# Patient Record
Sex: Female | Born: 1975 | Hispanic: No | Marital: Married | Smoking: Never smoker
Health system: Southern US, Community
[De-identification: ages and names within clinical notes are randomized; demographics above are authoritative.]

## PROBLEM LIST (undated history)

## (undated) ENCOUNTER — Emergency Department (HOSPITAL_COMMUNITY): Admission: EM | Payer: Self-pay | Source: Home / Self Care

## (undated) DIAGNOSIS — E669 Obesity, unspecified: Secondary | ICD-10-CM

## (undated) DIAGNOSIS — I10 Essential (primary) hypertension: Secondary | ICD-10-CM

## (undated) HISTORY — DX: Obesity, unspecified: E66.9

## (undated) HISTORY — PX: TUBAL LIGATION: SHX77

---

## 2001-02-19 ENCOUNTER — Other Ambulatory Visit: Admission: RE | Admit: 2001-02-19 | Discharge: 2001-02-19 | Payer: Self-pay | Admitting: Obstetrics and Gynecology

## 2001-03-11 ENCOUNTER — Other Ambulatory Visit: Admission: RE | Admit: 2001-03-11 | Discharge: 2001-03-11 | Payer: Self-pay | Admitting: Obstetrics and Gynecology

## 2009-08-26 HISTORY — PX: ACHILLES TENDON REPAIR: SUR1153

## 2015-07-05 ENCOUNTER — Encounter: Payer: Self-pay | Admitting: Emergency Medicine

## 2015-07-05 ENCOUNTER — Ambulatory Visit: Admission: EM | Admit: 2015-07-05 | Discharge: 2015-07-05 | Discharge status: Absent without leave | Payer: Self-pay

## 2015-07-05 ENCOUNTER — Ambulatory Visit (INDEPENDENT_AMBULATORY_CARE_PROVIDER_SITE_OTHER): Payer: Federal, State, Local not specified - PPO

## 2015-07-05 ENCOUNTER — Ambulatory Visit
Admission: EM | Admit: 2015-07-05 | Discharge: 2015-07-05 | Disposition: A | Discharge status: Home / Self Care | Payer: Federal, State, Local not specified - PPO | Attending: Family Medicine | Admitting: Family Medicine

## 2015-07-05 DIAGNOSIS — J189 Pneumonia, unspecified organism: Secondary | ICD-10-CM

## 2015-07-05 HISTORY — DX: Essential (primary) hypertension: I10

## 2015-07-05 MED ORDER — AZITHROMYCIN 250 MG PO TABS: ORAL_TABLET | ORAL | Status: DC

## 2015-07-05 MED ORDER — PREDNISONE 20 MG PO TABS
ORAL_TABLET | ORAL | Status: DC
Start: 2015-07-05 — End: 2016-05-06

## 2015-07-05 MED ORDER — HYDROCOD POLST-CPM POLST ER 10-8 MG/5ML PO SUER: 5.0000 mL | Freq: Two times a day (BID) | ORAL | Status: DC

## 2015-07-05 NOTE — Discharge Instructions (Signed)

## 2015-07-05 NOTE — ED Notes (Signed)
Patient states she has had a cough and congestion for more than a month.

## 2015-07-05 NOTE — ED Provider Notes (Signed)
CSN: [id number]     Arrival date & time 07/05/15  [id number] History   First MD Initiated Contact with Patient 07/05/15 1103     Chief Complaint  Patient presents with  . Cough   (Consider location/radiation/quality/duration/timing/severity/associated sxs/prior Treatment) HPI   This a 39 year old female who presents with a 6 week history of cough and congestion. States that he got cough began in early October lasted all October until the present time. Her 7782-month-old daughter was ill with the same but hers has persisted. He has been productive of yellow sputum. She said initially she had fever and chills but now the fever has not been prominent. 3 bottles of Robitussin DM and Alka-Seltzer cold which has provided her any relief. Not having any sinus congestion. Her congestion is all in her chest. The shortness of breath or any wheezing. Is not a smoker.  Past Medical History  Diagnosis Date  . Hypertension    Past Surgical History  Procedure Laterality Date  . Cesarean section     History reviewed. No pertinent family history. Social History  Substance Use Topics  . Smoking status: Never Smoker   . Smokeless tobacco: None  . Alcohol Use: Yes   OB History    No data available     Review of Systems  Constitutional: Positive for fever, chills and activity change. Negative for fatigue.  HENT: Negative for ear pain.   Respiratory: Positive for cough. Negative for shortness of breath, wheezing and stridor.   All other systems reviewed and are negative.   Allergies  Review of patient's allergies indicates not on file.  Home Medications   Prior to Admission medications   Medication Sig Start Date End Date Taking? Authorizing Provider  labetalol (NORMODYNE) 100 MG tablet Take 100 mg by mouth 2 (two) times daily.   Yes Historical Provider, MD  azithromycin (ZITHROMAX Z-PAK) 250 MG tablet Use as per package instructions 07/05/15   Lutricia FeilWilliam P Chiann Goffredo, PA-C  chlorpheniramine-HYDROcodone  [facility](TUSSIONEX PENNKINETIC ER) 10-8 MG/5ML SUER Take 5 mLs by mouth 2 (two) times daily. 07/05/15   Lutricia FeilWilliam P Lacosta Hargan, PA-C  predniSONE (DELTASONE) 20 MG tablet Take 2 tablets daily 07/05/15   Lutricia FeilWilliam P Yaminah Clayborn, PA-C   Meds Ordered and Administered this Visit  Medications - No data to display  BP 134/84 mmHg  Pulse 93  Temp(Src) 97.2 F (36.2 C) (Tympanic)  Resp 18  Ht 5\' 5"  (1.651 m)  Wt 240 lb (108.863 kg)  BMI 39.94 kg/m2  SpO2 99%  LMP 06/27/2015 (Exact Date) No data found.   Physical Exam  Constitutional: She is oriented to person, place, and time. She appears well-developed and well-nourished. No distress.  HENT:  Head: Normocephalic and atraumatic.  Nose: Nose normal.  Mouth/Throat: Oropharynx is clear and moist. No oropharyngeal exudate.  Both TMs are dull  Eyes: Pupils are equal, round, and reactive to light. Right eye exhibits no discharge. Left eye exhibits no discharge.  Neck: Neck supple.  Pulmonary/Chest: No respiratory distress. She has no wheezes. She has rales.  Examination of lungs shows good air entry and movement.. Is little central area of crackles in the right middle lobe. This is non-tussive. There is no rhonchi or wheezing present.  Musculoskeletal: Normal range of motion. She exhibits no edema or tenderness.  Lymphadenopathy:    She has no cervical adenopathy.  Neurological: She is alert and oriented to person, place, and time.  Skin: Skin is warm and dry. She is not diaphoretic.  Psychiatric: She has  a normal mood and affect. Her behavior is normal. Judgment and thought content normal.  Vitals reviewed.   ED Course  Procedures (including critical care time)  Labs Review Labs Reviewed - No data to display  Imaging Review Dg Chest 2 View  07/05/2015  CLINICAL DATA:  Productive cough for 1 month with right middle lobe crackles on exam EXAM: CHEST  2 VIEW COMPARISON:  None. FINDINGS: Normal heart size. Normal mediastinal contour. No pneumothorax. No  pleural effusion. No pulmonary edema. There is focal patchy opacity overlying the posterior heart on the lateral view, favor a mild right lower lobe opacity. IMPRESSION: Mild patchy right lower lobe opacity, which could indicate a mild pneumonia. Recommend follow-up PA and lateral post treatment chest radiographs in 6-8 weeks . Electronically Signed   By: Delbert Phenix M.D.   On: 07/05/2015 12:15     Visual Acuity Review  Right Eye Distance:   Left Eye Distance:   Bilateral Distance:    Right Eye Near:   Left Eye Near:    Bilateral Near:         MDM   1. Community acquired pneumonia    Discharge Medication List as of 07/05/2015 12:31 PM    START taking these medications   Details  azithromycin (ZITHROMAX Z-PAK) 250 MG tablet Use as per package instructions, Print    chlorpheniramine-HYDROcodone (TUSSIONEX PENNKINETIC ER) 10-8 MG/5ML SUER Take 5 mLs by mouth 2 (two) times daily., Starting 07/05/2015, Until Discontinued, Print    predniSONE (DELTASONE) 20 MG tablet Take 2 tablets daily, Print      Plan: 1. Test/x-ray results and diagnosis reviewed with patient 2. rx as per orders; risks, benefits, potential side effects reviewed with patient 3. Recommend supportive treatment with rest fluids 4. F/u with her PCP 6-8 weeks for repeat chest x-ray.  Lutricia Feil, PA-C 07/05/15 1237

## 2015-12-07 DIAGNOSIS — O10912 Unspecified pre-existing hypertension complicating pregnancy, second trimester: Secondary | ICD-10-CM | POA: Diagnosis not present

## 2015-12-07 DIAGNOSIS — O3412 Maternal care for benign tumor of corpus uteri, second trimester: Secondary | ICD-10-CM | POA: Diagnosis not present

## 2015-12-07 DIAGNOSIS — D259 Leiomyoma of uterus, unspecified: Secondary | ICD-10-CM | POA: Diagnosis not present

## 2015-12-07 DIAGNOSIS — O09522 Supervision of elderly multigravida, second trimester: Secondary | ICD-10-CM | POA: Diagnosis not present

## 2016-01-29 DIAGNOSIS — K080 Exfoliation of teeth due to systemic causes: Secondary | ICD-10-CM | POA: Diagnosis not present

## 2016-02-01 DIAGNOSIS — Z23 Encounter for immunization: Secondary | ICD-10-CM | POA: Diagnosis not present

## 2016-02-01 DIAGNOSIS — Z36 Encounter for antenatal screening of mother: Secondary | ICD-10-CM | POA: Diagnosis not present

## 2016-03-04 DIAGNOSIS — O358XX1 Maternal care for other (suspected) fetal abnormality and damage, fetus 1: Secondary | ICD-10-CM | POA: Diagnosis not present

## 2016-03-04 DIAGNOSIS — O09523 Supervision of elderly multigravida, third trimester: Secondary | ICD-10-CM | POA: Diagnosis not present

## 2016-03-04 DIAGNOSIS — O99213 Obesity complicating pregnancy, third trimester: Secondary | ICD-10-CM | POA: Diagnosis not present

## 2016-03-14 DIAGNOSIS — O10913 Unspecified pre-existing hypertension complicating pregnancy, third trimester: Secondary | ICD-10-CM | POA: Diagnosis not present

## 2016-03-21 DIAGNOSIS — O10913 Unspecified pre-existing hypertension complicating pregnancy, third trimester: Secondary | ICD-10-CM | POA: Diagnosis not present

## 2016-03-28 DIAGNOSIS — O10913 Unspecified pre-existing hypertension complicating pregnancy, third trimester: Secondary | ICD-10-CM | POA: Diagnosis not present

## 2016-03-28 DIAGNOSIS — Z36 Encounter for antenatal screening of mother: Secondary | ICD-10-CM | POA: Diagnosis not present

## 2016-04-04 DIAGNOSIS — Z36 Encounter for antenatal screening of mother: Secondary | ICD-10-CM | POA: Diagnosis not present

## 2016-04-04 DIAGNOSIS — O09523 Supervision of elderly multigravida, third trimester: Secondary | ICD-10-CM | POA: Diagnosis not present

## 2016-04-08 DIAGNOSIS — O99213 Obesity complicating pregnancy, third trimester: Secondary | ICD-10-CM | POA: Diagnosis not present

## 2016-04-08 DIAGNOSIS — O26893 Other specified pregnancy related conditions, third trimester: Secondary | ICD-10-CM | POA: Diagnosis not present

## 2016-04-08 DIAGNOSIS — O34219 Maternal care for unspecified type scar from previous cesarean delivery: Secondary | ICD-10-CM | POA: Diagnosis not present

## 2016-04-08 DIAGNOSIS — Z3A36 36 weeks gestation of pregnancy: Secondary | ICD-10-CM | POA: Diagnosis not present

## 2016-04-08 DIAGNOSIS — O368931 Maternal care for other specified fetal problems, third trimester, fetus 1: Secondary | ICD-10-CM | POA: Diagnosis not present

## 2016-04-08 DIAGNOSIS — O10913 Unspecified pre-existing hypertension complicating pregnancy, third trimester: Secondary | ICD-10-CM | POA: Diagnosis not present

## 2016-04-08 DIAGNOSIS — R109 Unspecified abdominal pain: Secondary | ICD-10-CM | POA: Diagnosis not present

## 2016-04-08 DIAGNOSIS — E669 Obesity, unspecified: Secondary | ICD-10-CM | POA: Diagnosis not present

## 2016-04-08 DIAGNOSIS — O09523 Supervision of elderly multigravida, third trimester: Secondary | ICD-10-CM | POA: Diagnosis not present

## 2016-04-08 DIAGNOSIS — Z6841 Body Mass Index (BMI) 40.0 and over, adult: Secondary | ICD-10-CM | POA: Diagnosis not present

## 2016-04-11 DIAGNOSIS — O10913 Unspecified pre-existing hypertension complicating pregnancy, third trimester: Secondary | ICD-10-CM | POA: Diagnosis not present

## 2016-04-15 DIAGNOSIS — O10913 Unspecified pre-existing hypertension complicating pregnancy, third trimester: Secondary | ICD-10-CM | POA: Diagnosis not present

## 2016-04-18 DIAGNOSIS — O10913 Unspecified pre-existing hypertension complicating pregnancy, third trimester: Secondary | ICD-10-CM | POA: Diagnosis not present

## 2016-04-23 DIAGNOSIS — Z302 Encounter for sterilization: Secondary | ICD-10-CM | POA: Diagnosis not present

## 2016-04-23 DIAGNOSIS — O09523 Supervision of elderly multigravida, third trimester: Secondary | ICD-10-CM | POA: Diagnosis not present

## 2016-04-23 DIAGNOSIS — R109 Unspecified abdominal pain: Secondary | ICD-10-CM | POA: Diagnosis not present

## 2016-04-23 DIAGNOSIS — Z79899 Other long term (current) drug therapy: Secondary | ICD-10-CM | POA: Diagnosis not present

## 2016-04-23 DIAGNOSIS — Z809 Family history of malignant neoplasm, unspecified: Secondary | ICD-10-CM | POA: Diagnosis not present

## 2016-04-23 DIAGNOSIS — Z833 Family history of diabetes mellitus: Secondary | ICD-10-CM | POA: Diagnosis not present

## 2016-04-23 DIAGNOSIS — Z3A38 38 weeks gestation of pregnancy: Secondary | ICD-10-CM | POA: Diagnosis not present

## 2016-04-23 DIAGNOSIS — Z7982 Long term (current) use of aspirin: Secondary | ICD-10-CM | POA: Diagnosis not present

## 2016-04-23 DIAGNOSIS — G8918 Other acute postprocedural pain: Secondary | ICD-10-CM | POA: Diagnosis not present

## 2016-04-23 DIAGNOSIS — Z803 Family history of malignant neoplasm of breast: Secondary | ICD-10-CM | POA: Diagnosis not present

## 2016-04-23 DIAGNOSIS — Z800 Family history of malignant neoplasm of digestive organs: Secondary | ICD-10-CM | POA: Diagnosis not present

## 2016-04-23 DIAGNOSIS — Z79891 Long term (current) use of opiate analgesic: Secondary | ICD-10-CM | POA: Diagnosis not present

## 2016-04-23 DIAGNOSIS — Z3A39 39 weeks gestation of pregnancy: Secondary | ICD-10-CM | POA: Diagnosis not present

## 2016-04-23 DIAGNOSIS — O99214 Obesity complicating childbirth: Secondary | ICD-10-CM | POA: Diagnosis not present

## 2016-04-23 DIAGNOSIS — Z8619 Personal history of other infectious and parasitic diseases: Secondary | ICD-10-CM | POA: Diagnosis not present

## 2016-04-23 DIAGNOSIS — O34211 Maternal care for low transverse scar from previous cesarean delivery: Secondary | ICD-10-CM | POA: Diagnosis not present

## 2016-04-23 DIAGNOSIS — Z8742 Personal history of other diseases of the female genital tract: Secondary | ICD-10-CM | POA: Diagnosis not present

## 2016-04-23 DIAGNOSIS — Z23 Encounter for immunization: Secondary | ICD-10-CM | POA: Diagnosis not present

## 2016-04-23 DIAGNOSIS — L813 Cafe au lait spots: Secondary | ICD-10-CM | POA: Diagnosis not present

## 2016-04-23 DIAGNOSIS — Z8249 Family history of ischemic heart disease and other diseases of the circulatory system: Secondary | ICD-10-CM | POA: Diagnosis not present

## 2016-04-23 DIAGNOSIS — O10913 Unspecified pre-existing hypertension complicating pregnancy, third trimester: Secondary | ICD-10-CM | POA: Diagnosis not present

## 2016-04-23 DIAGNOSIS — O1092 Unspecified pre-existing hypertension complicating childbirth: Secondary | ICD-10-CM | POA: Diagnosis not present

## 2016-04-25 DIAGNOSIS — Z302 Encounter for sterilization: Secondary | ICD-10-CM | POA: Diagnosis not present

## 2016-04-25 DIAGNOSIS — O10913 Unspecified pre-existing hypertension complicating pregnancy, third trimester: Secondary | ICD-10-CM | POA: Diagnosis not present

## 2016-04-25 DIAGNOSIS — O09523 Supervision of elderly multigravida, third trimester: Secondary | ICD-10-CM | POA: Diagnosis not present

## 2016-04-25 DIAGNOSIS — O34211 Maternal care for low transverse scar from previous cesarean delivery: Secondary | ICD-10-CM | POA: Diagnosis not present

## 2016-04-25 DIAGNOSIS — Z3A39 39 weeks gestation of pregnancy: Secondary | ICD-10-CM | POA: Diagnosis not present

## 2016-04-25 DIAGNOSIS — O99214 Obesity complicating childbirth: Secondary | ICD-10-CM | POA: Diagnosis not present

## 2016-04-26 DIAGNOSIS — G8918 Other acute postprocedural pain: Secondary | ICD-10-CM | POA: Diagnosis not present

## 2016-04-26 DIAGNOSIS — O1092 Unspecified pre-existing hypertension complicating childbirth: Secondary | ICD-10-CM | POA: Diagnosis not present

## 2016-04-26 DIAGNOSIS — O34211 Maternal care for low transverse scar from previous cesarean delivery: Secondary | ICD-10-CM | POA: Diagnosis not present

## 2016-04-26 DIAGNOSIS — Z8619 Personal history of other infectious and parasitic diseases: Secondary | ICD-10-CM | POA: Diagnosis not present

## 2016-04-26 DIAGNOSIS — R109 Unspecified abdominal pain: Secondary | ICD-10-CM | POA: Diagnosis not present

## 2016-05-02 DIAGNOSIS — M7989 Other specified soft tissue disorders: Secondary | ICD-10-CM | POA: Diagnosis not present

## 2016-05-02 DIAGNOSIS — Z9889 Other specified postprocedural states: Secondary | ICD-10-CM | POA: Diagnosis not present

## 2016-05-02 DIAGNOSIS — O99215 Obesity complicating the puerperium: Secondary | ICD-10-CM | POA: Diagnosis not present

## 2016-05-02 DIAGNOSIS — R51 Headache: Secondary | ICD-10-CM | POA: Diagnosis not present

## 2016-05-02 DIAGNOSIS — Z79899 Other long term (current) drug therapy: Secondary | ICD-10-CM | POA: Diagnosis not present

## 2016-05-02 DIAGNOSIS — O34219 Maternal care for unspecified type scar from previous cesarean delivery: Secondary | ICD-10-CM | POA: Diagnosis not present

## 2016-05-02 DIAGNOSIS — O152 Eclampsia in the puerperium: Secondary | ICD-10-CM | POA: Diagnosis not present

## 2016-05-02 DIAGNOSIS — I10 Essential (primary) hypertension: Secondary | ICD-10-CM | POA: Diagnosis not present

## 2016-05-02 DIAGNOSIS — O1415 Severe pre-eclampsia, complicating the puerperium: Secondary | ICD-10-CM | POA: Diagnosis not present

## 2016-05-02 DIAGNOSIS — I159 Secondary hypertension, unspecified: Secondary | ICD-10-CM | POA: Diagnosis not present

## 2016-05-03 DIAGNOSIS — O34219 Maternal care for unspecified type scar from previous cesarean delivery: Secondary | ICD-10-CM | POA: Diagnosis not present

## 2016-05-03 DIAGNOSIS — O1415 Severe pre-eclampsia, complicating the puerperium: Secondary | ICD-10-CM | POA: Diagnosis not present

## 2016-05-03 DIAGNOSIS — O99215 Obesity complicating the puerperium: Secondary | ICD-10-CM | POA: Diagnosis not present

## 2016-05-03 DIAGNOSIS — I10 Essential (primary) hypertension: Secondary | ICD-10-CM | POA: Diagnosis not present

## 2016-05-05 DIAGNOSIS — R030 Elevated blood-pressure reading, without diagnosis of hypertension: Secondary | ICD-10-CM | POA: Diagnosis not present

## 2016-05-06 ENCOUNTER — Emergency Department (HOSPITAL_COMMUNITY)
Admission: EM | Admit: 2016-05-06 | Discharge: 2016-05-06 | Disposition: A | Discharge status: Home / Self Care | Payer: Federal, State, Local not specified - PPO | Attending: Emergency Medicine | Admitting: Emergency Medicine

## 2016-05-06 ENCOUNTER — Encounter (HOSPITAL_COMMUNITY): Payer: Self-pay | Admitting: *Deleted

## 2016-05-06 DIAGNOSIS — R791 Abnormal coagulation profile: Secondary | ICD-10-CM | POA: Diagnosis not present

## 2016-05-06 DIAGNOSIS — Z79899 Other long term (current) drug therapy: Secondary | ICD-10-CM | POA: Insufficient documentation

## 2016-05-06 DIAGNOSIS — O165 Unspecified maternal hypertension, complicating the puerperium: Secondary | ICD-10-CM | POA: Insufficient documentation

## 2016-05-06 DIAGNOSIS — I10 Essential (primary) hypertension: Secondary | ICD-10-CM | POA: Diagnosis not present

## 2016-05-06 LAB — URINALYSIS, ROUTINE W REFLEX MICROSCOPIC
Bilirubin Urine: NEGATIVE
GLUCOSE, UA: NEGATIVE mg/dL
Ketones, ur: NEGATIVE mg/dL
Nitrite: NEGATIVE
PROTEIN: NEGATIVE mg/dL
SPECIFIC GRAVITY, URINE: 1.012 (ref 1.005–1.030)
pH: 6 (ref 5.0–8.0)

## 2016-05-06 LAB — CBC WITH DIFFERENTIAL/PLATELET
BASOS PCT: 0 %
Basophils Absolute: 0 10*3/uL (ref 0.0–0.1)
EOS ABS: 0.2 10*3/uL (ref 0.0–0.7)
Eosinophils Relative: 2 %
HCT: 32 % — ABNORMAL LOW (ref 36.0–46.0)
HEMOGLOBIN: 10.1 g/dL — AB (ref 12.0–15.0)
Lymphocytes Relative: 31 %
Lymphs Abs: 2.6 10*3/uL (ref 0.7–4.0)
MCH: 28.1 pg (ref 26.0–34.0)
MCHC: 31.6 g/dL (ref 30.0–36.0)
MCV: 88.9 fL (ref 78.0–100.0)
Monocytes Absolute: 0.6 10*3/uL (ref 0.1–1.0)
Monocytes Relative: 7 %
NEUTROS PCT: 60 %
Neutro Abs: 5.1 10*3/uL (ref 1.7–7.7)
Platelets: 446 10*3/uL — ABNORMAL HIGH (ref 150–400)
RBC: 3.6 MIL/uL — AB (ref 3.87–5.11)
RDW: 14.8 % (ref 11.5–15.5)
WBC: 8.5 10*3/uL (ref 4.0–10.5)

## 2016-05-06 LAB — PROTEIN / CREATININE RATIO, URINE
CREATININE, URINE: 52.83 mg/dL
Protein Creatinine Ratio: 0.11 mg/mg{Cre} (ref 0.00–0.15)
Total Protein, Urine: 6 mg/dL

## 2016-05-06 LAB — URINE MICROSCOPIC-ADD ON

## 2016-05-06 LAB — PROTIME-INR
INR: 1.11
PROTHROMBIN TIME: 14.4 s (ref 11.4–15.2)

## 2016-05-06 LAB — COMPREHENSIVE METABOLIC PANEL
ALK PHOS: 75 U/L (ref 38–126)
ALT: 23 U/L (ref 14–54)
AST: 21 U/L (ref 15–41)
Albumin: 3.1 g/dL — ABNORMAL LOW (ref 3.5–5.0)
Anion gap: 7 (ref 5–15)
BUN: 15 mg/dL (ref 6–20)
CALCIUM: 8.7 mg/dL — AB (ref 8.9–10.3)
CO2: 22 mmol/L (ref 22–32)
CREATININE: 0.82 mg/dL (ref 0.44–1.00)
Chloride: 110 mmol/L (ref 101–111)
Glucose, Bld: 96 mg/dL (ref 65–99)
Potassium: 4.2 mmol/L (ref 3.5–5.1)
Sodium: 139 mmol/L (ref 135–145)
Total Bilirubin: 0.5 mg/dL (ref 0.3–1.2)
Total Protein: 6.2 g/dL — ABNORMAL LOW (ref 6.5–8.1)

## 2016-05-06 LAB — LACTATE DEHYDROGENASE: LDH: 203 U/L — AB (ref 98–192)

## 2016-05-06 LAB — APTT: APTT: 29 s (ref 24–36)

## 2016-05-06 MED ORDER — AMLODIPINE BESYLATE 10 MG PO TABS
10.0000 mg | ORAL_TABLET | Freq: Every day | ORAL | 0 refills | Status: DC
Start: 2016-05-06 — End: 2018-07-09

## 2016-05-06 MED ORDER — LABETALOL HCL 200 MG PO TABS
200.0000 mg | ORAL_TABLET | Freq: Once | ORAL | Status: AC
  Administered 2016-05-06: 200 mg via ORAL
  Filled 2016-05-06: qty 1

## 2016-05-06 MED ORDER — AMLODIPINE BESYLATE 5 MG PO TABS
10.0000 mg | ORAL_TABLET | Freq: Once | ORAL | Status: AC
  Administered 2016-05-06: 10 mg via ORAL
  Filled 2016-05-06: qty 2

## 2016-05-06 MED ORDER — HYDRALAZINE HCL 20 MG/ML IJ SOLN
5.0000 mg | Freq: Once | INTRAMUSCULAR | Status: AC
  Administered 2016-05-06: 5 mg via INTRAVENOUS
  Filled 2016-05-06: qty 1

## 2016-05-06 NOTE — Discharge Instructions (Signed)
The OB on call, Dr Emelda FearFerguson, wants to add Norvasc to your blood pressure regimen. Recheck if you get a headache, chest pain, get short of breath or get swelling in your legs.  Try to get a primary care doctor, until then [place]

## 2016-05-06 NOTE — ED Provider Notes (Signed)
[facility]-EMERGENCY DEPT Provider Note   CSN: [id number] Arrival date & time: 05/06/16  0100  By signing my name below, I, Nelwyn SalisburyJoshua Fowler, attest that this documentation has been prepared under the direction and in the presence of Devoria AlbeIva Anastacio Bua, MD . Electronically Signed: Nelwyn SalisburyJoshua Fowler, Scribe. 05/06/2016. 1:14 AM.  Time seen 01:12 AM  History   Chief Complaint Chief Complaint  Patient presents with  . Hypertension   The history is provided by the patient. No language interpreter was used.     HPI Comments:  Mack HookLaquetta D Jones Bigelow is a 40 y.o. female who presents to the Emergency Department complaining of chronic  hypertension that was well controlled during her first pregnancy and her second pregnancy..Pt reports that she gave birth two weeks ago (04/25/2016) by c-section and everything was okay. She was discharged home with her baby and Sept 7  began experiencing severe headaches  She checked her BP and it was high at 170/106 and she had protein in her urine and she was admitted for pre-eclampsia post delivery.  She was treated with IV magnesium from Friday 12 AM through Saturday morning AM (24 hrs). Her labetolol was increased from 100 mg BID to 200 mg BID and her BP was better in the hospital at 140-150 systolic over 75, which was higher than her baseline.  Pt was discharged yesterday, Sept 10  around 1pm. Pt reports that she came home, relaxed, and took her labetalol at about 9pm. She then took her blood pressure and found it to be 190/106. Pt called EMS to confirm the high BP before coming into the ED.  She denies any current headache, CP, SOB or leg swelling. Pt reports she has taken motrin with no relief.  PCP OOT OB Dr Margart SicklesNetasha McLawhorn [phone] [facility]  Past Medical History:  Diagnosis Date  . Hypertension     There are no active problems to display for this patient.   Past Surgical History:  Procedure Laterality Date  . CESAREAN SECTION      OB History    No  data available       Home Medications    Prior to Admission medications   Medication Sig Start Date End Date Taking? Authorizing Provider  Influenza vac split quadrivalent PF (FLUARIX) 0.5 ML injection Inject 0.5 mLs into the muscle once.   Yes Historical Provider, MD  labetalol (NORMODYNE) 100 MG tablet Take 200 mg by mouth 2 (two) times daily. 05/05/16  Yes Historical Provider, MD  naproxen (NAPROSYN) 500 MG tablet Take 500 mg by mouth 2 (two) times daily. 05/05/16  Yes Historical Provider, MD  Prenatal Multivit-Min-Fe-FA (PRENATAL VITAMINS) 0.8 MG tablet Take 1 tablet by mouth daily.   Yes Historical Provider, MD  amLODipine (NORVASC) 10 MG tablet Take 1 tablet (10 mg total) by mouth daily. 05/06/16   Devoria AlbeIva Yachet Mattson, MD    Family History No family history on file.  Social History Social History  Substance Use Topics  . Smoking status: Never Smoker  . Smokeless tobacco: Never Used  . Alcohol use Yes  lives with spouse   Allergies   Review of patient's allergies indicates no known allergies.   Review of Systems Review of Systems  Respiratory: Negative for shortness of breath.   Cardiovascular: Negative for chest pain and leg swelling.       Positive for Hypertension  Neurological: Negative for headaches (Resolved).  All other systems reviewed and are negative.    Physical Exam Updated Vital  Signs BP 168/95 (BP Location: Right Arm)   Pulse 68   Temp 98.2 F (36.8 C) (Oral)   Resp 16   SpO2 99%   Vital signs normal except for hypertension    Physical Exam  Constitutional: She is oriented to person, place, and time. She appears well-developed and well-nourished.  Non-toxic appearance. She does not appear ill. No distress.  HENT:  Head: Normocephalic and atraumatic.  Right Ear: External ear normal.  Left Ear: External ear normal.  Nose: Nose normal. No mucosal edema or rhinorrhea.  Mouth/Throat: Oropharynx is clear and moist and mucous membranes are normal. No  dental abscesses or uvula swelling.  Eyes: Conjunctivae and EOM are normal. Pupils are equal, round, and reactive to light.  Neck: Normal range of motion and full passive range of motion without pain. Neck supple.  Cardiovascular: Normal rate, regular rhythm and normal heart sounds.  Exam reveals no gallop and no friction rub.   No murmur heard. Pulmonary/Chest: Effort normal and breath sounds normal. No respiratory distress. She has no wheezes. She has no rhonchi. She has no rales. She exhibits no tenderness and no crepitus.  Abdominal: Soft. Normal appearance and bowel sounds are normal. She exhibits no distension. There is no tenderness. There is no rebound and no guarding.  Musculoskeletal: Normal range of motion. She exhibits no edema or tenderness.  Moves all extremities well.   Neurological: She is alert and oriented to person, place, and time. She has normal strength. No cranial nerve deficit.  Skin: Skin is warm, dry and intact. No rash noted. No erythema. No pallor.  Psychiatric: She has a normal mood and affect. Her speech is normal and behavior is normal. Her mood appears not anxious.  Nursing note and vitals reviewed.    ED Treatments / Results  Labs (all labs ordered are listed, but only abnormal results are displayed) Results for orders placed or performed during the hospital encounter of 05/06/16  Urinalysis, Routine w reflex microscopic  Result Value Ref Range   Color, Urine YELLOW YELLOW   APPearance CLEAR CLEAR   Specific Gravity, Urine 1.012 1.005 - 1.030   pH 6.0 5.0 - 8.0   Glucose, UA NEGATIVE NEGATIVE mg/dL   Hgb urine dipstick LARGE (A) NEGATIVE   Bilirubin Urine NEGATIVE NEGATIVE   Ketones, ur NEGATIVE NEGATIVE mg/dL   Protein, ur NEGATIVE NEGATIVE mg/dL   Nitrite NEGATIVE NEGATIVE   Leukocytes, UA SMALL (A) NEGATIVE  Comprehensive metabolic panel  Result Value Ref Range   Sodium 139 135 - 145 mmol/L   Potassium 4.2 3.5 - 5.1 mmol/L   Chloride 110 101 -  111 mmol/L   CO2 22 22 - 32 mmol/L   Glucose, Bld 96 65 - 99 mg/dL   BUN 15 6 - 20 mg/dL   Creatinine, Ser 1.61 0.44 - 1.00 mg/dL   Calcium 8.7 (L) 8.9 - 10.3 mg/dL   Total Protein 6.2 (L) 6.5 - 8.1 g/dL   Albumin 3.1 (L) 3.5 - 5.0 g/dL   AST 21 15 - 41 U/L   ALT 23 14 - 54 U/L   Alkaline Phosphatase 75 38 - 126 U/L   Total Bilirubin 0.5 0.3 - 1.2 mg/dL   GFR calc non Af Amer >60 >60 mL/min   GFR calc Af Amer >60 >60 mL/min   Anion gap 7 5 - 15  CBC with Differential  Result Value Ref Range   WBC 8.5 4.0 - 10.5 K/uL   RBC 3.60 (L) 3.87 -  5.11 MIL/uL   Hemoglobin 10.1 (L) 12.0 - 15.0 g/dL   HCT 16.1 (L) 09.6 - 04.5 %   MCV 88.9 78.0 - 100.0 fL   MCH 28.1 26.0 - 34.0 pg   MCHC 31.6 30.0 - 36.0 g/dL   RDW 40.9 81.1 - 91.4 %   Platelets 446 (H) 150 - 400 K/uL   Neutrophils Relative % 60 %   Neutro Abs 5.1 1.7 - 7.7 K/uL   Lymphocytes Relative 31 %   Lymphs Abs 2.6 0.7 - 4.0 K/uL   Monocytes Relative 7 %   Monocytes Absolute 0.6 0.1 - 1.0 K/uL   Eosinophils Relative 2 %   Eosinophils Absolute 0.2 0.0 - 0.7 K/uL   Basophils Relative 0 %   Basophils Absolute 0.0 0.0 - 0.1 K/uL  Lactate dehydrogenase  Result Value Ref Range   LDH 203 (H) 98 - 192 U/L  Protime-INR  Result Value Ref Range   Prothrombin Time 14.4 11.4 - 15.2 seconds   INR 1.11   APTT  Result Value Ref Range   aPTT 29 24 - 36 seconds  Protein / creatinine ratio, urine  Result Value Ref Range   Creatinine, Urine 52.83 mg/dL   Total Protein, Urine 6 mg/dL   Protein Creatinine Ratio 0.11 0.00 - 0.15 mg/mg[Cre]  Urine microscopic-add on  Result Value Ref Range   Squamous Epithelial / LPF 0-5 (A) NONE SEEN   WBC, UA 0-5 0 - 5 WBC/hpf   RBC / HPF 6-30 0 - 5 RBC/hpf   Bacteria, UA RARE (A) NONE SEEN   Laboratory interpretation all normal except mild anemia   Radiology No results found.  Procedures Procedures (including critical care time)  Medications Ordered in ED Medications  hydrALAZINE  (APRESOLINE) injection 5 mg (5 mg Intravenous Given 05/06/16 0230)  labetalol (NORMODYNE) tablet 200 mg (200 mg Oral Given 05/06/16 0230)  amLODipine (NORVASC) tablet 10 mg (10 mg Oral Given 05/06/16 0408)     Initial Impression / Assessment and Plan / ED Course  I have reviewed the triage vital signs and the nursing notes.  Pertinent labs & imaging results that were available during my care of the patient were reviewed by me and considered in my medical decision making (see chart for details).  Clinical Course   1:27 AM Discussed treatment plan with pt at bedside and pt agreed to plan.  1:39AM Spoke to doctor [place], [place] on call. He reccommends to not give IV magnesium at this time. States to give her hydralazine 5mg  IV and an additional labetalol 200mg  orally. Call back when lab results are in.   3:40 Am: Checked in with patient and discussed consult with Dr. Emelda Fear, BP was found to be 156/91 at this time  3:48 AM Discussed lab test results and blood pressure again with Dr. Emelda Fear. He states she can go home, and add Norvasc 10mg  to her BP regimen. At this point we do not believe she is preeclamptic.  Final Clinical Impressions(s) / ED Diagnoses   Final diagnoses:  Essential hypertension    New Prescriptions Discharge Medication List as of 05/06/2016  3:56 AM    START taking these medications   Details  amLODipine (NORVASC) 10 MG tablet Take 1 tablet (10 mg total) by mouth daily., Starting Mon 05/06/2016, Print       Plan discharge  Devoria Albe, MD, FACEP   I personally performed the services described in this documentation, which was scribed in my presence. The recorded information has  been reviewed and considered.  Devoria Albe, MD, Concha Pyo, MD 05/06/16 [phone]

## 2016-05-06 NOTE — ED Triage Notes (Signed)
The pt arrived by [facility] from home. She delivered her baby 2 weeks ago she had bp problems.  She was just discharged from a hospital in [place] earlier today for eclampsia  They doubled her labetalol when she left the hospital

## 2016-05-08 DIAGNOSIS — I10 Essential (primary) hypertension: Secondary | ICD-10-CM | POA: Diagnosis not present

## 2016-06-17 DIAGNOSIS — K080 Exfoliation of teeth due to systemic causes: Secondary | ICD-10-CM | POA: Diagnosis not present

## 2016-08-28 DIAGNOSIS — I10 Essential (primary) hypertension: Secondary | ICD-10-CM | POA: Diagnosis not present

## 2016-08-28 DIAGNOSIS — R0982 Postnasal drip: Secondary | ICD-10-CM | POA: Diagnosis not present

## 2016-08-28 DIAGNOSIS — R197 Diarrhea, unspecified: Secondary | ICD-10-CM | POA: Diagnosis not present

## 2016-08-28 DIAGNOSIS — R509 Fever, unspecified: Secondary | ICD-10-CM | POA: Diagnosis not present

## 2016-09-10 DIAGNOSIS — R509 Fever, unspecified: Secondary | ICD-10-CM | POA: Diagnosis not present

## 2016-09-10 DIAGNOSIS — E559 Vitamin D deficiency, unspecified: Secondary | ICD-10-CM | POA: Diagnosis not present

## 2016-09-10 DIAGNOSIS — Z79899 Other long term (current) drug therapy: Secondary | ICD-10-CM | POA: Diagnosis not present

## 2016-09-10 DIAGNOSIS — R197 Diarrhea, unspecified: Secondary | ICD-10-CM | POA: Diagnosis not present

## 2016-09-10 DIAGNOSIS — R0982 Postnasal drip: Secondary | ICD-10-CM | POA: Diagnosis not present

## 2016-09-10 DIAGNOSIS — I10 Essential (primary) hypertension: Secondary | ICD-10-CM | POA: Diagnosis not present

## 2016-10-03 DIAGNOSIS — I10 Essential (primary) hypertension: Secondary | ICD-10-CM | POA: Diagnosis not present

## 2016-10-03 DIAGNOSIS — Z79899 Other long term (current) drug therapy: Secondary | ICD-10-CM | POA: Diagnosis not present

## 2016-10-03 DIAGNOSIS — Z6841 Body Mass Index (BMI) 40.0 and over, adult: Secondary | ICD-10-CM | POA: Diagnosis not present

## 2016-10-03 DIAGNOSIS — E559 Vitamin D deficiency, unspecified: Secondary | ICD-10-CM | POA: Diagnosis not present

## 2016-11-28 DIAGNOSIS — R202 Paresthesia of skin: Secondary | ICD-10-CM | POA: Diagnosis not present

## 2016-12-26 ENCOUNTER — Ambulatory Visit (INDEPENDENT_AMBULATORY_CARE_PROVIDER_SITE_OTHER): Payer: Federal, State, Local not specified - PPO | Admitting: Diagnostic Neuroimaging

## 2016-12-26 ENCOUNTER — Encounter (INDEPENDENT_AMBULATORY_CARE_PROVIDER_SITE_OTHER): Payer: Self-pay

## 2016-12-26 DIAGNOSIS — R200 Anesthesia of skin: Secondary | ICD-10-CM

## 2016-12-26 DIAGNOSIS — Z0289 Encounter for other administrative examinations: Secondary | ICD-10-CM

## 2016-12-27 DIAGNOSIS — I10 Essential (primary) hypertension: Secondary | ICD-10-CM | POA: Diagnosis not present

## 2016-12-27 DIAGNOSIS — Z6841 Body Mass Index (BMI) 40.0 and over, adult: Secondary | ICD-10-CM | POA: Diagnosis not present

## 2016-12-27 NOTE — Procedures (Signed)
[facility]  NCS (NERVE CONDUCTION STUDY) WITH EMG (ELECTROMYOGRAPHY) REPORT   STUDY DATE: 12/26/16 PATIENT NAME: Mack HookLaquetta D Jones Bigelow DOB: 02/17/1976 MRN: [id number]  ORDERING CLINICIAN: Maggie Font Sanders, MD  TECHNOLOGIST: Charlesetta IvoryBeau Handy ELECTROMYOGRAPHER: Glenford BayleyVikram R. Bralon Antkowiak, MD  CLINICAL INFORMATION: 41 year old female with bilateral hand numbness x 2-3 months.  FINDINGS: NERVE CONDUCTION STUDY: Bilateral median and right ulnar motor responses are normal.  Bilateral median sensory responses have prolonged peak latencies and decreased amplitudes.  Bilateral median to ulnar transcarpal comparison studies show prolonged peak latency difference (right 0.9 ms, left 1.4 ms, normal less than 0.4 ms).  Bilateral ulnar sensory responses are normal.    NEEDLE ELECTROMYOGRAPHY: Needle examination of left upper extremity deltoid, biceps, triceps, flexor carpi radialis, first dorsal interosseous shows no abnormal spontaneous activity at rest and normal motor unit recruitment on exertion.    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    INTERPRETING PHYSICIAN:  Suanne MarkerVIKRAM R. Shavaun Osterloh, MD Certified in Neurology, Neurophysiology and Neuroimaging  [facility] [address], [address] [place], [place] [address] [phone]  [facility]    Nerve / Sites Muscle Latency Ref. Amplitude Ref. Rel Amp Segments Distance Velocity Ref. Area    ms ms mV mV %  cm m/s m/s mVms  R Median - APB     Wrist APB 4.2 ?4.4 14.3 ?4.0 100 Wrist - APB 7   46.8     Upper arm APB 7.8  14.3  99.7 Upper arm - Wrist 20 56 ?49 46.6  L Median - APB     Wrist APB 4.2 ?4.4 12.5 ?4.0 100 Wrist - APB 7   36.1     Upper arm APB 7.9  11.5  91.8 Upper arm - Wrist 20 55 ?49 32.8  R Ulnar - ADM     Wrist ADM 2.4 ?3.3 11.6 ?6.0 100 Wrist - ADM 7   38.8     B.Elbow ADM 5.3  10.8  93.2  B.Elbow - Wrist 17 60 ?49 36.4     A.Elbow ADM 7.4  10.6  97.9 A.Elbow - B.Elbow 12 56 ?49 36.5         A.Elbow - Wrist               SNC    Nerve / Sites Rec. Site Peak Lat Ref.  Amp Ref. Segments Distance Peak Diff Ref.    ms ms V V  cm ms ms  R Median, Ulnar - Transcarpal comparison     Median Palm Wrist 2.9 ?2.2 19 ?35 Median Palm - Wrist 8       Ulnar Palm Wrist 2.0 ?2.2 20 ?12 Ulnar Palm - Wrist 8          Median Palm - Ulnar Palm  0.9 ?0.4  L Median, Ulnar - Transcarpal comparison     Median Palm Wrist 3.4 ?2.2 12 ?35 Median Palm - Wrist 8       Ulnar Palm Wrist 2.0 ?2.2 13 ?12 Ulnar Palm - Wrist 8          Median Palm - Ulnar Palm  1.4 ?0.4  R Median - Orthodromic (Dig II, Mid palm)     Dig II Wrist 3.9 ?3.4 5 ?10 Dig II - Wrist 13    L Median - Orthodromic (Dig II, Mid palm)     Dig II Wrist 4.0 ?3.4 7 ?10 Dig  II - Wrist 13    R Ulnar - Orthodromic, (Dig V, Mid palm)     Dig V Wrist 2.7 ?3.1 6 ?5 Dig V - Wrist 11    L Ulnar - Orthodromic, (Dig V, Mid palm)     Dig V Wrist 2.7 ?3.1 9 ?5 Dig V - Wrist 22                   F  Wave    Nerve F Lat Ref.   ms ms  R Ulnar - ADM 26.0 ?32.0       EMG full       EMG Summary Table    Spontaneous MUAP Recruitment  Muscle IA Fib PSW Fasc Other Amp Dur. Poly Pattern  L. Deltoid Normal None None None _______ Normal Normal Normal Normal  L. Biceps brachii Normal None None None _______ Normal Normal Normal Normal  L. Triceps brachii Normal None None None _______ Normal Normal Normal Normal  L. Flexor carpi radialis Normal None None None _______ Normal Normal Normal Normal  L. First dorsal interosseous Normal None None None _______ Normal Normal Normal Normal

## 2017-01-24 ENCOUNTER — Encounter (HOSPITAL_COMMUNITY): Payer: Self-pay | Admitting: Emergency Medicine

## 2017-01-24 ENCOUNTER — Emergency Department (HOSPITAL_COMMUNITY)
Admission: EM | Admit: 2017-01-24 | Discharge: 2017-01-24 | Disposition: A | Discharge status: Home / Self Care | Payer: Federal, State, Local not specified - PPO | Attending: Physician Assistant | Admitting: Physician Assistant

## 2017-01-24 ENCOUNTER — Emergency Department (HOSPITAL_COMMUNITY): Payer: Federal, State, Local not specified - PPO

## 2017-01-24 DIAGNOSIS — Z79899 Other long term (current) drug therapy: Secondary | ICD-10-CM | POA: Insufficient documentation

## 2017-01-24 DIAGNOSIS — R079 Chest pain, unspecified: Secondary | ICD-10-CM | POA: Diagnosis not present

## 2017-01-24 DIAGNOSIS — I10 Essential (primary) hypertension: Secondary | ICD-10-CM | POA: Insufficient documentation

## 2017-01-24 DIAGNOSIS — R0789 Other chest pain: Secondary | ICD-10-CM | POA: Diagnosis not present

## 2017-01-24 LAB — CBC
HEMATOCRIT: 36.6 % (ref 36.0–46.0)
HEMOGLOBIN: 12.5 g/dL (ref 12.0–15.0)
MCH: 31.1 pg (ref 26.0–34.0)
MCHC: 34.2 g/dL (ref 30.0–36.0)
MCV: 91 fL (ref 78.0–100.0)
Platelets: 307 10*3/uL (ref 150–400)
RBC: 4.02 MIL/uL (ref 3.87–5.11)
RDW: 13 % (ref 11.5–15.5)
WBC: 5.8 10*3/uL (ref 4.0–10.5)

## 2017-01-24 LAB — I-STAT TROPONIN, ED
Troponin i, poc: 0 ng/mL (ref 0.00–0.08)
Troponin i, poc: 0 ng/mL (ref 0.00–0.08)

## 2017-01-24 LAB — BASIC METABOLIC PANEL
ANION GAP: 7 (ref 5–15)
BUN: 9 mg/dL (ref 6–20)
CHLORIDE: 106 mmol/L (ref 101–111)
CO2: 22 mmol/L (ref 22–32)
Calcium: 9.1 mg/dL (ref 8.9–10.3)
Creatinine, Ser: 0.87 mg/dL (ref 0.44–1.00)
GFR calc non Af Amer: >60 mL/min (ref >60)
GLUCOSE: 118 mg/dL — AB (ref 65–99)
Potassium: 3.6 mmol/L (ref 3.5–5.1)
Sodium: 135 mmol/L (ref 135–145)

## 2017-01-24 MED ORDER — ASPIRIN 81 MG PO CHEW
324.0000 mg | CHEWABLE_TABLET | Freq: Once | ORAL | Status: AC
Start: 2017-01-24 — End: 2017-01-24
  Administered 2017-01-24: 324 mg via ORAL
  Filled 2017-01-24: qty 4

## 2017-01-24 NOTE — ED Triage Notes (Signed)
Pt reports central chest pressure that sometimes radiates to left arm, denies sob. Pt a/ox4, resp e/u, speaking in full sentences, nad.

## 2017-01-24 NOTE — ED Notes (Signed)
Pt ambulatory with steady gat. Pulse ox remained at 96% to 98% on RA while ambulatory. Denies dizziness or difficulty walking. Shawn, PA aware.

## 2017-01-24 NOTE — ED Notes (Signed)
Patient denies pain and is resting comfortably.  

## 2017-01-24 NOTE — Discharge Instructions (Signed)
You were seen today for chest pain. Your lab work was reassuring. Please follow up with cardiology as soon as possible. Call the number provided to set up an appointment. Add a 81 mg aspirin to your daily medication regimen. Return to the ED should symptoms worsen or for any other concerns.

## 2017-01-24 NOTE — ED Provider Notes (Signed)
[facility]-EMER[facility]ENCY DEPT Provider Note   CSN: [id number] Arrival date & time: 01/24/17  1137     History   Chief Complaint Chief Complaint  Patient presents with  . Chest Pain    HPI Maria Bryan is a 41 y.o. female.  HPI   Maria Bryan is a 41 y.o. female, with a history of HTN, presenting to the ED with chest pain intermittent for the last several weeks. Pain is pressure "like something sitting on my chest," central chest, radiating to left chest and left arm, lasts for about an hour at a time.  Does not seem to be connect with exertion. Occasional shortness of breath. Has not had this type of pain before, but states the closest thing to it is her anxiety attacks she used to get years ago. Has a 65 month old son, but not breast feeding. Has been under a large amount of stress at home, work, and with a recent death of an aunt.  Denies fever/chills, N/V/D, cough, current shortness of breath, dizziness, or any other complaints.   Denies history of DVT/PE, cancer, recent trauma, surgery, or immobilization.   Past Medical History:  Diagnosis Date  . Hypertension     There are no active problems to display for this patient.   Past Surgical History:  Procedure Laterality Date  . CESAREAN SECTION      OB History    No data available       Home Medications    Prior to Admission medications   Medication Sig Start Date End Date Taking? Authorizing Provider  amLODipine (NORVASC) 10 MG tablet Take 1 tablet (10 mg total) by mouth daily. 05/06/16  Yes Devoria Albe, MD  labetalol (NORMODYNE) 100 MG tablet Take 100 mg by mouth daily.  05/05/16  Yes [provider]  naproxen (NAPROSYN) 500 MG tablet Take 500 mg by mouth 2 (two) times daily. 05/05/16  Yes [provider]  Influenza vac split quadrivalent PF (FLUARIX) 0.5 ML injection Inject 0.5 mLs into the muscle once.    [provider]  Prenatal Multivit-Min-Fe-FA (PRENATAL VITAMINS) 0.8  MG tablet Take 1 tablet by mouth daily.    [provider]    Family History No family history on file.  Social History Social History  Substance Use Topics  . Smoking status: Never Smoker  . Smokeless tobacco: Never Used  . Alcohol use Yes     Allergies   Patient has no known allergies.   Review of Systems Review of Systems  Constitutional: Negative for chills and fever.  Respiratory: Positive for shortness of breath. Negative for cough.   Cardiovascular: Positive for chest pain.  Gastrointestinal: Negative for abdominal pain, diarrhea, nausea and vomiting.  Neurological: Negative for dizziness and light-headedness.  All other systems reviewed and are negative.    Physical Exam Updated Vital Signs BP 109/84   Pulse 87   Temp 98.1 F (36.7 C) (Oral)   Resp 16   LMP 01/24/2017 (Exact Date)   SpO2 97%   Physical Exam  Constitutional: She appears well-developed and well-nourished. No distress.  HENT:  Head: Normocephalic and atraumatic.  Eyes: Conjunctivae are normal.  Neck: Neck supple.  Cardiovascular: Normal rate, regular rhythm, normal heart sounds and intact distal pulses.   Pulmonary/Chest: Effort normal and breath sounds normal. No respiratory distress.  Abdominal: Soft. There is no tenderness. There is no guarding.  Musculoskeletal: She exhibits no edema.  Lymphadenopathy:    She has no cervical  adenopathy.  Neurological: She is alert.  Skin: Skin is warm and dry. She is not diaphoretic.  Psychiatric: She has a normal mood and affect. Her behavior is normal.  Nursing note and vitals reviewed.    ED Treatments / Results  Labs (all labs ordered are listed, but only abnormal results are displayed) Labs Reviewed  BASIC METABOLIC PANEL - Abnormal; Notable for the following:       Result Value   Glucose, Bld 118 (*)    All other components within normal limits  CBC  I-STAT TROPOININ, ED  I-STAT TROPOININ, ED    EKG  EKG  Interpretation  Date/Time:  Friday January 24 2017 11:43:02 EDT Ventricular Rate:  87 PR Interval:  132 QRS Duration: 80 QT Interval:  372 QTC Calculation: 447 R Axis:   65 Text Interpretation:  Normal sinus rhythm Reconfirmed by Corlis LeakMackuen, Courteney ([id number]) on 01/24/2017 1:05:39 PM       Radiology Dg Chest 2 View  Result Date: 01/24/2017 CLINICAL DATA:  Patient with centralized chest pressure radiating to the left arm. EXAM: CHEST  2 VIEW COMPARISON:  Chest radiograph 07/05/2015 FINDINGS: Monitoring leads overlie the patient. Prominent cardiac and mediastinal contours. No consolidative pulmonary opacities. No pleural effusion or pneumothorax. Regional skeleton is unremarkable. IMPRESSION: No acute cardiopulmonary process. Electronically Signed   By: Annia Beltrew  Davis M.D.   On: 01/24/2017 12:37    Procedures Procedures (including critical care time)  Medications Ordered in ED Medications  aspirin chewable tablet 324 mg (324 mg Oral Given 01/24/17 1306)     Initial Impression / Assessment and Plan / ED Course  I have reviewed the triage vital signs and the nursing notes.  Pertinent labs & imaging results that were available during my care of the patient were reviewed by me and considered in my medical decision making (see chart for details).     Patient presents with intermittent chest pain for several weeks. Is not associated with exertion or stressful events.  Patient is low risk for ACS. Her HEART score is 1 and is PERC negative. Pain-free throughout ED course. Ambulatory without assistance, difficulty, or recurrence of symptoms. Cardiology follow-up. The patient was given instructions for home care as well as return precautions. Patient voices understanding of these instructions, accepts the plan, and is comfortable with discharge.  Findings and plan of care discussed with Bary Castillaourteney Mackuen, MD.   Vitals:   01/24/17 1142 01/24/17 1215 01/24/17 1521  BP: 109/84 97/67 122/70  Pulse: 87  80 71  Resp: 16 20 14   Temp: 98.1 F (36.7 C)    TempSrc: Oral    SpO2: 97% 94% 96%      Final Clinical Impressions(s) / ED Diagnoses   Final diagnoses:  Chest pain, unspecified type    New Prescriptions Discharge Medication List as of 01/24/2017  3:56 PM       Anselm PancoastJoy, Felicie Kocher C, PA-C 01/25/17 1143    Mackuen, Cindee Saltourteney Lyn, MD 01/27/17 1104

## 2017-02-14 ENCOUNTER — Ambulatory Visit: Payer: Federal, State, Local not specified - PPO | Admitting: Cardiovascular Disease

## 2017-02-25 ENCOUNTER — Encounter: Payer: Self-pay | Admitting: *Deleted

## 2017-02-27 DIAGNOSIS — L700 Acne vulgaris: Secondary | ICD-10-CM | POA: Diagnosis not present

## 2017-02-27 DIAGNOSIS — L219 Seborrheic dermatitis, unspecified: Secondary | ICD-10-CM | POA: Diagnosis not present

## 2017-03-11 ENCOUNTER — Ambulatory Visit: Payer: Federal, State, Local not specified - PPO | Admitting: Interventional Cardiology

## 2017-04-01 DIAGNOSIS — Z1231 Encounter for screening mammogram for malignant neoplasm of breast: Secondary | ICD-10-CM | POA: Diagnosis not present

## 2017-04-18 ENCOUNTER — Ambulatory Visit: Payer: Federal, State, Local not specified - PPO | Admitting: Interventional Cardiology

## 2017-04-22 DIAGNOSIS — M7712 Lateral epicondylitis, left elbow: Secondary | ICD-10-CM | POA: Diagnosis not present

## 2017-04-22 DIAGNOSIS — Z0000 Encounter for general adult medical examination without abnormal findings: Secondary | ICD-10-CM | POA: Diagnosis not present

## 2017-04-22 DIAGNOSIS — I10 Essential (primary) hypertension: Secondary | ICD-10-CM | POA: Diagnosis not present

## 2017-04-22 DIAGNOSIS — Z1389 Encounter for screening for other disorder: Secondary | ICD-10-CM | POA: Diagnosis not present

## 2017-04-22 DIAGNOSIS — G5603 Carpal tunnel syndrome, bilateral upper limbs: Secondary | ICD-10-CM | POA: Diagnosis not present

## 2017-05-26 DIAGNOSIS — G5603 Carpal tunnel syndrome, bilateral upper limbs: Secondary | ICD-10-CM | POA: Diagnosis not present

## 2017-05-26 DIAGNOSIS — G5602 Carpal tunnel syndrome, left upper limb: Secondary | ICD-10-CM | POA: Diagnosis not present

## 2017-06-13 DIAGNOSIS — Z23 Encounter for immunization: Secondary | ICD-10-CM | POA: Diagnosis not present

## 2017-07-08 DIAGNOSIS — M25512 Pain in left shoulder: Secondary | ICD-10-CM | POA: Diagnosis not present

## 2017-07-08 DIAGNOSIS — M50821 Other cervical disc disorders at C4-C5 level: Secondary | ICD-10-CM | POA: Diagnosis not present

## 2017-07-10 DIAGNOSIS — L700 Acne vulgaris: Secondary | ICD-10-CM | POA: Diagnosis not present

## 2017-07-10 DIAGNOSIS — L219 Seborrheic dermatitis, unspecified: Secondary | ICD-10-CM | POA: Diagnosis not present

## 2017-07-14 DIAGNOSIS — I10 Essential (primary) hypertension: Secondary | ICD-10-CM | POA: Diagnosis not present

## 2017-07-14 DIAGNOSIS — Z6841 Body Mass Index (BMI) 40.0 and over, adult: Secondary | ICD-10-CM | POA: Diagnosis not present

## 2017-07-14 DIAGNOSIS — M542 Cervicalgia: Secondary | ICD-10-CM | POA: Diagnosis not present

## 2017-07-24 DIAGNOSIS — K080 Exfoliation of teeth due to systemic causes: Secondary | ICD-10-CM | POA: Diagnosis not present

## 2017-07-24 DIAGNOSIS — M50821 Other cervical disc disorders at C4-C5 level: Secondary | ICD-10-CM | POA: Diagnosis not present

## 2017-07-31 DIAGNOSIS — M5030 Other cervical disc degeneration, unspecified cervical region: Secondary | ICD-10-CM | POA: Diagnosis not present

## 2017-08-09 DIAGNOSIS — Z6841 Body Mass Index (BMI) 40.0 and over, adult: Secondary | ICD-10-CM | POA: Diagnosis not present

## 2017-08-09 DIAGNOSIS — R635 Abnormal weight gain: Secondary | ICD-10-CM | POA: Diagnosis not present

## 2017-08-09 DIAGNOSIS — M549 Dorsalgia, unspecified: Secondary | ICD-10-CM | POA: Diagnosis not present

## 2017-08-09 DIAGNOSIS — I10 Essential (primary) hypertension: Secondary | ICD-10-CM | POA: Diagnosis not present

## 2017-08-13 DIAGNOSIS — M50821 Other cervical disc disorders at C4-C5 level: Secondary | ICD-10-CM | POA: Diagnosis not present

## 2017-08-13 DIAGNOSIS — M7542 Impingement syndrome of left shoulder: Secondary | ICD-10-CM | POA: Diagnosis not present

## 2017-08-13 DIAGNOSIS — M25512 Pain in left shoulder: Secondary | ICD-10-CM | POA: Diagnosis not present

## 2017-08-14 DIAGNOSIS — M5030 Other cervical disc degeneration, unspecified cervical region: Secondary | ICD-10-CM | POA: Diagnosis not present

## 2017-08-21 DIAGNOSIS — M5030 Other cervical disc degeneration, unspecified cervical region: Secondary | ICD-10-CM | POA: Diagnosis not present

## 2017-09-02 DIAGNOSIS — I10 Essential (primary) hypertension: Secondary | ICD-10-CM | POA: Diagnosis not present

## 2017-09-02 DIAGNOSIS — Z6841 Body Mass Index (BMI) 40.0 and over, adult: Secondary | ICD-10-CM | POA: Diagnosis not present

## 2017-09-02 DIAGNOSIS — N760 Acute vaginitis: Secondary | ICD-10-CM | POA: Diagnosis not present

## 2017-09-02 IMAGING — CR DG CHEST 2V
2 series · 2 of 2 positions shown · non-contrast
Comparison: None.

CLINICAL DATA: Productive cough for 1 month with right middle lobe
crackles on exam

EXAM:
CHEST  2 VIEW

[chest pa]
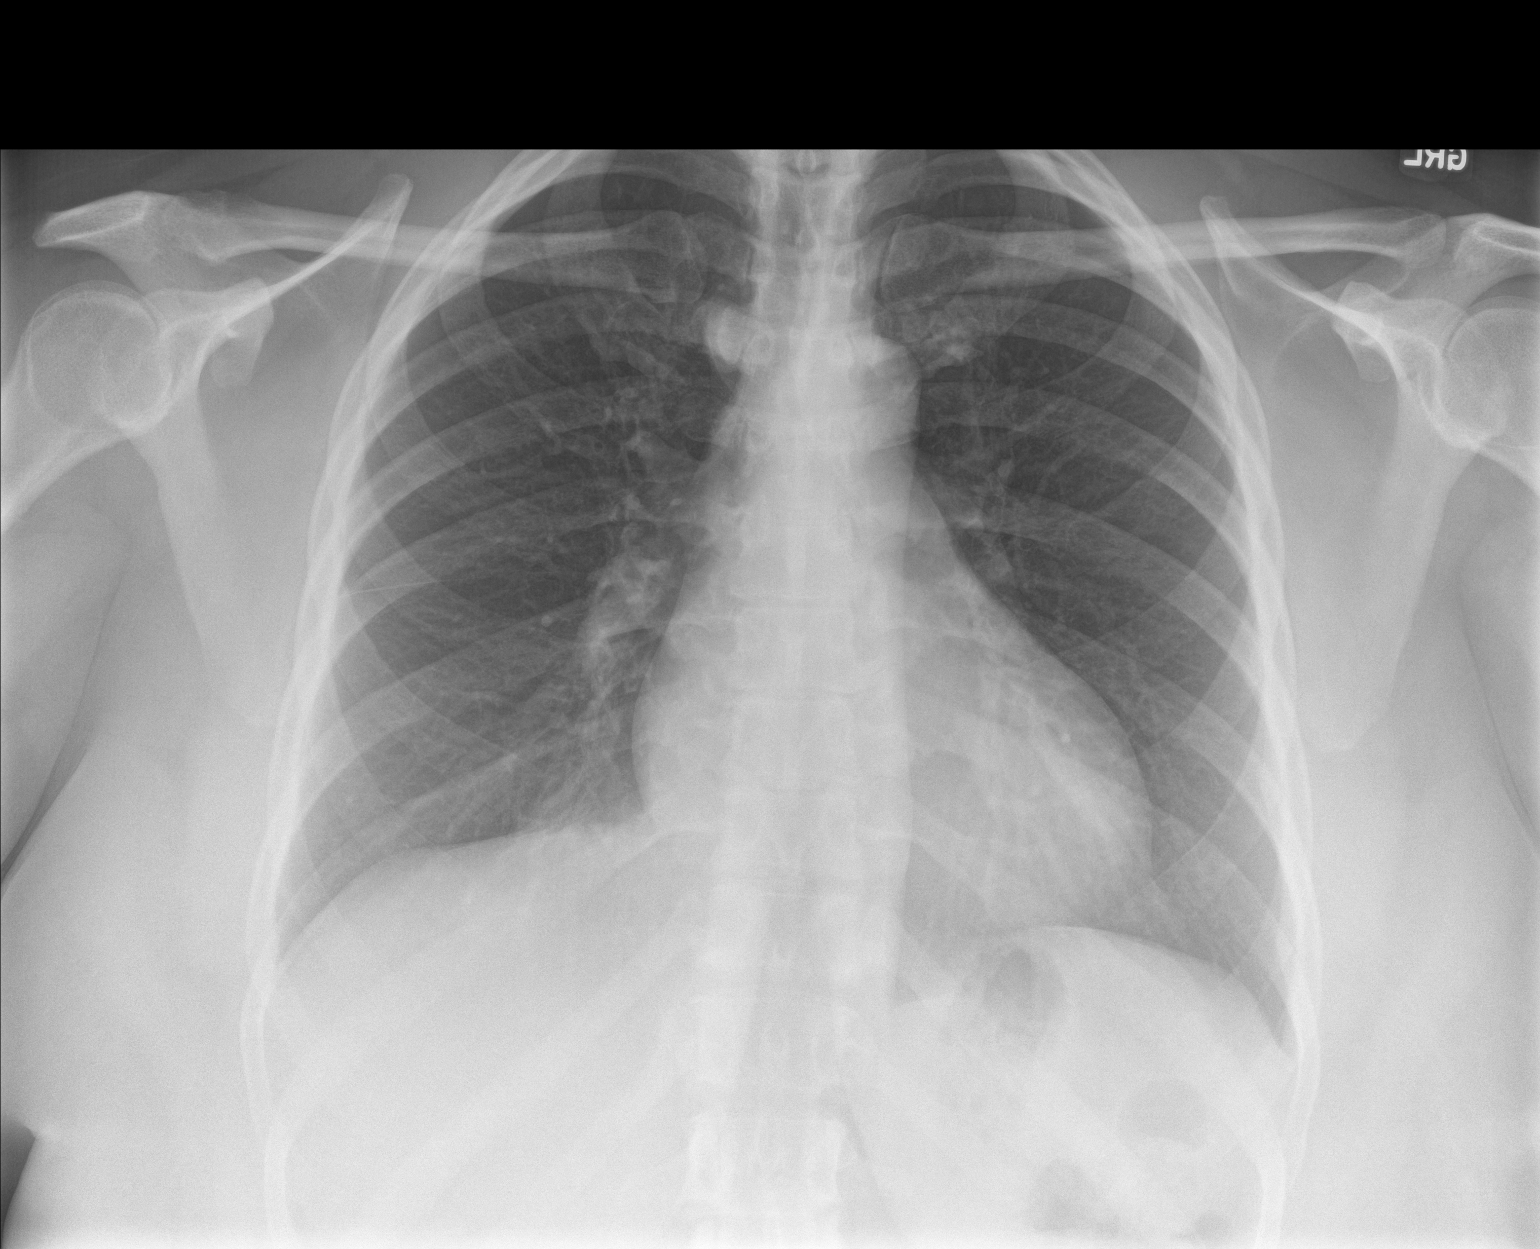

[chest lat]
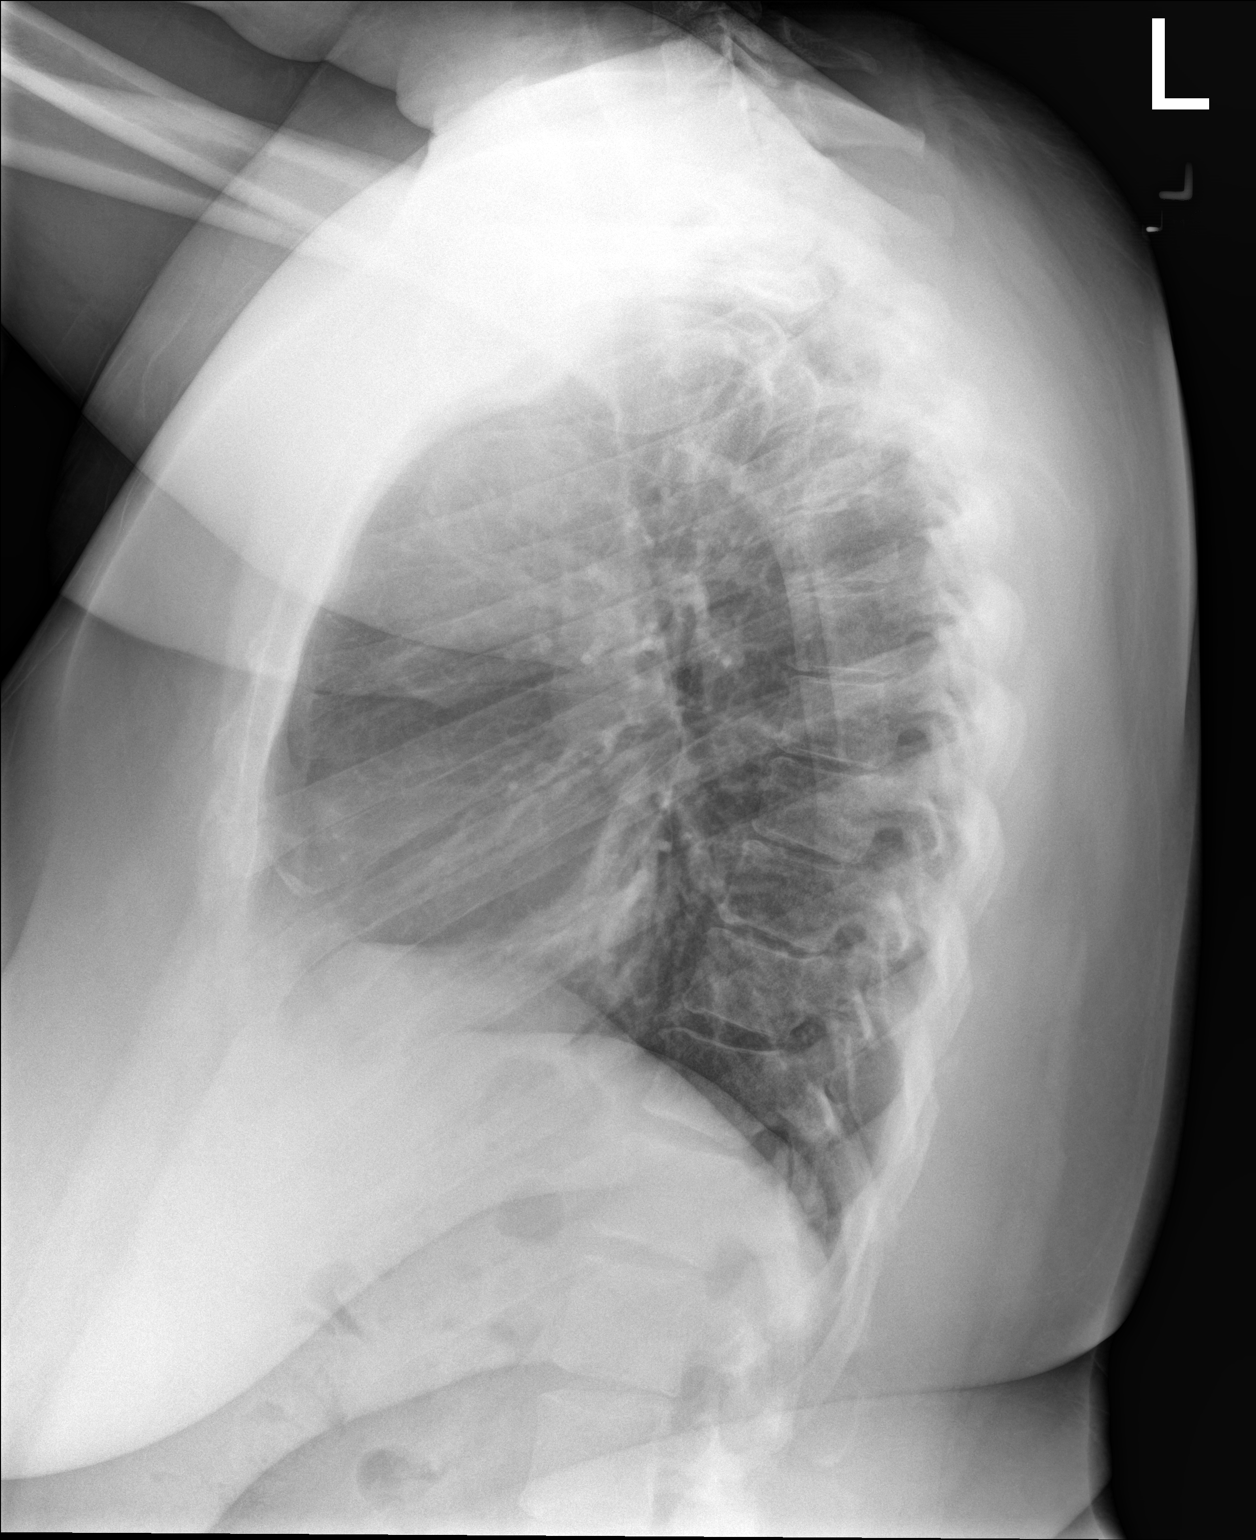

[2 of 2 positions shown; findings below may reference images not displayed]

FINDINGS: Normal heart size. Normal mediastinal contour. No pneumothorax. No
pleural effusion. No pulmonary edema. There is focal patchy opacity
overlying the posterior heart on the lateral view, favor a mild
right lower lobe opacity.
IMPRESSION: Mild patchy right lower lobe opacity, which could indicate a mild
pneumonia. Recommend follow-up PA and lateral post treatment chest
radiographs in 6-8 weeks .

## 2017-09-22 DIAGNOSIS — M25512 Pain in left shoulder: Secondary | ICD-10-CM | POA: Diagnosis not present

## 2017-09-22 DIAGNOSIS — M2241 Chondromalacia patellae, right knee: Secondary | ICD-10-CM | POA: Diagnosis not present

## 2017-09-22 DIAGNOSIS — M25561 Pain in right knee: Secondary | ICD-10-CM | POA: Diagnosis not present

## 2017-10-28 DIAGNOSIS — R946 Abnormal results of thyroid function studies: Secondary | ICD-10-CM | POA: Diagnosis not present

## 2017-10-28 DIAGNOSIS — R0683 Snoring: Secondary | ICD-10-CM | POA: Diagnosis not present

## 2017-10-28 DIAGNOSIS — Z79899 Other long term (current) drug therapy: Secondary | ICD-10-CM | POA: Diagnosis not present

## 2017-10-28 DIAGNOSIS — I10 Essential (primary) hypertension: Secondary | ICD-10-CM | POA: Diagnosis not present

## 2017-10-28 DIAGNOSIS — R002 Palpitations: Secondary | ICD-10-CM | POA: Diagnosis not present

## 2017-10-29 ENCOUNTER — Encounter: Payer: Self-pay | Admitting: Cardiovascular Disease

## 2017-11-04 ENCOUNTER — Ambulatory Visit: Payer: Federal, State, Local not specified - PPO | Admitting: Cardiovascular Disease

## 2017-11-04 ENCOUNTER — Encounter: Payer: Self-pay | Admitting: Cardiovascular Disease

## 2017-11-04 VITALS — BP 110/82 | HR 82 | Ht 65.0 in | Wt 267.0 lb

## 2017-11-04 DIAGNOSIS — R002 Palpitations: Secondary | ICD-10-CM

## 2017-11-04 DIAGNOSIS — I10 Essential (primary) hypertension: Secondary | ICD-10-CM | POA: Diagnosis not present

## 2017-11-04 DIAGNOSIS — R0789 Other chest pain: Secondary | ICD-10-CM | POA: Diagnosis not present

## 2017-11-04 NOTE — Progress Notes (Signed)
Cardiology Office Note   Date:  11/04/2017   ID:  Maria Bryan, DOB Mar 02, 1976, MRN [id number]  PCP:  Dorothyann Peng, MD  Cardiologist:   Chilton Si, MD   Chief Complaint  Patient presents with  . Follow-up  . Palpitations  . Shortness of Breath  . Chest Pain    Left side.    History of Present Illness: Maria Bryan is a 42 y.o. female wih hypertension who is being seen today for the evaluation of palpitations at the request of Dorothyann Peng, MD.  Ms. Dejanee Thibeaux saw Dr. Allyne Gee on 10/2017 and noted palpitations.  For the last 4 weeks she has noted palpitations that awakened her from sleep.  She felt like her heart was beating out of her chest.  She then has a hard time falling back asleep.  She had recently stopped phenteramine.  She didn't have palpitations while takign it.  The palpitations started 2 days after she stopped it.  She since started having palpitations during the day.  The episodes last for approximately 20 minutes and are not associated with shortness of breath.  However she notes that when she ambulates while having the palpitation she is more short of breath and tired than usual.  They are also associated with substernal and left-sided chest pain and left arm discomfort.  She denies exertional chest pain or pressure.  Her heart rate is in the 90s-100s on her Fitbit when this occurs.  Of note she reports that she has not been sleeping well.  She has 2 children under the age of 3 who frequently sleep in the bed with her and wake up often.  She has also been under a lot of stress at work.  She works for [employer] and her team of 6 is gone down to a team of 1.  Therefore her workload has been very heavy lately.  She wonders if this could be contributing.  Since last Sunday she has not had any recurrent episodes.  Prior to that it was occurring almost daily.  Dr. Allyne Gee has ordered a sleep study that is currently pending.  She notes  that she snores but her husband has not observed any apneic episodes.  She has occasional lightheadedness but no syncope.  At appointment magnesium was low normal, and her thyroid and other electrolytes were within normal limits.    She had a similar episodes last year and went to the ED.  She was instructed to follow-up with cardiology but did not do so at that time.  She does not get any formal exercise.  She denies lower extremity edema, orthopnea, or PND.   Past Medical History:  Diagnosis Date  . Hypertension     Past Surgical History:  Procedure Laterality Date  . CESAREAN SECTION       Current Outpatient Medications  Medication Sig Dispense Refill  . BIOTIN PO Take 10,000 Units by mouth daily.    . Cholecalciferol (VITAMIN D3) 2000 units TABS Take 2,000 Units by mouth daily.    . cyanocobalamin 500 MCG tablet Take 500 mcg by mouth daily.    . Dapsone (ACZONE) 7.5 % GEL Aczone 7.5 % topical gel with pump    . DICLOFENAC PO Take by mouth.    . fluocinolone (SYNALAR) 0.025 % ointment fluocinolone 0.01 % scalp oil and shower cap    . hydrocortisone 2.5 % cream hydrocortisone 2.5 % topical cream    . magnesium oxide (  MAG-OX) 400 MG tablet Take 400 mg by mouth daily.    . Multiple Vitamin (MULTIVITAMIN) tablet Take 1 tablet by mouth daily.    Marland Kitchen amLODipine (NORVASC) 10 MG tablet Take 1 tablet (10 mg total) by mouth daily. 30 tablet 0  . labetalol (NORMODYNE) 100 MG tablet Take 200 mg by mouth daily.      No current facility-administered medications for this visit.     Allergies:   Patient has no known allergies.    Social History:  The patient  reports that  has never smoked. she has never used smokeless tobacco. She reports that she drinks alcohol.   Family History:  The patient's family history includes Diabetes in her maternal aunt, maternal aunt, and mother; Hypertension in her father, maternal grandmother, mother, and sister; Peripheral Artery Disease in her mother.     ROS:  Please see the history of present illness.   Otherwise, review of systems are positive for none.   All other systems are reviewed and negative.    PHYSICAL EXAM: VS:  BP 110/82   Pulse 82   Ht 5\' 5"  (1.651 m)   Wt 267 lb (121.1 kg)   BMI 44.43 kg/m  , BMI Body mass index is 44.43 kg/m. GENERAL:  Well appearing HEENT:  Pupils equal round and reactive, fundi not visualized, oral mucosa unremarkable NECK:  No jugular venous distention, waveform within normal limits, carotid upstroke brisk and symmetric, no bruits LUNGS:  Clear to auscultation bilaterally HEART:  RRR.  PMI not displaced or sustained,S1 and S2 within normal limits, no S3, no S4, no clicks, no rubs, no murmurs ABD:  Flat, positive bowel sounds normal in frequency in pitch, no bruits, no rebound, no guarding, no midline pulsatile mass, no hepatomegaly, no splenomegaly EXT:  2 plus pulses throughout, no edema, no cyanosis no clubbing SKIN:  No rashes no nodules NEURO:  Cranial nerves II through XII grossly intact, motor grossly intact throughout PSYCH:  Cognitively intact, oriented to person place and time   EKG:  EKG is ordered today. The ekg ordered today demonstrates sinus rhythm.  Rate 82 bpm.   Recent Labs: 01/24/2017: BUN 9; Creatinine, Ser 0.87; Hemoglobin 12.5; Platelets 307; Potassium 3.6; Sodium 135   10/28/17: Sodium 140, potassium 4.3, BUN 10, creatinine 0.87 TSH 3.49 Magnesium 1.8   Lipid Panel No results found for: CHOL, TRIG, HDL, CHOLHDL, VLDL, LDLCALC, LDLDIRECT    Wt Readings from Last 3 Encounters:  11/04/17 267 lb (121.1 kg)  07/05/15 240 lb (108.9 kg)      ASSESSMENT AND PLAN:  # Palpitations: Symptoms seem stress related.  Labs have been unremarkable.  We will get a 14 day event monitor.  Agree with holding diet supplements for now.   # Obesity:  We discussed the importance of exercising at least 150 min weekly.  She expressed understanding.  Check fasting lipids.  #  Hypertension: Blood pressure is well-controlled.  Continue amlodipine and labetalol.  # Atypical chest pain: Not consistent with ischemia.   Current medicines are reviewed at length with the patient today.  The patient does not have concerns regarding medicines.  The following changes have been made:  no change  Labs/ tests ordered today include:   Orders Placed This Encounter  Procedures  . CARDIAC EVENT MONITOR  . EKG 12-Lead     Disposition:   FU with Zarian Colpitts C. Duke Salvia, MD, [facility] in 1 month    This note was written with the assistance of speech  recognition software.  Please excuse any transcriptional errors.  Signed, Boots Mcglown C. Duke Salviaandolph, MD, [facility] Center[facility]ACC  11/04/2017 4:38 PM    [facility]

## 2017-11-04 NOTE — Patient Instructions (Addendum)
Medication Instructions:  Your physician recommends that you continue on your current medications as directed. Please refer to the Current Medication list given to you today.  Labwork: none  Testing/Procedures: Your physician has recommended that you wear an event monitor. Event monitors are medical devices that record the heart's electrical activity. Doctors most often us these monitors to diagnose arrhythmias. Arrhythmias are problems with the speed or rhythm of the heartbeat. The monitor is a small, portable device. You can wear one while you do your normal daily activities. This is usually used to diagnose what is causing palpitations/syncope (passing out). 14 day  [facility] AT [address]  Follow-Up: Your physician recommends that you schedule a follow-up appointment in: ABOUT 4-8 WEEKS   If you need a refill on your cardiac medications before your next appointment, please call your pharmacy.   Cardiac Event Monitoring A cardiac event monitor is a small recording device that is used to detect abnormal heart rhythms (arrhythmias). The monitor is used to record your heart rhythm when you have symptoms, such as:  Fast heartbeats (palpitations), such as heart racing or fluttering.  Dizziness.  Fainting or light-headedness.  Unexplained weakness.  Some monitors are wired to electrodes placed on your chest. Electrodes are flat, sticky disks that attach to your skin. Other monitors may be hand-held or worn on the wrist. The monitor can be worn for up to 30 days. If the monitor is attached to your chest, a technician will prepare your chest for the electrode placement and show you how to work the monitor. Take time to practice using the monitor before you leave the office. Make sure you understand how to send the information from the monitor to your health care provider. In some cases, you may need to use a landline telephone instead of a cell phone. What are the  risks? Generally, this device is safe to use, but it possible that the skin under the electrodes will become irritated. How to use your cardiac event monitor  Wear your monitor at all times, except when you are in water: ? Do not let the monitor get wet. ? Take the monitor off when you bathe. Do not swim or use a hot tub with it on.  Keep your skin clean. Do not put body lotion or moisturizer on your chest.  Change the electrodes as told by your health care provider or any time they stop sticking to your skin. You may need to use medical tape to keep them on.  Try to put the electrodes in slightly different places on your chest to help prevent skin irritation. They must remain in the area under your left breast and in the upper right section of your chest.  Make sure the monitor is safely clipped to your clothing or in a location close to your body that your health care provider recommends.  Press the button to record as soon as you feel heart-related symptoms, such as: ? Dizziness. ? Weakness. ? Light-headedness. ? Palpitations. ? Thumping or pounding in your chest. ? Shortness of breath. ? Unexplained weakness.  Keep a diary of your activities, such as walking, doing chores, and taking medicine. It is very important to note what you were doing when you pushed the button to record your symptoms. This will help your health care provider determine what might be contributing to your symptoms.  Send the recorded information as recommended by your health care provider. It may take some time for  your health care provider to process the results.  Change the batteries as told by your health care provider.  Keep electronic devices away from your monitor. This includes: ? Tablets. ? MP3 players. ? Cell phones.  While wearing your monitor you should avoid: ? Electric blankets. ? Firefighter. ? Electric toothbrushes. ? Microwave ovens. ? Magnets. ? Metal detectors. Get help right  away if:  You have chest pain.  You have extreme difficulty breathing or shortness of breath.  You develop a very fast heartbeat that persists.  You develop dizziness that does not go away.  You faint or constantly feel like you are about to faint. Summary  A cardiac event monitor is a small recording device that is used to help detect abnormal heart rhythms (arrhythmias).  The monitor is used to record your heart rhythm when you have heart-related symptoms.  Make sure you understand how to send the information from the monitor to your health care provider.  It is important to press the button on the monitor when you have any heart-related symptoms.  Keep a diary of your activities, such as walking, doing chores, and taking medicine. It is very important to note what you were doing when you pushed the button to record your symptoms. This will help your health care provider learn what might be causing your symptoms. This information is not intended to replace advice given to you by your health care provider. Make sure you discuss any questions you have with your health care provider. Document Released: 05/21/2008 Document Revised: 07/27/2016 Document Reviewed: 07/27/2016 Elsevier Interactive Patient Education  2017 [employer].

## 2017-11-12 ENCOUNTER — Ambulatory Visit (INDEPENDENT_AMBULATORY_CARE_PROVIDER_SITE_OTHER): Payer: Federal, State, Local not specified - PPO

## 2017-11-12 DIAGNOSIS — R002 Palpitations: Secondary | ICD-10-CM | POA: Diagnosis not present

## 2017-12-01 ENCOUNTER — Encounter: Payer: Self-pay | Admitting: Cardiovascular Disease

## 2017-12-31 ENCOUNTER — Other Ambulatory Visit: Payer: Self-pay | Admitting: Obstetrics and Gynecology

## 2017-12-31 ENCOUNTER — Other Ambulatory Visit (HOSPITAL_COMMUNITY)
Admission: RE | Admit: 2017-12-31 | Discharge: 2017-12-31 | Disposition: A | Discharge status: Home / Self Care | Payer: Federal, State, Local not specified - PPO | Source: Ambulatory Visit | Attending: Obstetrics and Gynecology | Admitting: Obstetrics and Gynecology

## 2017-12-31 DIAGNOSIS — Z01411 Encounter for gynecological examination (general) (routine) with abnormal findings: Secondary | ICD-10-CM | POA: Diagnosis not present

## 2018-01-01 ENCOUNTER — Encounter: Payer: Self-pay | Admitting: Cardiovascular Disease

## 2018-01-01 ENCOUNTER — Ambulatory Visit: Payer: Federal, State, Local not specified - PPO | Admitting: Cardiovascular Disease

## 2018-01-01 VITALS — BP 108/74 | HR 75 | Ht 65.25 in | Wt 269.0 lb

## 2018-01-01 DIAGNOSIS — E7800 Pure hypercholesterolemia, unspecified: Secondary | ICD-10-CM | POA: Diagnosis not present

## 2018-01-01 DIAGNOSIS — R002 Palpitations: Secondary | ICD-10-CM

## 2018-01-01 DIAGNOSIS — I10 Essential (primary) hypertension: Secondary | ICD-10-CM | POA: Diagnosis not present

## 2018-01-01 NOTE — Progress Notes (Signed)
Cardiology Office Note   Date:  01/01/2018   ID:  Torian Thoennes, DOB 1976/07/11, MRN [id number]  PCP:  Dorothyann Peng, MD  Cardiologist:   Chilton Si, MD   Chief Complaint  Patient presents with  . Follow-up    History of Present Illness: Maria Bryan is a 42 y.o. female wih hypertension here for follow up.  She was initially seen 10/2017 for the evaluation of palpitations.  Ms. Maria Bryan saw Dr. Allyne Gee on 10/2017 and noted palpitations.  For the last 4 weeks she has noted palpitations that awakened her from sleep.  She felt like her heart was beating out of her chest.  She then has a hard time falling back asleep.  She had recently stopped phenteramine.  She didn't have palpitations while taking it.  She was referred for a 14-day event monitor that revealed sinus rhythm and sinus tachycardia in the 110s.  She also had PACs.  Since her last appointment Ms. Maria Bryan has been feeling much better.  She is started walking for exercise several days per week.  She is also been trying to eliminate the stress in her life.  She continues to have stress at work but has told her boss that she is got to set some limitations and boundaries.  She has made some changes in her diet and is limiting carbohydrate intake.  In the past she had bread with every meal.  Now she has bread at most once per day.  She has not had any recurrent palpitations.  She denies any lower extremity edema, orthopnea, or PND.  Her sleep study is pending.    Past Medical History:  Diagnosis Date  . Hypertension     Past Surgical History:  Procedure Laterality Date  . CESAREAN SECTION       Current Outpatient Medications  Medication Sig Dispense Refill  . amLODipine (NORVASC) 10 MG tablet Take 1 tablet (10 mg total) by mouth daily. 30 tablet 0  . BIOTIN PO Take 10,000 Units by mouth daily.    . Cholecalciferol (VITAMIN D3) 2000 units TABS Take 2,000 Units by mouth daily.    .  cyanocobalamin 500 MCG tablet Take 500 mcg by mouth daily.    . Dapsone (ACZONE) 7.5 % GEL Aczone 7.5 % topical gel with pump    . DICLOFENAC PO Take by mouth.    . fluocinolone (SYNALAR) 0.025 % ointment fluocinolone 0.01 % scalp oil and shower cap    . hydrocortisone 2.5 % cream hydrocortisone 2.5 % topical cream    . labetalol (NORMODYNE) 100 MG tablet Take 200 mg by mouth daily.     . magnesium oxide (MAG-OX) 400 MG tablet Take 400 mg by mouth daily.    . Multiple Vitamin (MULTIVITAMIN) tablet Take 1 tablet by mouth daily.     No current facility-administered medications for this visit.     Allergies:   Patient has no known allergies.    Social History:  The patient  reports that she has never smoked. She has never used smokeless tobacco. She reports that she drinks alcohol.   Family History:  The patient's family history includes Diabetes in her maternal aunt, maternal aunt, and mother; Hypertension in her father, maternal grandmother, mother, and sister; Peripheral Artery Disease in her mother.    ROS:  Please see the history of present illness.   Otherwise, review of systems are positive for none.   All other systems are reviewed and  negative.    PHYSICAL EXAM: VS:  BP 108/74   Pulse 75   Ht 5' 5.25" (1.657 m)   Wt 269 lb (122 kg)   BMI 44.42 kg/m  , BMI Body mass index is 44.42 kg/m. GENERAL:  Well appearing HEENT: Pupils equal round and reactive, fundi not visualized, oral mucosa unremarkable NECK:  No jugular venous distention, waveform within normal limits, carotid upstroke brisk and symmetric, no bruits LUNGS:  Clear to auscultation bilaterally HEART:  RRR.  PMI not displaced or sustained,S1 and S2 within normal limits, no S3, no S4, no clicks, no rubs, no murmurs ABD:  Flat, positive bowel sounds normal in frequency in pitch, no bruits, no rebound, no guarding, no midline pulsatile mass, no hepatomegaly, no splenomegaly EXT:  2 plus pulses throughout, no edema, no  cyanosis no clubbing SKIN:  No rashes no nodules NEURO:  Cranial nerves II through XII grossly intact, motor grossly intact throughout PSYCH:  Cognitively intact, oriented to person place and time   EKG:  EKG is not ordered today. The ekg ordered 11/04/17 demonstrates sinus rhythm.  Rate 82 bpm.   14 Day Event Monitor 11/12/17:  Sinus rhythm and sinus tachycardia to the 110s noted PACs noted No significant arrhythmias.  Recent Labs: 01/24/2017: BUN 9; Creatinine, Ser 0.87; Hemoglobin 12.5; Platelets 307; Potassium 3.6; Sodium 135   10/28/17: Sodium 140, potassium 4.3, BUN 10, creatinine 0.87 TSH 3.49 Magnesium 1.8   Lipid Panel No results found for: CHOL, TRIG, HDL, CHOLHDL, VLDL, LDLCALC, LDLDIRECT    Wt Readings from Last 3 Encounters:  01/01/18 269 lb (122 kg)  11/04/17 267 lb (121.1 kg)  07/05/15 240 lb (108.9 kg)      ASSESSMENT AND PLAN:  # Palpitations: Symptoms seem stress related. She also had palpitations and tachycardia at night which may be related to sleep apnea.  Sleep study is pending.  Symptoms have improved with exercising and prioritizing herself.  # Obesity:  Ms. Maria Bryan was congratulated on her efforts to improve her diet and exercise.  # Hypertension: Blood pressure is well-controlled.  Continue amlodipine and labetalol.  # Atypical chest pain: Resolved.    Current medicines are reviewed at length with the patient today.  The patient does not have concerns regarding medicines.  The following changes have been made:  no change  Labs/ tests ordered today include:   No orders of the defined types were placed in this encounter.    Disposition:   FU with Christoher Drudge C. Duke Salvia, MD, [facility] as needed.      Signed, Taziah Difatta C. Duke Salvia, MD, [facility]  01/01/2018 9:31 AM    [facility]

## 2018-01-01 NOTE — Patient Instructions (Signed)
Medication Instructions:  ?Your physician recommends that you continue on your current medications as directed. Please refer to the Current Medication list given to you today.  ? ?Labwork: ?NONE ? ?Testing/Procedures: ?NONE ? ?Follow-Up: ?AS NEEDED  ? ?  ?

## 2018-01-02 LAB — CYTOLOGY - PAP
Adequacy: ABSENT
Diagnosis: NEGATIVE
HPV (WINDOPATH): NOT DETECTED

## 2018-01-06 ENCOUNTER — Encounter: Payer: Self-pay | Admitting: Neurology

## 2018-01-06 ENCOUNTER — Ambulatory Visit: Payer: Federal, State, Local not specified - PPO | Admitting: Neurology

## 2018-01-06 VITALS — BP 124/82 | HR 85 | Ht 65.0 in | Wt 271.0 lb

## 2018-01-06 DIAGNOSIS — G4739 Other sleep apnea: Secondary | ICD-10-CM

## 2018-01-06 DIAGNOSIS — R0683 Snoring: Secondary | ICD-10-CM | POA: Diagnosis not present

## 2018-01-06 DIAGNOSIS — R946 Abnormal results of thyroid function studies: Secondary | ICD-10-CM | POA: Diagnosis not present

## 2018-01-06 DIAGNOSIS — I10 Essential (primary) hypertension: Secondary | ICD-10-CM | POA: Diagnosis not present

## 2018-01-06 DIAGNOSIS — R002 Palpitations: Secondary | ICD-10-CM | POA: Diagnosis not present

## 2018-01-06 DIAGNOSIS — Z79899 Other long term (current) drug therapy: Secondary | ICD-10-CM | POA: Diagnosis not present

## 2018-01-06 NOTE — Progress Notes (Signed)
SLEEP MEDICINE CLINIC   Provider:  Melvyn Novas, M D  Primary Care Physician:  Dorothyann Peng, MD   Referring Provider: Dorothyann Peng, MD   Chief Complaint  Patient presents with  . New Patient (Initial Visit)    pt alone, rm 10, waking up heart racing and palipitations, cardiology had a heart monitor on her and she did have episodes heart racing. pt does snore per husband but denies apnea episodes.     HPI:  Maria Bryan is a 42 y.o. african- [place]  female , and seen here on 3-14--2019 as in a referral from Dr. Allyne Gee for a sleep apnea evaluation.   Chief complaint according to patient : "I didn't have a concern about sleep - until recently when my husband was diagnosed with OSA and now uses a CPAP " I developed palpitations at night and became anxious, panicked. I looked at my fit - bit and this confirmed tachycardia".  She developed this palpitation and the end for the pay -freeze period of her [employer] job- a stressful time.   Maria Bryan has been referred by her primary care physician Dr. Allyne Gee but she also saw  recently her new cardiologist Dr. Chilton Si, who confirmed that she would like the patient to be evaluated for sleep apnea and supported the PSG test.  The patient has essential primary hypertension, recently complained of palpitations, she also has a history of snoring and her body mass index has exceeded 45.   She is on 2 medications for hypertension and until recently was on Qsymia / Phentermine  to help her with weight reduction - she is on none of these medications currently.   She is taking vitamin D, labetalol and amlodipine -she is not on hydrochlorothiazide.  She had her thyroid function recently tested which included TSH, and free T3 and T4 hormones all in normal range, her magnesium level was normal as well.  She has gained weight and developed chronic back pain. 25 pounds since last pregnancy. She had a lot of GERD late in  the pregnancy.    Sleep habits are as follows: She has a one year old son  and a three year old daughter - and they get ready for bed , bath and in bed by 7.30 PM. Her son likes to sleep in the parents bed, walks over almost once at night. She eats dinner at 8 Pm and cleans up.   She follows by 8-9 PM, exhausted, in a cool, quiet and dark bedroom. She likes some me- time, but falls asleep. She sleeps on one contoured pillow.  She snores as she sleeps on her side. She has back pain when on her belly, she uses a body pillow. She sleeps until her son comes to the room, she may have one nocturia. Palpitations came on at 1-2 AM.  She related none of these to vivid dreams, nightmares. 6.15 AM is rise time - and she wakes spontaneously . Her husband makes breakfast.   No naps in daytime, she feels always on the go.   Sleep medical history and family sleep history:  She never had insomnia, hypersomnia before.   Social history: married, 2 children- toddlers. Non smoker, ETOH- socially - weekends, caffeine - she drinks 1-2 a day, no sodas, no iced tea.    Review of Systems: Out of a complete 14 system review, the patient complains of only the following symptoms, and all other reviewed systems are negative. Snores, paltations.  Epworth score 4 , Fatigue severity score 35  , depression score N/a    Social History   Socioeconomic History  . Marital status: Married    Spouse name: Not on file  . Number of children: Not on file  . Years of education: Not on file  . Highest education level: Not on file  Occupational History  . Not on file  Social Needs  . Financial resource strain: Not on file  . Food insecurity:    Worry: Not on file    Inability: Not on file  . Transportation needs:    Medical: Not on file    Non-medical: Not on file  Tobacco Use  . Smoking status: Never Smoker  . Smokeless tobacco: Never Used  Substance and Sexual Activity  . Alcohol use: Yes    Comment: ocasionally    . Drug use: Not Currently  . Sexual activity: Not on file  Lifestyle  . Physical activity:    Days per week: Not on file    Minutes per session: Not on file  . Stress: Not on file  Relationships  . Social connections:    Talks on phone: Not on file    Gets together: Not on file    Attends religious service: Not on file    Active member of club or organization: Not on file    Attends meetings of clubs or organizations: Not on file    Relationship status: Not on file  . Intimate partner violence:    Fear of current or ex partner: Not on file    Emotionally abused: Not on file    Physically abused: Not on file    Forced sexual activity: Not on file  Other Topics Concern  . Not on file  Social History Narrative  . Not on file    Family History  Problem Relation Age of Onset  . Diabetes Mother   . Hypertension Mother   . Peripheral Artery Disease Mother   . Hypertension Father   . Prostate cancer Father   . Hypertension Sister   . Diabetes Maternal Aunt   . Hypertension Maternal Grandmother   . Diabetes Maternal Aunt   . Lung cancer Maternal Aunt     Past Medical History:  Diagnosis Date  . Hypertension     Past Surgical History:  Procedure Laterality Date  . ACHILLES TENDON REPAIR Left 2011  . CESAREAN SECTION      Current Outpatient Medications  Medication Sig Dispense Refill  . amLODipine (NORVASC) 10 MG tablet Take 1 tablet (10 mg total) by mouth daily. 30 tablet 0  . BIOTIN PO Take 10,000 Units by mouth daily.    . Cholecalciferol (VITAMIN D3) 2000 units TABS Take 2,000 Units by mouth daily.    . cyanocobalamin 500 MCG tablet Take 500 mcg by mouth daily.    . Dapsone (ACZONE) 7.5 % GEL Aczone 7.5 % topical gel with pump    . DICLOFENAC PO Take by mouth.    . fluocinolone (SYNALAR) 0.025 % ointment fluocinolone 0.01 % scalp oil and shower cap    . hydrocortisone 2.5 % cream hydrocortisone 2.5 % topical cream    . labetalol (NORMODYNE) 100 MG tablet Take  200 mg by mouth daily.     . magnesium oxide (MAG-OX) 400 MG tablet Take 400 mg by mouth daily.    . Multiple Vitamin (MULTIVITAMIN) tablet Take 1 tablet by mouth daily.     No current facility-administered medications for this  visit.     Allergies as of 01/06/2018  . (No Known Allergies)    Vitals: BP 124/82   Pulse 85   Ht  (1.651 m)   Wt 271 lb (122.9 kg)   BMI 45.10 kg/m  Last Weight:  Wt Readings from Last 1 Encounters:  01/06/18 271 lb (122.9 kg)   ZOX:WRUE mass index is 45.1 kg/m.     Last Height:   Ht Readings from Last 1 Encounters:  01/06/18  (1.651 m)    Physical exam:  General: The patient is awake, alert and appears not in acute distress. The patient is well groomed. Head: Normocephalic, atraumatic. Neck is supple. Mallampati 5,  neck circumference:17. Nasal airflow patent ,Retrognathia is seen.  Cardiovascular:  Regular rate and rhythm , without  murmurs or carotid bruit, and without distended neck veins. Respiratory: Lungs are clear to auscultation. Skin:  Without evidence of edema, or rash Trunk: BMI is elevated at 45 plus. . The patient's posture is erect.  Neurologic exam : The patient is awake and alert, oriented to place and time.   Memory subjective described as intact. Attention span & concentration ability appears normal.  Speech is fluent,  without dysarthria, dysphonia or aphasia.  Mood and affect are appropriate.  Cranial nerves: Pupils are equal and briskly reactive to light. Funduscopic exam without evidence of pallor or edema. Extraocular movements  in vertical and horizontal planes intact and without nystagmus. Visual fields by finger perimetry are intact. Hearing to finger rub intact.  Facial sensation intact to fine touch. Facial motor strength is symmetric and tongue and uvula move midline. Shoulder shrug was symmetrical.  Motor exam: Normal tone, muscle bulk and symmetric strength in all extremities. Sensory:  Fine touch,  pinprick and vibration were tested in all extremities. Proprioception tested in the upper extremities was normal. Coordination: Rapid alternating movements in the fingers/hands was normal. Finger-to-nose maneuver  normal without evidence of ataxia, dysmetria or tremor. Gait and station: Patient walks without assistive device and is able unassisted to climb up to the exam table. Strength within normal limits.  Stance is stable and normal. Turns with 3 Steps. Romberg testing is negative.  Deep tendon reflexes: in the  upper and lower extremities are symmetric and intact. Babinski maneuver response is downgoing.    Assessment:  After physical and neurologic examination, review of laboratory studies,  Personal review of imaging studies, reports of other /same  Imaging studies, results of polysomnography and / or neurophysiology testing and pre-existing records as far as provided in visit., my assessment is    I thank Dr. Allyne Gee for allowing me to take part in Mrs. Jones-Bigelow's healthcare, She is a 42 year old mother of 2 toddlers, who does keep an early bedtime, has a moderate diet and does not abuse caffeine alcohol or nicotine.  Her sleep has been refreshing but is frequently interrupted by her 52-year-old.  She does not have frequent bathroom breaks unless she drinks liquids very late.  Her husband has noted her to snore but he described as occurring.  He has not noted apnea.  Fatigue severity and her Epworth sleepiness score in the low range.  The palpitations that she suffered at night may be related to sleep apnea and for this reason I will order a polysomnography, but I could imagine that they were a protracted effect of phentermine.  I will order a sleep study and showed no apnea being diagnosed we will have to need again.  I hope that  the sleep study will also help her in her discussions regarding bariatric surgery and help her decision if she should undergo any procedure.     The patient  was advised of the nature of the diagnosed disorder , the treatment options and the  risks for general health and wellness arising from not treating the condition.   I spent more than 45 minutes of face to face time with the patient. Greater than 50% of time was spent in counseling and coordination of care. We have discussed the diagnosis and differential and I answered the patient's questions.    Plan:  Treatment plan and additional workup : PSG , SPLIT at AHI 20, 3 % score.    Rv as above.    Melvyn Novas, MD 01/06/2018, 9:59 AM  Certified in Neurology by ABPN Certified in Sleep Medicine by [facility] [address] [place], [place] [address]

## 2018-01-08 DIAGNOSIS — I10 Essential (primary) hypertension: Secondary | ICD-10-CM | POA: Diagnosis not present

## 2018-01-08 DIAGNOSIS — Z01812 Encounter for preprocedural laboratory examination: Secondary | ICD-10-CM | POA: Diagnosis not present

## 2018-01-08 DIAGNOSIS — Z9189 Other specified personal risk factors, not elsewhere classified: Secondary | ICD-10-CM | POA: Diagnosis not present

## 2018-01-09 ENCOUNTER — Telehealth: Payer: Self-pay

## 2018-01-09 ENCOUNTER — Other Ambulatory Visit: Payer: Self-pay | Admitting: Neurology

## 2018-01-09 DIAGNOSIS — R002 Palpitations: Secondary | ICD-10-CM

## 2018-01-09 DIAGNOSIS — G4739 Other sleep apnea: Secondary | ICD-10-CM

## 2018-01-09 DIAGNOSIS — R0683 Snoring: Secondary | ICD-10-CM

## 2018-01-09 NOTE — Telephone Encounter (Signed)
[employer] denied in lab sleep study, need HST order 

## 2018-01-09 NOTE — Telephone Encounter (Signed)
Order placed for the pt.

## 2018-01-14 ENCOUNTER — Other Ambulatory Visit (HOSPITAL_COMMUNITY): Payer: Self-pay | Admitting: General Surgery

## 2018-02-03 ENCOUNTER — Ambulatory Visit: Payer: Federal, State, Local not specified - PPO | Admitting: Registered"

## 2018-02-04 ENCOUNTER — Encounter: Payer: Federal, State, Local not specified - PPO | Attending: General Surgery | Admitting: Skilled Nursing Facility1

## 2018-02-04 ENCOUNTER — Encounter: Payer: Self-pay | Admitting: Skilled Nursing Facility1

## 2018-02-04 DIAGNOSIS — Z713 Dietary counseling and surveillance: Secondary | ICD-10-CM | POA: Insufficient documentation

## 2018-02-04 DIAGNOSIS — Z6841 Body Mass Index (BMI) 40.0 and over, adult: Secondary | ICD-10-CM | POA: Insufficient documentation

## 2018-02-04 NOTE — Progress Notes (Addendum)
Pre-Op Assessment Visit:  Pre-Operative sleeve Surgery  Medical Nutrition Therapy:  Appt start time: 11:03  End time:  11:39  Letter has been completed but has not been sent. Pt was given pre-op goals sheet and multivitamin sheet but no education.   Pt arrived with her son in a stroller. Pt states she works from home. Pt states her mother lives with her. Pt states she is extremely stressed and completes no self care for herself due to taking care of her husband and children before herself. Pt states she is working on focusing on herself and asking for her mother to help with her kids. Pt states he does not want to have surgery but feels it is her only option and began to tear up. Pts son began to have a temper tantrum causing the pt to need to leave before any education began.    Pt expectation of surgery: to be healthy  Pt expectation of Dietitian: none  Start weight at [facility]: 269.4 BMI: 64.5  24 hr Dietary Recall: First Meal: scrambled egg and [place] bacon or sausage and toast  Snack:  Second Meal: skipped Snack:  Third Meal: tuna salad or egg salad or lasagna or hamburger or hot dog Snack:  Beverages: water, juice, liquor   Encouraged to engage in 150 minutes of moderate physical activity including cardiovascular and weight baring weekly  Handouts given during visit include:  . Pre-Op Goals . Bariatric Surgery Protein Shakes During the appointment today the following Pre-Op Goals were reviewed with the patient: . Maintain or lose weight as instructed by your surgeon . Make healthy food choices . Begin to limit portion sizes . Limited concentrated sugars and fried foods . Keep fat/sugar in the single digits per serving on             food labels . Practice CHEWING your food  (aim for 30 chews per bite or until applesauce consistency) . Practice not drinking 15 minutes before, during, and 30 minutes after each meal/snack . Avoid all carbonated beverages  . Avoid/limit  caffeinated beverages  . Avoid all sugar-sweetened beverages . Consume 3 meals per day; eat every 3-5 hours . Make a list of non-food related activities . Aim for 64-100 ounces of FLUID daily  . Aim for at least 60-80 grams of PROTEIN daily . Look for a liquid protein source that contain ?15 g protein and ?5 g carbohydrate  (ex: shakes, drinks, shots)  -Follow diet recommendations listed below   Energy and Macronutrient Recomendations: Calories: 1600 Carbohydrate: 180 Protein: 120 Fat: 44  Demonstrated degree of understanding via:  Teach Back  Teaching Method Utilized:  Visual Auditory Hands on  Barriers to learning/adherence to lifestyle change: stress  Patient to call the Nutrition and Diabetes Education Services to enroll in Pre-Op and Post-Op Nutrition Education when surgery date is scheduled.

## 2018-02-11 ENCOUNTER — Ambulatory Visit (HOSPITAL_COMMUNITY): Payer: Federal, State, Local not specified - PPO

## 2018-02-11 ENCOUNTER — Encounter (HOSPITAL_COMMUNITY): Payer: Self-pay

## 2018-02-11 ENCOUNTER — Ambulatory Visit: Payer: Federal, State, Local not specified - PPO | Admitting: Registered"

## 2018-03-04 DIAGNOSIS — R509 Fever, unspecified: Secondary | ICD-10-CM | POA: Diagnosis not present

## 2018-03-04 DIAGNOSIS — R52 Pain, unspecified: Secondary | ICD-10-CM | POA: Diagnosis not present

## 2018-03-04 LAB — CBC AND DIFFERENTIAL
HCT: 38 (ref 36–46)
HEMOGLOBIN: 12.1 (ref 12.0–16.0)
Platelets: 318 (ref 150–399)
WBC: 2.8

## 2018-03-09 ENCOUNTER — Ambulatory Visit: Payer: Federal, State, Local not specified - PPO | Admitting: Neurology

## 2018-03-12 DIAGNOSIS — R8290 Unspecified abnormal findings in urine: Secondary | ICD-10-CM | POA: Diagnosis not present

## 2018-03-16 ENCOUNTER — Ambulatory Visit: Payer: Federal, State, Local not specified - PPO | Admitting: Neurology

## 2018-03-16 ENCOUNTER — Encounter: Payer: Self-pay | Admitting: Registered"

## 2018-03-16 ENCOUNTER — Encounter: Payer: Federal, State, Local not specified - PPO | Attending: General Surgery | Admitting: Registered"

## 2018-03-16 DIAGNOSIS — G4733 Obstructive sleep apnea (adult) (pediatric): Secondary | ICD-10-CM | POA: Diagnosis not present

## 2018-03-16 DIAGNOSIS — Z6841 Body Mass Index (BMI) 40.0 and over, adult: Secondary | ICD-10-CM | POA: Insufficient documentation

## 2018-03-16 DIAGNOSIS — E669 Obesity, unspecified: Secondary | ICD-10-CM

## 2018-03-16 DIAGNOSIS — Z713 Dietary counseling and surveillance: Secondary | ICD-10-CM | POA: Diagnosis not present

## 2018-03-16 DIAGNOSIS — R0683 Snoring: Secondary | ICD-10-CM

## 2018-03-16 DIAGNOSIS — R0681 Apnea, not elsewhere classified: Secondary | ICD-10-CM

## 2018-03-16 DIAGNOSIS — R002 Palpitations: Secondary | ICD-10-CM

## 2018-03-16 NOTE — Progress Notes (Signed)
Pre-Op Assessment Visit: Pre-Operative Sleeve Surgery  Medical Nutrition Therapy:  Appt start time: 4:20  End time:  5:13  Patient was seen on 03/16/2018 for Pre-Operative Nutrition Assessment. [facility] on 03/16/2018.   Pt expectation of surgery: to be healthy, teach children how to eat healthy and live an active lifestyle, reduce joint pain  Pt expectation of Dietitian: none  Start weight at [facility]: 269.4 Today's weight: 269.9 BMI: 45.61  Pt arrives having maintained weight from previous visit.   Pt states she works from home. Pt states her mother lives with her. Pt states she is extremely stressed and completes no self care for herself due to taking care of her husband and children before herself. Pt states she is working on focusing on herself and asking for her mother to help with her kids. Pt states he does not want to have surgery but feels it is her only option and began to tear up. Pts son began to have a temper tantrum causing the pt to need to leave before any education began.   24 hr Dietary Recall: First Meal: scrambled egg and [place] bacon or sausage and toast  Snack:  Second Meal: skipped Snack:  Third Meal: tuna salad or egg salad or lasagna or hamburger or hot dog Snack:  Beverages: water, juice, liquor   Encouraged to engage in 150 minutes of moderate physical activity including cardiovascular and weight baring weekly  Handouts given during visit include:  . Pre-Op Goals . Bariatric Surgery Protein Shakes . Vitamin and Mineral Recommendations  During the appointment today the following Pre-Op Goals were reviewed with the patient: . Maintain or lose weight as instructed by your surgeon . Make healthy food choices . Begin to limit portion sizes . Limited concentrated sugars and fried foods . Keep fat/sugar in the single digits per serving on             food  labels . Practice CHEWING your food  (aim for 30 chews per bite or until applesauce consistency) . Practice not drinking 15 minutes before, during, and 30 minutes after each meal/snack . Avoid all carbonated beverages  . Avoid/limit caffeinated beverages  . Avoid all sugar-sweetened beverages . Consume 3 meals per day; eat every 3-5 hours . Make a list of non-food related activities . Aim for 64-100 ounces of FLUID daily  . Aim for at least 60-80 grams of PROTEIN daily . Look for a liquid protein source that contain ?15 g protein and ?5 g carbohydrate  (ex: shakes, drinks, shots)  -Follow diet recommendations listed below   Energy and Macronutrient Recomendations: Calories: 1600 Carbohydrate: 180 Protein: 120 Fat: 44  Demonstrated degree of understanding via:  Teach Back   Teaching Method Utilized:  Visual Auditory Hands on  Barriers to learning/adherence to lifestyle change: stress  Patient to call the Nutrition and Diabetes Education Services to enroll in Pre-Op and Post-Op Nutrition Education when surgery date is scheduled.

## 2018-03-20 DIAGNOSIS — M5136 Other intervertebral disc degeneration, lumbar region: Secondary | ICD-10-CM | POA: Diagnosis not present

## 2018-03-20 DIAGNOSIS — M545 Low back pain: Secondary | ICD-10-CM | POA: Diagnosis not present

## 2018-03-23 ENCOUNTER — Ambulatory Visit (HOSPITAL_COMMUNITY)
Admission: RE | Admit: 2018-03-23 | Discharge: 2018-03-23 | Disposition: A | Discharge status: Home / Self Care | Payer: Federal, State, Local not specified - PPO | Source: Ambulatory Visit | Attending: General Surgery | Admitting: General Surgery

## 2018-03-23 ENCOUNTER — Other Ambulatory Visit: Payer: Self-pay

## 2018-03-23 DIAGNOSIS — R079 Chest pain, unspecified: Secondary | ICD-10-CM | POA: Diagnosis not present

## 2018-03-23 DIAGNOSIS — R9431 Abnormal electrocardiogram [ECG] [EKG]: Secondary | ICD-10-CM | POA: Insufficient documentation

## 2018-03-23 DIAGNOSIS — Z01810 Encounter for preprocedural cardiovascular examination: Secondary | ICD-10-CM | POA: Diagnosis not present

## 2018-03-23 DIAGNOSIS — R0602 Shortness of breath: Secondary | ICD-10-CM | POA: Diagnosis not present

## 2018-03-23 DIAGNOSIS — Z01818 Encounter for other preprocedural examination: Secondary | ICD-10-CM | POA: Diagnosis not present

## 2018-03-23 NOTE — Procedures (Signed)
Repeated HST -  [facility] [address] [place], [place] [address] NAME: [name]                                                                 DOB: February 18, 1976 MEDICAL RECORD NUMBER [id number]                                                                      DOS:  03/16/2018 REFERRING PHYSICIAN: Dorothyann Pengobyn Sanders, MD STUDY PERFORMED: Home Sleep Study on watch pat   HPI: Maria Bryan is a 42 y.o. African- American female patient and was seen here on 11-06-2017 in a referral from Dr. Allyne GeeSanders for a sleep apnea evaluation.   Chief complaint according to patient: "I didn't have a concern about sleep - until recently when my husband was diagnosed with OSA and now uses a CPAP". She developed palpitation at the end of the pay -freeze period - she was unpaid in her [employer] job- during a very stressful time.  "I developed palpitations at night and became anxious, panicked. I look at my fit - bit and this confirmed tachycardia".    Maria Bryan has been seen by her new cardiologist Dr. Chilton Siiffany [place], who confirmed that she would like the patient to be evaluated for sleep apnea and supported the PSG test.  The patient has essential primary hypertension, a history of snoring, and her body mass index has exceeded 45.   She has gained weight (25 pounds since last pregnancy). She had a lot of GERD late in the pregnancy. Epworth Sleepiness score 4/24 points; Fatigue severity score 35/63 points. BMI: 45.2  STUDY RESULTS:  Total Recording Time:  8 hours 1 minute, valid test time was 3 h and 57 min.  Total Apnea/Hypopnea Index (AHI):  34.8/h; RDI:   35.1/h Average Oxygen Saturation:   92 %; Lowest Oxygen Saturation: 74 %  Total Time Oxygen Saturation below 89 %: 10.4 minutes  Average Heart Rate:  84 bpm (between 57 and 113 bpm) IMPRESSION: Moderate- Severe Obstructive Sleep Apnea at AHI of 34.8 and loud snoring at RDI 35.1/h.  No  prolonged hypoxemia. There was a variable heart rate noted, but not tachy-brady cardia.  RECOMMENDATION: CPAP titration, auto-device between 5-15 cm water, 1 cm EPR, heated humidity, and mask of patient's comfort and choice.  I certify that I have reviewed the raw data recording prior to the issuance of this report in accordance with the standards of the American Academy of Sleep Medicine (AASM). Melvyn Novasarmen Robbin Escher, M.D. 03-23-2018      Medical Director of [facility], accredited by the AASM. Diplomat of the ABPN and ABSM.

## 2018-03-25 ENCOUNTER — Telehealth: Payer: Self-pay | Admitting: Neurology

## 2018-03-25 NOTE — Telephone Encounter (Signed)
-----   Message from Melvyn Novasarmen Dohmeier, MD sent at 03/23/2018  5:01 PM EDT ----- Cc Dr Allyne GeeSanders, please. IMPRESSION: Moderate- Severe Obstructive Sleep Apnea at AHI of  34.8 and loud snoring at RDI 35.1/h. No prolonged hypoxemia.  There was a variable heart rate noted, but not tachy-brady  cardia.  RECOMMENDATION: CPAP titration, auto-device between 5-15 cm  water, 1 cm EPR, heated humidity, and mask of patient's comfort  and choice.

## 2018-03-25 NOTE — Telephone Encounter (Signed)
Called patient to discuss sleep study results. No answer at this time. LVM for the patient to call back.   

## 2018-03-26 ENCOUNTER — Encounter: Payer: Self-pay | Admitting: Neurology

## 2018-03-26 NOTE — Telephone Encounter (Signed)
Pt returned call. I advised pt that Dr. Marylou Flesherohmier reviewed their sleep study results and found that pt has moderate to severe sleep apnea. Dr. Vickey Hugerohmeier recommends that pt starts a CPAP. I reviewed PAP compliance expectations with the pt. Pt is agreeable to starting a CPAP. I advised pt that an order will be sent to a DME, [facility], and [facility] will call the pt within about one week after they file with the pt's insurance. [facility] will show the pt how to use the machine, fit for masks, and troubleshoot the CPAP if needed. A follow up appt was made for insurance purposes with Dr. Vickey Hugerohmeier on Oct 23,2019 at 8:30 am. Pt verbalized understanding to arrive 15 minutes early and bring their CPAP. A letter with all of this information in it will be mailed to the pt as a reminder. I verified with the pt that the address we have on file is correct. Pt verbalized understanding of results. Pt had no questions at this time but was encouraged to call back if questions arise.

## 2018-04-01 DIAGNOSIS — Z1231 Encounter for screening mammogram for malignant neoplasm of breast: Secondary | ICD-10-CM | POA: Diagnosis not present

## 2018-04-01 DIAGNOSIS — M222X1 Patellofemoral disorders, right knee: Secondary | ICD-10-CM | POA: Diagnosis not present

## 2018-04-01 DIAGNOSIS — M2391 Unspecified internal derangement of right knee: Secondary | ICD-10-CM | POA: Diagnosis not present

## 2018-04-08 DIAGNOSIS — F509 Eating disorder, unspecified: Secondary | ICD-10-CM | POA: Diagnosis not present

## 2018-04-09 ENCOUNTER — Telehealth: Payer: Self-pay | Admitting: Cardiovascular Disease

## 2018-04-09 NOTE — Telephone Encounter (Signed)
Patient states she has increasing heart rate at rest, at night and during the day.  She sent an e mail to Dr. Duke Salviaandolph earlier this week and has not heard anything.  Please call.

## 2018-04-09 NOTE — Telephone Encounter (Signed)
Called patient regarding her palpitations. Patient mentions sending a message to Dr.Randoplh I did mention to her that she was out of the office, rounding in the hospital and she would look at her messages as soon as she could.   Reviewed last OV with patient, she also states that she believes it has to do with stress, she was recently diagnosed with sleep apnea, and is awaiting a CPAP machine and believes it has to do with this as well.  Patient states her HR is 98, but she can calm it down herself with deep breaths. I advised patient to continue to do that, also advised that normal HR was 60-100, so she was still in a normal range, and that made patient feel a lot better. Patient states that she gets herself worked up sometimes, and notices the palpitations more often. Patient denies any chest pains, SOB, swelling. No issues other than feeling her heart racing at times. Advised I would send a telephone note to Dr. and her nurse.   Patient was very thankful for the call, stating it did help her to talk to someone.

## 2018-04-14 NOTE — Telephone Encounter (Signed)
Received notification from [facility]. Pt has not returned their call to get set up with CPAP machine.  I'm voiding this sales order 8/9  8/16 LVM  8/20 LVM   Pt can call when she is ready to get set up.

## 2018-04-15 ENCOUNTER — Telehealth: Payer: Self-pay | Admitting: *Deleted

## 2018-04-15 NOTE — Telephone Encounter (Signed)
   [facility] Pre-operative Risk Assessment    Request for surgical clearance:  1. What type of surgery is being performed? Weight loss surgery/lap.gastric sleeve   2. When is this surgery scheduled? TBD- marked as URGENT   3. What type of clearance is required (medical clearance vs. Pharmacy clearance to hold med vs. Both)? medical  4. Are there any medications that need to be held prior to surgery and how long?   5. Practice name and name of physician performing surgery? [facility] Dr. Redmond Pulling   6. What is your office phone number [phone]    7.   What is your office fax number [phone] attn: Illene Regulus CMA  8.   Anesthesia type (None, local, MAC, general) ? general   Maria Bryan A Paul Torpey 04/15/2018, 4:28 PM  _________________________________________________________________   (provider comments below)

## 2018-04-16 ENCOUNTER — Telehealth: Payer: Self-pay | Admitting: Cardiovascular Disease

## 2018-04-16 NOTE — Telephone Encounter (Signed)
   Primary Cardiologist:Tiffany Duke Salviaandolph, MD  Chart reviewed as part of pre-operative protocol coverage. Phone call placed for clearance. LVM instructing pt to call back.   In summary, pt has been seen by Dr. Duke Salviaandolph for palpitation and HTN. Cardiac monitor 10/2017 showed NSR w/ PACs. No other findings. BP has been controlled w/ medications. Last OV was 12/2017. Pt reassured and instructed to f/u PRN.   No other cardiac history or additional CR.  Based on the revised cardiac risk index, this patient's properative risk of major cardiac events is less than 1% at 0.4%.  She will need to be reached by phone to ensure that she has not had any anginal symptomatology that may warrant further examination or testing.  If no symptoms, patient can be cleared for surgery.  Clearance status pending.    Robbie LisBrittainy Jabe Jeanbaptiste, PA-C 04/16/2018, 1:39 PM

## 2018-04-16 NOTE — Telephone Encounter (Signed)
Pre-op request was just received yesterday @ 440pm will be addressed this afternoon  Returned call [facility] she states that they were checking the status and our pre-op process is. I explained the process ot her and let her know there was an urgent message sent to our pre-op poll and should be address this afternoon during the pre-op period. She will update Helmut Musterlicia and await pre-op protocol

## 2018-04-16 NOTE — Telephone Encounter (Signed)
New Message:   Maria Bryan from [facility]  If the clearance will be addressed before appt or at the time of her appt with Dr. Duke Salviaandolph

## 2018-04-17 NOTE — Telephone Encounter (Signed)
We can try switching her beta blocker from labetalol to metoprolol.  However her BP is likely to be high with making this change.  Lets switch to metoprolol tartrate 100mg  bid.  Please track BP and HR.  If BP is >130/80 then call and we will need to add another BP medication.

## 2018-04-17 NOTE — Telephone Encounter (Signed)
Per patient she would like to keep medications as they are, no palpations since call made. She has appointment with Dr Duke Salviaandolph and will discuss further then

## 2018-04-17 NOTE — Telephone Encounter (Signed)
Per Nada BoozerLaura Ingold, NP pt has appt with Dr.[place] 05/05/18 and will leave clearance to Dr. Duke Salviaandolph at that time. Nada BoozerLaura Ingold, NP states she s/w surgeon's office as well and informed of pt's appt with Dr. Duke Salviaandolph.  I will remove from the call back poole.

## 2018-04-17 NOTE — Telephone Encounter (Signed)
Pt did have more palpitations but does not wish to switch meds at this time.  I believe she will be cleared for surgery but would like her to see Dr. Duke Salviaandolph on 05/05/18 - she is at < 1% of major cardiac events.

## 2018-04-20 ENCOUNTER — Encounter: Payer: Federal, State, Local not specified - PPO | Attending: General Surgery | Admitting: Registered"

## 2018-04-20 DIAGNOSIS — E669 Obesity, unspecified: Secondary | ICD-10-CM

## 2018-04-20 DIAGNOSIS — Z713 Dietary counseling and surveillance: Secondary | ICD-10-CM | POA: Insufficient documentation

## 2018-04-20 DIAGNOSIS — Z6841 Body Mass Index (BMI) 40.0 and over, adult: Secondary | ICD-10-CM | POA: Insufficient documentation

## 2018-04-20 NOTE — Progress Notes (Signed)
  Pre-Operative Nutrition Class:  Appt start time: 8:15 End time: 9:15.  Patient was seen on 04/20/2018 for Pre-Operative Bariatric Surgery Education at [facility].   Surgery date: TBD Surgery type: Sleeve Start weight at [facility]: 269.4 Weight today: 269.8   Samples given per MNT protocol. Patient educated on appropriate usage: Bariatric Advantage Multivitamin Lot # [id number] Exp: 05/2019  Bariatric Advantage Calcium Citrate Lot # [id number] Exp: 01/15/2019  Bariatric Advantage Calcium Citrate Lot # [id number] Exp: 12/25/2018  Renee Pain Protein Shake Lot # [id number] Exp: 11/01/2018  The following the learning objectives were met by the patient during this course:  Identify Pre-Op Dietary Goals and will begin 2 weeks pre-operatively  Identify appropriate sources of fluids and proteins   State protein recommendations and appropriate sources pre and post-operatively  Identify Post-Operative Dietary Goals and will follow for 2 weeks post-operatively  Identify appropriate multivitamin and calcium sources  Describe the need for physical activity post-operatively and will follow MD recommendations  State when to call healthcare provider regarding medication questions or post-operative complications  Handouts given during class include:  Pre-Op Bariatric Surgery Diet Handout  Protein Shake Handout  Post-Op Bariatric Surgery Nutrition Handout  BELT Program Information Flyer  Support Group Information Flyer  [facility] Bariatric Supplements Price List  Follow-Up Plan: Patient will follow-up at [facility] 2 weeks post operatively for diet advancement per MD.

## 2018-04-23 ENCOUNTER — Ambulatory Visit: Payer: Federal, State, Local not specified - PPO | Admitting: Registered"

## 2018-04-24 ENCOUNTER — Encounter: Payer: Federal, State, Local not specified - PPO | Admitting: Registered"

## 2018-04-24 ENCOUNTER — Encounter: Payer: Self-pay | Admitting: Registered"

## 2018-04-24 DIAGNOSIS — Z6841 Body Mass Index (BMI) 40.0 and over, adult: Secondary | ICD-10-CM | POA: Diagnosis not present

## 2018-04-24 DIAGNOSIS — Z713 Dietary counseling and surveillance: Secondary | ICD-10-CM | POA: Diagnosis not present

## 2018-04-24 DIAGNOSIS — E669 Obesity, unspecified: Secondary | ICD-10-CM

## 2018-04-24 NOTE — Patient Instructions (Addendum)
-   Aim to find at least 2-3 flavors of liquid protein that you enjoy that have at least 15 grams or more of protein and 5 grams or less of carbohydrates.   - Reduce caffeine intake as surgery approaches.   - Change to Lactaid 1% or skim milk.   - Try protein shake mixed in coffee.

## 2018-04-24 NOTE — Progress Notes (Signed)
Sleeve Gastrectomy Appt start time: 8:09 end time: 8:36  Assessment: 1st SWL Appointment.   Start Wt at [facility]: 269.4 Wt: 266.3 BMI: 45.00   Pt arrives having lost 3.5 lbs from previous visit.   Pt states she has been thinking more about food, habits, and appreciates the process of preparing for surgery. Pt states she feels better prepared and believes she can do this. Pt states she has been working on chewing well,  practicing not drinking while eating, and waiting 30 minutes after eating to drink. Pt states she has come up with alternatives to eating out and celebrating around food such as getting manicures/pedicures with friends.   Pt states she is drinking at least 64 ounces of fluid a day. Pt states she has been reducing alcohol intake; decompressing without wine now. Pt states she has been replacing chips with fruit.    MEDICATIONS: See list   DIETARY INTAKE:  24-hr recall:  B ( AM): [employer]-sausage, egg white muffin + coffee  Snk ( AM): fruit  L ( PM): [place] stew + crackers Snk ( PM): fruit D ( PM): grilled chicken + peas   Snk ( PM): none Beverages: coffee, Lactaid whole milk, water, lemonade  Usual physical activity: walking 20-30 min, 3x/week  Diet to Follow: 1600 calories 180 g carbohydrates 120 g protein 44 g fat  Preferred Learning Style:   No preference indicated   Learning Readiness:   Ready  Change in progress     Nutritional Diagnosis:  [place]-3.3 Overweight/obesity related to past poor dietary habits and physical inactivity as evidenced by patient w/ planned sleeve gastrectomy surgery following dietary guidelines for continued weight loss.    Intervention:  Nutrition counseling for upcoming Bariatric Surgery.  Goals:  - Aim for 150 minutes of physical activity including cardio and weight bearing every week - Aim to find at least 2-3 flavors of liquid protein that you enjoy that have at least 15 grams or more of protein and 5 grams or less of  carbohydrates.  - Reduce caffeine intake as surgery approaches.  - Change to Lactaid 1% or skim milk.  - Try protein shake mixed in coffee.   Teaching Method Utilized:  Visual Auditory Hands on  Handouts given during visit include:  none  Barriers to learning/adherence to lifestyle change: contemplative stage of change  Demonstrated degree of understanding via:  Teach Back   Monitoring/Evaluation:  Dietary intake, exercise, and body weight prn.

## 2018-05-05 ENCOUNTER — Ambulatory Visit: Payer: Federal, State, Local not specified - PPO | Admitting: Cardiovascular Disease

## 2018-05-05 VITALS — BP 124/74 | HR 69 | Ht 64.25 in | Wt 268.0 lb

## 2018-05-05 DIAGNOSIS — Z01818 Encounter for other preprocedural examination: Secondary | ICD-10-CM

## 2018-05-05 DIAGNOSIS — I10 Essential (primary) hypertension: Secondary | ICD-10-CM | POA: Diagnosis not present

## 2018-05-05 DIAGNOSIS — R002 Palpitations: Secondary | ICD-10-CM | POA: Diagnosis not present

## 2018-05-05 NOTE — Progress Notes (Signed)
Cardiology Office Note   Date:  05/05/2018   ID:  Maria Bryan, DOB Apr 04, 1976, MRN [id number]  PCP:  Dorothyann Peng, MD  Cardiologist:   Chilton Si, MD   No chief complaint on file.   History of Present Illness: Maria Bryan is a 42 y.o. female wih hypertension, OSA and palpitations here for follow up.  She was initially seen 10/2017 for the evaluation of palpitations.  Maria Bryan saw Dr. Allyne Gee on 10/2017 and noted palpitations.  For the last 4 weeks she has noted palpitations that awakened her from sleep.  She felt like her heart was beating out of her chest.  She then has a hard time falling back asleep.  She had recently stopped phenteramine.  She didn't have palpitations while taking it.  She was referred for a 14-day event monitor that revealed sinus rhythm and sinus tachycardia in the 110s.  She also had PACs.  At her last appointment Maria Bryan was feeling better.  She was exercising regularly and her stress at work had improved somewhat.  Since that time she called in the office because she had a period of increased palpitations.  She reflected and noticed that it was because her stress levels were elevated.  She started breathing, meditating, and praying and had an improvement in her symptoms.  Since that time she has been well.  She has not experienced any chest pain or pressure.  She continues to walk twice a day and has no exertional symptoms.  She sometimes gets anxious about whether or not she will know how to breathe when exercising.  Since her appointment she was diagnosed with sleep apnea and has not yet purchased a machine.  She is not sure whether she wants to go ahead and get it or wait until after she has gastric bypass surgery to see if she still needs it.  She does continue to wake up in the middle the night with palpitations.  She has not experienced any lower extremity edema, orthopnea, or PND.  She continues to work on changing  her diet.    Past Medical History:  Diagnosis Date  . Hypertension     Past Surgical History:  Procedure Laterality Date  . ACHILLES TENDON REPAIR Left 2011  . CESAREAN SECTION       Current Outpatient Medications  Medication Sig Dispense Refill  . amLODipine (NORVASC) 10 MG tablet Take 1 tablet (10 mg total) by mouth daily. 30 tablet 0  . BIOTIN PO Take 10,000 Units by mouth daily.    . Cholecalciferol (VITAMIN D3) 2000 units TABS Take 2,000 Units by mouth daily.    . cyanocobalamin 500 MCG tablet Take 500 mcg by mouth daily.    . Dapsone (ACZONE) 7.5 % GEL Aczone 7.5 % topical gel with pump    . DICLOFENAC PO Take by mouth.    . fluocinolone (SYNALAR) 0.025 % ointment fluocinolone 0.01 % scalp oil and shower cap    . hydrocortisone 2.5 % cream hydrocortisone 2.5 % topical cream    . labetalol (NORMODYNE) 100 MG tablet Take 200 mg by mouth daily.     . magnesium oxide (MAG-OX) 400 MG tablet Take 400 mg by mouth daily.    . Multiple Vitamin (MULTIVITAMIN) tablet Take 1 tablet by mouth daily.     No current facility-administered medications for this visit.     Allergies:   Patient has no known allergies.    Social History:  The patient  reports that she has never smoked. She has never used smokeless tobacco. She reports that she drinks alcohol. She reports that she has current or past drug history.   Family History:  The patient's family history includes Diabetes in her maternal aunt, maternal aunt, and mother; Hypertension in her father, maternal grandmother, mother, and sister; Lung cancer in her maternal aunt; Peripheral Artery Disease in her mother; Prostate cancer in her father.    ROS:  Please see the history of present illness.   Otherwise, review of systems are positive for none.   All other systems are reviewed and negative.    PHYSICAL EXAM: VS:  BP 124/74   Pulse 69   Ht 5' 4.25" (1.632 m)   Wt 268 lb (121.6 kg)   BMI 45.64 kg/m  , BMI Body mass index is  45.64 kg/m. GENERAL:  Well appearing HEENT: Pupils equal round and reactive, fundi not visualized, oral mucosa unremarkable NECK:  No jugular venous distention, waveform within normal limits, carotid upstroke brisk and symmetric, no bruits LUNGS:  Clear to auscultation bilaterally HEART:  RRR.  PMI not displaced or sustained,S1 and S2 within normal limits, no S3, no S4, no clicks, no rubs, no murmurs ABD:  Flat, positive bowel sounds normal in frequency in pitch, no bruits, no rebound, no guarding, no midline pulsatile mass, no hepatomegaly, no splenomegaly EXT:  2 plus pulses throughout, no edema, no cyanosis no clubbing SKIN:  No rashes no nodules NEURO:  Cranial nerves II through XII grossly intact, motor grossly intact throughout PSYCH:  Cognitively intact, oriented to person place and time   EKG:  EKG is ordered today. The ekg ordered 11/04/17 demonstrates sinus rhythm.  Rate 82 bpm. 05/05/2018: Sinus rhythm.  Rate 69 bpm.  Nonspecific T wave abnormalities.  Low voltage.   14 Day Event Monitor 11/12/17:  Sinus rhythm and sinus tachycardia to the 110s noted PACs noted No significant arrhythmias.  Recent Labs: No results found for requested labs within last 8760 hours.   10/28/17: Sodium 140, potassium 4.3, BUN 10, creatinine 0.87 TSH 3.49 Magnesium 1.8   Lipid Panel No results found for: CHOL, TRIG, HDL, CHOLHDL, VLDL, LDLCALC, LDLDIRECT    Wt Readings from Last 3 Encounters:  05/05/18 268 lb (121.6 kg)  04/24/18 266 lb 4.8 oz (120.8 kg)  03/16/18 269 lb 14.4 oz (122.4 kg)      ASSESSMENT AND PLAN:  # Pre-op: Maria Bryan is planning lap band.  The patient does not have any unstable cardiac conditions.  Upon evaluation today, she can achieve 4 METs or greater without anginal symptoms.  According to [facility] and AHA guidelines, she requires no further cardiac workup prior to her noncardiac surgery and should be at acceptable risk.  her NSQIP risk of peri-procedural MI  or cardiac arrest is <1%.  Our service is available as necessary in the perioperative period.  # Palpitations: Her symptoms are stress related. She also had palpitations and tachycardia at night which may be related to sleep apnea.  She will go ahead and get a CPAP machine.  # Obesity:  Maria Bryan was congratulated on her efforts to improve her diet and exercise.  # Hypertension: Blood pressure is well-controlled.  Continue amlodipine and labetalol.  # Atypical chest pain: Resolved.    Current medicines are reviewed at length with the patient today.  The patient does not have concerns regarding medicines.  The following changes have been made:  no change  Labs/ tests ordered today include:   No orders of the defined types were placed in this encounter.    Disposition:   FU with Chazz Philson C. Duke Salvia, MD, [facility] in 1 year.     Signed, Laquan Ludden C. Duke Salvia, MD, [facility]  05/05/2018 10:57 AM    [facility]

## 2018-05-05 NOTE — Patient Instructions (Signed)
Medication Instructions:  Your physician recommends that you continue on your current medications as directed. Please refer to the Current Medication list given to you today.  Labwork: NONE  Testing/Procedures: NONE  Follow-Up: Your physician wants you to follow-up in: 1 YEAR  You will receive a reminder letter in the mail two months in advance. If you don't receive a letter, please call our office to schedule the follow-up appointment.  If you need a refill on your cardiac medications before your next appointment, please call your pharmacy.  

## 2018-05-06 ENCOUNTER — Encounter: Payer: Self-pay | Admitting: Cardiovascular Disease

## 2018-05-20 ENCOUNTER — Ambulatory Visit: Payer: Self-pay | Admitting: General Surgery

## 2018-05-20 DIAGNOSIS — G8929 Other chronic pain: Secondary | ICD-10-CM | POA: Diagnosis not present

## 2018-05-20 DIAGNOSIS — I10 Essential (primary) hypertension: Secondary | ICD-10-CM | POA: Diagnosis not present

## 2018-05-20 DIAGNOSIS — M542 Cervicalgia: Secondary | ICD-10-CM | POA: Diagnosis not present

## 2018-05-20 NOTE — H&P (Signed)
Maria Bryan Documented: 05/20/2018 8:42 AM Location: [facility] Patient #: [phone] DOB: 18-Jul-1976 Married / Language: English / Race: Black or African American Female  History of Present Illness Maria Hiss M. Murle Otting MD; 05/20/2018 9:04 AM) The patient is a 42 year old female who presents for a bariatric surgery evaluation. She comes in today for preoperative evaluation. I initially met her about 4 months ago for consideration of weight loss surgery. She has completed her evaluation process. She had undergone a sleep study that showed moderate to severe obstructive sleep apnea. She is in the process of getting set up for CPAP. She has also been seen and evaluated and cleared by cardiology. She also has completed her psychological evaluation. Her chest x-ray and upper GI were unremarkable. Her blood work from March 2019 showed normal kidney, liver and thyroid function. She had an normal CBC. No evidence of diabetes mellitus. Vitamin D level XXIV.7. Lipid panel was essentially normal-LDL 121. Total cholesterol 201.  She denies any chest pain, chest pressure, shortness of breath, orthopnea, paroxysmal nocturnal dyspnea, gerd or heartburn.   12/2017 She is referred by Dr Maria Bryan for evaluation of weight loss surgery. She completed her seminar on line. She is interested in sleeve gastrectomy. She wants to be able to lose weight in order to be healthier. She also wants to have improvement in her joint and back pain. She wants to be able to participate in more activities with her small children. Despite numerous attempts for sustained weight loss she has been unsuccessful. She has tried [employer], [employer] several occasions, physician's weight loss, regular physical activity with Zumba classes-all without any long-term success.  She denies any chest pressure, chest tightness, chest pain. She has had some palpitations. She states that she wore a Holter monitor  for a month and it was negative.. She states her cardiologist felt that her palpitations were due to stress. She denies any peripheral edema, paroxysmal nocturnal dyspnea, dyspnea on exertion. She denies any personal or family history of blood clots. She just saw Maria Bryan neurology for evaluation for potential sleep apnea. She had a positive stop bang questionnaire. She is being scheduled for sleep study. She denies any heartburn, reflux, or vomiting. She denies any episodes of frequent abdominal pain. She denies any melena or hematochezia. She has a daily bowel movement. She has had a C-section 2 as well as a tubal ligation. She denies any dysuria or hematuria. She has chronic Bryan low back pain without radiculopathy as well as bilateral knee pain. She has had physical therapy for neck pain. She denies any TIAs or amaurosis fugax. She denies any frequent headaches. She denies any tobacco or drug use. She may have a few glasses of wine per week. 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   Problem List/Past Medical Maria Hiss M. 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 Redmond Pulling, MD; 05/20/2018 9:06 AM) Cesarean Section - Multiple Foot Surgery Left.  Diagnostic Studies History Maria Hiss M. Redmond Pulling, MD; 05/20/2018 9:06 AM) Colonoscopy never Mammogram within last year Pap Smear 1-5 years ago  Allergies (Maria Bryan, [place]; 05/20/2018 8:43 AM) No Known Drug Allergies [01/08/2018]: Allergies Reconciled  Medication History (Maria Bryan, [place]; 05/20/2018 8:43 AM) Labetalol HCl (200MG Tablet, Oral) Active. Vitamin  D (Ergocalciferol) (50000UNIT Capsule, Oral) Active. AmLODIPine  Besylate (10MG Tablet, Oral) Active.  Social History Maria Hiss M. Redmond Pulling, MD; 05/20/2018 9:06 AM) Alcohol use Occasional alcohol use. Caffeine use Coffee. No drug use Tobacco use Never smoker.  Family History Maria Hiss M. Redmond Pulling, MD; 05/20/2018 9:06 AM) Arthritis Mother. Diabetes Mellitus Mother. Hypertension Mother, Sister. Prostate Cancer Father.  Pregnancy / Birth History Maria Hiss M. Redmond Pulling, MD; 05/20/2018 9:06 AM) Age at menarche 29 years. Contraceptive History Oral contraceptives. Gravida 2 Length (months) of breastfeeding 7-12 Maternal age 57-40 Para 2 Regular periods  Other Problems Maria Hiss M. Redmond Pulling, MD; 05/20/2018 9:06 AM) Arthritis Back Pain Chest pain High blood pressure Other disease, cancer, significant illness     Review of Systems Maria Hiss M. Maria Lardizabal MD; 05/20/2018 9:04 AM) General Present- Fatigue and Weight Gain. Not Present- Appetite Loss, Chills, Fever, Night Sweats and Weight Loss. Skin Not Present- Change in Wart/Mole, Dryness, Hives, Jaundice, New Lesions, Non-Healing Wounds, Rash and Ulcer. HEENT Present- Seasonal Allergies and Wears glasses/contact lenses. Not Present- Earache, Hearing Loss, Hoarseness, Nose Bleed, Oral Ulcers, Ringing in the Ears, Sinus Pain, Sore Throat, Visual Disturbances and Yellow Eyes. Respiratory Present- Snoring. Not Present- Bloody sputum, Chronic Cough, Difficulty Breathing and Wheezing. Breast Not Present- Breast Mass, Breast Pain, Nipple Discharge and Skin Changes. Cardiovascular Present- Leg Cramps, Palpitations and Shortness of Breath. Not Present- Chest Pain, Difficulty Breathing Lying Down, Rapid Heart Rate and Swelling of Extremities. Gastrointestinal Not Present- Abdominal Pain, Bloating, Bloody Stool, Change in Bowel Habits, Chronic diarrhea, Constipation, Difficulty Swallowing, Excessive gas, Gets full quickly at meals, Hemorrhoids, Indigestion, Nausea, Rectal Pain and Vomiting. Female Genitourinary Not Present-  Frequency, Nocturia, Painful Urination, Pelvic Pain and Urgency. Musculoskeletal Present- Back Pain, Joint Pain, Muscle Pain and Muscle Weakness. Not Present- Joint Stiffness and Swelling of Extremities. Neurological Present- Numbness and Tingling. Not Present- Decreased Memory, Fainting, Headaches, Seizures, Tremor, Trouble walking and Weakness. Psychiatric Not Present- Anxiety, Bipolar, Change in Sleep Pattern, Depression, Fearful and Frequent crying. Endocrine Not Present- Cold Intolerance, Excessive Hunger, Hair Changes, Heat Intolerance, Hot flashes and New Diabetes. Hematology Not Present- Blood Thinners, Easy Bruising, Excessive bleeding, Gland problems, HIV and Persistent Infections.  Vitals (Maria A. Brown RMA; 05/20/2018 8:43 AM) 05/20/2018 8:42 AM Weight: 273.6 lb Height: 65in Body Surface Area: 2.26 m Body Mass Index: 45.53 kg/m  Temp.: 98.27F  Pulse: 91 (Regular)  BP: 128/86 (Sitting, Left Arm, Standard)      Physical Exam Maria Hiss M. Najah Liverman MD; 05/20/2018 9:05 AM)  General Mental Status-Alert. General Appearance-Consistent with stated age. Hydration-Well hydrated. Voice-Normal.  Head and Neck Head-normocephalic, atraumatic with no lesions or palpable masses. Trachea-midline. Thyroid Gland Characteristics - normal size and consistency.  Eye Eyeball - Bilateral-Extraocular movements intact. Sclera/Conjunctiva - Bilateral-No scleral icterus.  ENMT Ears -Note:normal ext ears.  Mouth and Throat -Note:lips intact.   Chest and Lung Exam Chest and lung exam reveals -quiet, even and easy respiratory effort with no use of accessory muscles and on auscultation, normal breath sounds, no adventitious sounds and normal vocal resonance. Inspection Chest Wall - Normal. Back - normal.  Breast - Did not examine.  Cardiovascular Cardiovascular examination reveals -normal heart sounds, regular rate and rhythm with no murmurs and normal  pedal pulses bilaterally.  Abdomen Inspection Inspection of the abdomen reveals - No Hernias. Skin - Scar - Note: old c/s scar. Palpation/Percussion Palpation and Percussion of the abdomen reveal - Soft, Non Tender, No Rebound tenderness, No Rigidity (guarding) and No hepatosplenomegaly. Auscultation Auscultation of the abdomen reveals - Bowel  sounds normal.  Peripheral Vascular Upper Extremity Palpation - Pulses bilaterally normal.  Neurologic Neurologic evaluation reveals -alert and oriented x 3 with no impairment of recent or remote memory. Mental Status-Normal.  Neuropsychiatric The patient's mood and affect are described as -normal. Judgment and Insight-insight is appropriate concerning matters relevant to self.  Musculoskeletal Normal Exam - Left-Upper Extremity Strength Normal and Lower Extremity Strength Normal. Normal Exam - Right-Upper Extremity Strength Normal and Lower Extremity Strength Normal.  Lymphatic Head & Neck  General Head & Neck Lymphatics: Bilateral - Description - Normal. Axillary - Did not examine. Femoral & Inguinal - Did not examine.    Assessment & Plan Maria Hiss M. Cortasia Screws MD; 05/20/2018 9:06 AM)  MORBID (SEVERE) OBESITY DUE TO EXCESS CALORIES (E66.01) Impression: We reviewed her bariatric surgery evaluation pathway. We reviewed her results. We discussed her lab results. We also discussed her sleep study. We discussed outcomes with sleeve gastrectomy. We discussed behavior traits that have been found to lead to long-term success such as dietary compliance, limiting fast food, regular physical activity. She'll be scheduled for a preoperative class once we obtain insurance verification. All of her questions were asked and answered. 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 Redmond Pulling, MD, FACS General, Bariatric, & Minimally Invasive Surgery [facility], [place]

## 2018-05-24 ENCOUNTER — Encounter: Payer: Self-pay | Admitting: Internal Medicine

## 2018-05-24 DIAGNOSIS — I10 Essential (primary) hypertension: Secondary | ICD-10-CM | POA: Insufficient documentation

## 2018-06-04 ENCOUNTER — Ambulatory Visit: Payer: Federal, State, Local not specified - PPO | Admitting: Internal Medicine

## 2018-06-05 NOTE — Patient Instructions (Addendum)
Maria Bryan  06/05/2018   Your procedure is scheduled on: 06-15-18     Report to [facility] Main  Entrance    Report to Admitting at 5:30 AM    Call this number if you have problems the morning of surgery [phone]     Remember: MORNING OF SURGERY DRINK:  1SHAKE BEFORE YOU LEAVE HOME, DRINK ALL OF THE SHAKE AT ONE TIME.   NO SOLID FOOD AFTER 600 PM THE NIGHT BEFORE YOUR SURGERY. YOU MAY DRINK CLEAR FLUIDS. THE SHAKE YOU DRINK BEFORE YOU LEAVE HOME WILL BE THE LAST FLUIDS YOU DRINK BEFORE SURGERY.  PAIN IS EXPECTED AFTER SURGERY AND WILL NOT BE COMPLETELY ELIMINATED. AMBULATION AND TYLENOL WILL HELP REDUCE INCISIONAL AND GAS PAIN. MOVEMENT IS KEY!  YOU ARE EXPECTED TO BE OUT OF BED WITHIN 4 HOURS OF ADMISSION TO YOUR PATIENT ROOM.  SITTING IN THE RECLINER THROUGHOUT THE DAY IS IMPORTANT FOR DRINKING FLUIDS AND MOVING GAS THROUGHOUT THE GI TRACT.  COMPRESSION STOCKINGS SHOULD BE WORN [facility] STAY UNLESS YOU ARE WALKING.   INCENTIVE SPIROMETER SHOULD BE USED EVERY HOUR WHILE AWAKE TO DECREASE POST-OPERATIVE COMPLICATIONS SUCH AS PNEUMONIA.  WHEN DISCHARGED HOME, IT IS IMPORTANT TO CONTINUE TO WALK EVERY HOUR AND USE THE INCENTIVE SPIROMETER EVERY HOUR.     CLEAR LIQUID DIET   Foods Allowed                                                                     Foods Excluded  Coffee and tea, regular and decaf                             liquids that you cannot  Plain Jell-O in any flavor                                             see through such as: Fruit ices (not with fruit pulp)                                     milk, soups, orange juice  Iced Popsicles                                    All solid food Carbonated beverages, regular and diet                                    Cranberry, grape and apple juices Sports drinks like Gatorade Lightly seasoned clear broth or consume(fat free) Sugar, honey syrup  Sample  Menu Breakfast                                Lunch  Supper Cranberry juice                    Beef broth                            Chicken broth Jell-O                                     Grape juice                           Apple juice Coffee or tea                        Jell-O                                      Popsicle                                                Coffee or tea                        Coffee or tea  _____________________________________________________________________       BRUSH YOUR TEETH MORNING OF SURGERY AND RINSE YOUR MOUTH OUT, NO CHEWING GUM CANDY OR MINTS.     Take these medicines the morning of surgery with A SIP OF WATER: Amlodipine (Norvasc), and Labetalol (Normodyne)                               You may not have any metal on your body including hair pins and              piercings  Do not wear jewelry, make-up, lotions, powders or perfumes, deodorant             Do not wear nail polish.  Do not shave  48 hours prior to surgery.                 Do not bring valuables to the hospital. [facility] IS NOT             RESPONSIBLE   FOR VALUABLES.  Contacts, dentures or bridgework may not be worn into surgery.  Leave suitcase in the car. After surgery it may be brought to your room.     Special Instructions: N/A              Please read over the following fact sheets you were given: _____________________________________________________________________  [facility] - Preparing for Surgery Before surgery, you can play an important role.  Because skin is not sterile, your skin needs to be as free of germs as possible.  You can reduce the number of germs on your skin by washing with CHG (chlorahexidine gluconate) soap before surgery.  CHG is an antiseptic cleaner which kills germs and bonds with the skin to continue killing germs even after washing. Please DO NOT use if you have an allergy to CHG or antibacterial  soaps.  If your skin becomes reddened/irritated stop using the  CHG and inform your nurse when you arrive at Short Stay. Do not shave (including legs and underarms) for at least 48 hours prior to the first CHG shower.  You may shave your face/neck. Please follow these instructions carefully:  1.  Shower with CHG Soap the night before surgery and the  morning of Surgery.  2.  If you choose to wash your hair, wash your hair first as usual with your  normal  shampoo.  3.  After you shampoo, rinse your hair and body thoroughly to remove the  shampoo.                           4.  Use CHG as you would any other liquid soap.  You can apply chg directly  to the skin and wash                       Gently with a scrungie or clean washcloth.  5.  Apply the CHG Soap to your body ONLY FROM THE NECK DOWN.   Do not use on face/ open                           Wound or open sores. Avoid contact with eyes, ears mouth and genitals (private parts).                       Wash face,  Genitals (private parts) with your normal soap.             6.  Wash thoroughly, paying special attention to the area where your surgery  will be performed.  7.  Thoroughly rinse your body with warm water from the neck down.  8.  DO NOT shower/wash with your normal soap after using and rinsing off  the CHG Soap.                9.  Pat yourself dry with a clean towel.            10.  Wear clean pajamas.            11.  Place clean sheets on your bed the night of your first shower and do not  sleep with pets. Day of Surgery : Do not apply any lotions/deodorants the morning of surgery.  Please wear clean clothes to the hospital/surgery center.  FAILURE TO FOLLOW THESE INSTRUCTIONS MAY RESULT IN THE CANCELLATION OF YOUR SURGERY PATIENT SIGNATURE_________________________________  NURSE SIGNATURE__________________________________  ________________________________________________________________________

## 2018-06-05 NOTE — Progress Notes (Signed)
05-05-18 (Epic) Cardiac Clearance from Dr. Chilton Si and EKG  03-15-18 (Epic) CXR

## 2018-06-08 ENCOUNTER — Telehealth: Payer: Self-pay | Admitting: Registered"

## 2018-06-08 NOTE — Telephone Encounter (Signed)
Pt called to verify pre-op diet details. Pt states her surgery date has been confirmed for 10/21 and she will only be able to do pre-op diet for 1 week. Pt states she plans to start pre-op diet today.

## 2018-06-10 ENCOUNTER — Encounter (HOSPITAL_COMMUNITY)
Admission: RE | Admit: 2018-06-10 | Discharge: 2018-06-10 | Disposition: A | Discharge status: Home / Self Care | Payer: Federal, State, Local not specified - PPO | Source: Ambulatory Visit | Attending: General Surgery | Admitting: General Surgery

## 2018-06-10 ENCOUNTER — Encounter (HOSPITAL_COMMUNITY): Payer: Self-pay

## 2018-06-10 ENCOUNTER — Other Ambulatory Visit: Payer: Self-pay

## 2018-06-10 DIAGNOSIS — G4733 Obstructive sleep apnea (adult) (pediatric): Secondary | ICD-10-CM | POA: Diagnosis not present

## 2018-06-10 DIAGNOSIS — Z01818 Encounter for other preprocedural examination: Secondary | ICD-10-CM | POA: Insufficient documentation

## 2018-06-10 DIAGNOSIS — I10 Essential (primary) hypertension: Secondary | ICD-10-CM | POA: Diagnosis not present

## 2018-06-10 DIAGNOSIS — M2550 Pain in unspecified joint: Secondary | ICD-10-CM | POA: Diagnosis not present

## 2018-06-10 LAB — COMPREHENSIVE METABOLIC PANEL
ALBUMIN: 4 g/dL (ref 3.5–5.0)
ALK PHOS: 83 U/L (ref 38–126)
ALT: 13 U/L (ref 0–44)
ANION GAP: 10 (ref 5–15)
AST: 19 U/L (ref 15–41)
BUN: 13 mg/dL (ref 6–20)
CALCIUM: 9.3 mg/dL (ref 8.9–10.3)
CO2: 24 mmol/L (ref 22–32)
Chloride: 107 mmol/L (ref 98–111)
Creatinine, Ser: 0.84 mg/dL (ref 0.44–1.00)
GFR calc Af Amer: >60 mL/min (ref >60)
GFR calc non Af Amer: >60 mL/min (ref >60)
Glucose, Bld: 100 mg/dL — ABNORMAL HIGH (ref 70–99)
Potassium: 4 mmol/L (ref 3.5–5.1)
SODIUM: 141 mmol/L (ref 135–145)
Total Bilirubin: 0.6 mg/dL (ref 0.3–1.2)
Total Protein: 7.4 g/dL (ref 6.5–8.1)

## 2018-06-10 LAB — CBC WITH DIFFERENTIAL/PLATELET
Abs Immature Granulocytes: 0.04 10*3/uL (ref 0.00–0.07)
BASOS ABS: 0 10*3/uL (ref 0.0–0.1)
Basophils Relative: 0 %
Eosinophils Absolute: 0.1 10*3/uL (ref 0.0–0.5)
Eosinophils Relative: 2 %
HEMATOCRIT: 35.6 % — AB (ref 36.0–46.0)
HEMOGLOBIN: 11.3 g/dL — AB (ref 12.0–15.0)
IMMATURE GRANULOCYTES: 1 %
LYMPHS ABS: 2.3 10*3/uL (ref 0.7–4.0)
LYMPHS PCT: 40 %
MCH: 29 pg (ref 26.0–34.0)
MCHC: 31.7 g/dL (ref 30.0–36.0)
MCV: 91.5 fL (ref 80.0–100.0)
Monocytes Absolute: 0.6 10*3/uL (ref 0.1–1.0)
Monocytes Relative: 10 %
NEUTROS ABS: 2.7 10*3/uL (ref 1.7–7.7)
NRBC: 0 % (ref 0.0–0.2)
Neutrophils Relative %: 47 %
Platelets: 376 10*3/uL (ref 150–400)
RBC: 3.89 MIL/uL (ref 3.87–5.11)
RDW: 14 % (ref 11.5–15.5)
WBC: 5.8 10*3/uL (ref 4.0–10.5)

## 2018-06-10 LAB — ABO/RH: ABO/RH(D): O POS

## 2018-06-11 ENCOUNTER — Ambulatory Visit (INDEPENDENT_AMBULATORY_CARE_PROVIDER_SITE_OTHER): Payer: Federal, State, Local not specified - PPO | Admitting: Internal Medicine

## 2018-06-11 ENCOUNTER — Telehealth: Payer: Self-pay | Admitting: Neurology

## 2018-06-11 ENCOUNTER — Encounter: Payer: Self-pay | Admitting: Internal Medicine

## 2018-06-11 VITALS — BP 122/78 | HR 79 | Temp 97.9°F | Ht 64.0 in | Wt 266.4 lb

## 2018-06-11 DIAGNOSIS — I10 Essential (primary) hypertension: Secondary | ICD-10-CM | POA: Diagnosis not present

## 2018-06-11 DIAGNOSIS — Z0000 Encounter for general adult medical examination without abnormal findings: Secondary | ICD-10-CM

## 2018-06-11 LAB — POCT URINALYSIS DIPSTICK
BILIRUBIN UA: NEGATIVE
Glucose, UA: NEGATIVE
KETONES UA: NEGATIVE
Leukocytes, UA: NEGATIVE
NITRITE UA: NEGATIVE
Protein, UA: NEGATIVE
RBC UA: NEGATIVE
SPEC GRAV UA: 1.02 (ref 1.010–1.025)
UROBILINOGEN UA: 0.2 U/dL
pH, UA: 6 (ref 5.0–8.0)

## 2018-06-11 LAB — POCT UA - MICROALBUMIN
Creatinine, POC: 300 mg/dL
Microalbumin Ur, POC: 80 mg/L

## 2018-06-11 NOTE — Telephone Encounter (Signed)
called the patient back and advised that this was all completed in July. I advised the patient that she may want to touch base with the surgeons office and verify if she needs the CPAP prior to surgery. A lot of the time patient's who go through this process tell me they have to have been set up with CPAP prior to surgery. Im not sure how accurate that is or as long as they just know prior to surgery the pt has apnea. I have advised her just to contact them and verify. As far as whether she needs to start the machine, its still recommended to start the CPAP post surgery because it may take time for the patient to loose the weight that is needed to get the patient's apnea under control in which we would need to also re test when she has lost weight to make sure its controlled. Pt will contact me back on what is needed from me and when she does start CPAP a apt will need to be made 31-90 days out. Cancelled her 10/23 apt.

## 2018-06-11 NOTE — Progress Notes (Addendum)
Subjective:     Patient's last menstrual period was 05/29/2018 (exact date).Negative for: breast discharge, breast lump(s), breast pain and breast self exam. Associated symptoms include abnormal vaginal bleeding. Pertinent negatives include abnormal bleeding (hematology), anxiety, decreased libido, depression, difficulty falling sleep, dyspareunia, history of infertility, nocturia, sexual dysfunction, sleep disturbances, urinary incontinence, urinary urgency, vaginal discharge and vaginal itching. Diet regular.The patient states her exercise level is  moderate.  . The patient's tobacco use is:  Social History   Tobacco Use  Smoking Status Never Smoker  Smokeless Tobacco Never Used  . She has been exposed to passive smoke. The patient's alcohol use is:  Social History   Substance and Sexual Activity  Alcohol Use Yes   Comment: ocasionally  . Additional information: Last pap Spring 2019.   Patient ID: Maria Bryan , female    DOB: 09-Feb-1976 , 42 y.o.   MRN: [id number]   She is here today for a full physical exam. She is followed by Dr. Dion Body for her pelvic exams/pap smear. She was last seen Spring 2019.   Hypertension  This is a chronic problem. The current episode started more than 1 year ago. The problem is unchanged. The problem is controlled. Pertinent negatives include no blurred vision, chest pain, palpitations or shortness of breath. Risk factors for coronary artery disease include obesity. The current treatment provides significant improvement. There are no compliance problems.      Past Medical History:  Diagnosis Date  . Hypertension       Current Outpatient Medications:  .  acetaminophen (TYLENOL) 500 MG tablet, Take 1,000 mg by mouth daily as needed for moderate pain or headache., Disp: , Rfl:  .  amLODipine (NORVASC) 10 MG tablet, Take 1 tablet (10 mg total) by mouth daily., Disp: 30 tablet, Rfl: 0 .  Ascorbic Acid (CHEW-C) 500 MG CHEW, Chew 500 mg by  mouth daily., Disp: , Rfl:  .  Biotin 40981 MCG TABS, Take 10,000 mcg by mouth daily., Disp: , Rfl:  .  cholecalciferol (VITAMIN D) 1000 units tablet, Take 2,000 Units by mouth daily., Disp: , Rfl:  .  cyanocobalamin 500 MCG tablet, Take 500 mcg by mouth daily., Disp: , Rfl:  .  cyclobenzaprine (FLEXERIL) 10 MG tablet, Take 10 mg by mouth daily as needed for muscle spasms., Disp: , Rfl:  .  Dapsone (ACZONE) 7.5 % GEL, Apply 1 application topically daily as needed (acne). , Disp: , Rfl:  .  Fluocinolone Acetonide Scalp 0.01 % OIL, Apply 1 application topically every 14 (fourteen) days., Disp: , Rfl:  .  fluocinonide ointment (LIDEX) 0.05 %, Apply 1 application topically every other day., Disp: , Rfl:  .  hydrocortisone 2.5 % cream, Apply 1 application topically every other day. , Disp: , Rfl:  .  labetalol (NORMODYNE) 200 MG tablet, Take 200 mg by mouth 2 (two) times daily., Disp: , Rfl:  .  magnesium oxide (MAG-OX) 400 MG tablet, Take 400 mg by mouth daily., Disp: , Rfl:  .  Multiple Vitamin (MULTIVITAMIN) tablet, Take 2 tablets by mouth daily. , Disp: , Rfl:  .  PRESCRIPTION MEDICATION, Apply 1 application topically daily as needed (pain). diclofenac 3% baclofen 2% lidocaine 5% Menthol 1%, Disp: , Rfl:  .  traMADol (ULTRAM) 50 MG tablet, Take 50 mg by mouth daily as needed for severe pain., Disp: , Rfl:  .  tretinoin (RETIN-A) 0.025 % cream, Apply 1 application topically at bedtime., Disp: , Rfl:    No Known Allergies  Review of Systems  Constitutional: Negative.   HENT: Negative.   Eyes: Negative.  Negative for blurred vision.  Respiratory: Negative.  Negative for shortness of breath.   Cardiovascular: Negative.  Negative for chest pain and palpitations.  Gastrointestinal: Negative.   Genitourinary: Negative.   Skin: Negative.   Neurological: Negative.   Psychiatric/Behavioral: Negative.      Today's Vitals   06/11/18 1049  BP: 122/78  Pulse: 79  Temp: 97.9 F (36.6 C)   TempSrc: Oral  Weight: 266 lb 6.4 oz (120.8 kg)  Height: 5\' 4"  (1.626 m)   Body mass index is 45.73 kg/m.   Objective:  Physical Exam  Constitutional: She is oriented to person, place, and time. She appears well-developed and well-nourished.  HENT:  Head: Normocephalic and atraumatic.  Right Ear: External ear normal.  Left Ear: External ear normal.  Nose: Nose normal.  Mouth/Throat: Oropharynx is clear and moist.  Eyes: Pupils are equal, round, and reactive to light. Conjunctivae and EOM are normal.  Neck: Normal range of motion. Neck supple.  Cardiovascular: Normal rate, regular rhythm, normal heart sounds and intact distal pulses.  Pulmonary/Chest: Effort normal and breath sounds normal. Right breast exhibits no inverted nipple, no mass, no nipple discharge, no skin change and no tenderness. Left breast exhibits no inverted nipple, no mass, no nipple discharge, no skin change and no tenderness.  Abdominal: Soft. Bowel sounds are normal. She exhibits no mass. There is no tenderness.  obese  Genitourinary:  Genitourinary Comments: deferred  Musculoskeletal: Normal range of motion.  Neurological: She is alert and oriented to person, place, and time.  Skin: Skin is warm and dry.  Psychiatric: She has a normal mood and affect.  Nursing note and vitals reviewed.       Assessment And Plan:     1. Routine general medical examination at health care facility  A full exam was performed.  Importance of monthly self breast exams was discussed with the patient.  She is scheduled for gastric sleeve surgery on October 21st. She was congratulated on taking steps to improve her health. I plan to see her four weeks post-op. PATIENT HAS BEEN ADVISED TO GET 30-45 MINUTES REGULAR EXERCISE NO LESS THAN FOUR TO FIVE DAYS PER WEEK - BOTH WEIGHTBEARING EXERCISES AND AEROBIC ARE RECOMMENDED.  SHE IS ADVISED TO FOLLOW A HEALTHY DIET WITH AT LEAST SIX FRUITS/VEGGIES PER DAY, DECREASE INTAKE OF RED MEAT,  AND TO INCREASE FISH INTAKE TO TWO DAYS PER WEEK.  MEATS/FISH SHOULD NOT BE FRIED, BAKED OR BROILED IS PREFERABLE.  I SUGGEST WEARING SPF 50 SUNSCREEN ON EXPOSED PARTS AND ESPECIALLY WHEN IN THE DIRECT SUNLIGHT FOR AN EXTENDED PERIOD OF TIME.  PLEASE AVOID FAST FOOD RESTAURANTS AND INCREASE YOUR WATER INTAKE.  - Hemoglobin A1c - Lipid Profile  2. Essential hypertension Well controlled. She will continue with current meds. She is encouraged to avoid adding salt to her foods. EKG performed, no new changes noted. Again, she will RTO four weeks post-op for re-evaluation. Pt advised that her dose of medications will likely change as she continues to lose weight.   - EKG 12-Lead      Gwynneth Aliment, MD

## 2018-06-11 NOTE — Telephone Encounter (Signed)
Pt has called to cancel her initial CPAP f/u because she has not started using the CPAP because on 10-21 she will have bariatric surgery.  Pt asking for RN to call her back to let her know if she should r/s now or just cancel and wait a few weeks to see if she will still need the CPAP. Please call

## 2018-06-12 ENCOUNTER — Encounter (HOSPITAL_COMMUNITY): Payer: Self-pay | Admitting: Anesthesiology

## 2018-06-12 LAB — LIPID PANEL
CHOLESTEROL TOTAL: 199 mg/dL (ref 100–199)
Chol/HDL Ratio: 3.4 ratio (ref 0.0–4.4)
HDL: 58 mg/dL (ref >39)
LDL Calculated: 119 mg/dL — ABNORMAL HIGH (ref 0–99)
Triglycerides: 110 mg/dL (ref 0–149)
VLDL CHOLESTEROL CAL: 22 mg/dL (ref 5–40)

## 2018-06-12 LAB — HEMOGLOBIN A1C
ESTIMATED AVERAGE GLUCOSE: 108 mg/dL
HEMOGLOBIN A1C: 5.4 % (ref 4.8–5.6)

## 2018-06-12 NOTE — Anesthesia Preprocedure Evaluation (Addendum)
Anesthesia Evaluation  Patient identified by MRN, date of birth, ID band Patient awake    Reviewed: Allergy & Precautions, NPO status , Patient's Chart, lab work & pertinent test results  Airway Mallampati: II       Dental no notable dental hx. (+) Teeth Intact   Pulmonary sleep apnea ,    Pulmonary exam normal breath sounds clear to auscultation       Cardiovascular hypertension, Pt. on medications and Pt. on home beta blockers Normal cardiovascular exam Rhythm:Regular Rate:Normal     Neuro/Psych negative neurological ROS  negative psych ROS   GI/Hepatic   Endo/Other  Morbid obesity  Renal/GU   negative genitourinary   Musculoskeletal negative musculoskeletal ROS (+)   Abdominal (+) + obese,   Peds  Hematology  (+) anemia ,   Anesthesia Other Findings   Reproductive/Obstetrics                           Anesthesia Physical Anesthesia Plan  ASA: III  Anesthesia Plan: General   Post-op Pain Management:    Induction: Intravenous  PONV Risk Score and Plan: 4 or greater and Ondansetron, Dexamethasone, Midazolam and Scopolamine patch - Pre-op  Airway Management Planned: Oral ETT  Additional Equipment:   Intra-op Plan:   Post-operative Plan: Extubation in OR  Informed Consent: I have reviewed the patients History and Physical, chart, labs and discussed the procedure including the risks, benefits and alternatives for the proposed anesthesia with the patient or authorized representative who has indicated his/her understanding and acceptance.   Dental advisory given  Plan Discussed with: CRNA and Surgeon  Anesthesia Plan Comments:        Anesthesia Quick Evaluation

## 2018-06-13 NOTE — Progress Notes (Signed)
Here are your lab results:  Your hba1c is 5.4, this is great. You are not prediabetic.  Your LDL, bad cholesterol is 119 - not too bad. Our goal is less than 100.    I pray all goes well on Monday! My prayer is that you will have a successful surgery free of any complications! I plan to see you in a month. Please feel free to reach out if you have any concerns.   Sincerely,    Lawsyn Heiler N. Allyne Gee, MD

## 2018-06-14 MED ORDER — BUPIVACAINE LIPOSOME 1.3 % IJ SUSP
20.0000 mL | INTRAMUSCULAR | Status: DC
  Filled 2018-06-14: qty 20

## 2018-06-15 ENCOUNTER — Inpatient Hospital Stay (HOSPITAL_COMMUNITY)
Admission: RE | Admit: 2018-06-15 | Discharge: 2018-06-16 | DRG: 621 | Disposition: A | Discharge status: Home / Self Care | Payer: Federal, State, Local not specified - PPO | Source: Other Acute Inpatient Hospital | Attending: General Surgery | Admitting: General Surgery

## 2018-06-15 ENCOUNTER — Other Ambulatory Visit: Payer: Self-pay

## 2018-06-15 ENCOUNTER — Inpatient Hospital Stay (HOSPITAL_COMMUNITY): Payer: Federal, State, Local not specified - PPO | Admitting: Anesthesiology

## 2018-06-15 ENCOUNTER — Encounter (HOSPITAL_COMMUNITY)
Admission: RE | Disposition: A | Discharge status: Home / Self Care | Payer: Self-pay | Source: Other Acute Inpatient Hospital | Attending: General Surgery

## 2018-06-15 ENCOUNTER — Encounter (HOSPITAL_COMMUNITY): Payer: Self-pay | Admitting: *Deleted

## 2018-06-15 DIAGNOSIS — I10 Essential (primary) hypertension: Secondary | ICD-10-CM | POA: Diagnosis present

## 2018-06-15 DIAGNOSIS — Z9884 Bariatric surgery status: Secondary | ICD-10-CM

## 2018-06-15 DIAGNOSIS — M545 Low back pain, unspecified: Secondary | ICD-10-CM | POA: Diagnosis present

## 2018-06-15 DIAGNOSIS — M542 Cervicalgia: Secondary | ICD-10-CM | POA: Diagnosis present

## 2018-06-15 DIAGNOSIS — G8929 Other chronic pain: Secondary | ICD-10-CM | POA: Diagnosis present

## 2018-06-15 DIAGNOSIS — G4733 Obstructive sleep apnea (adult) (pediatric): Secondary | ICD-10-CM | POA: Diagnosis present

## 2018-06-15 DIAGNOSIS — Z79899 Other long term (current) drug therapy: Secondary | ICD-10-CM | POA: Diagnosis not present

## 2018-06-15 DIAGNOSIS — Z6841 Body Mass Index (BMI) 40.0 and over, adult: Secondary | ICD-10-CM | POA: Diagnosis not present

## 2018-06-15 HISTORY — PX: LAPAROSCOPIC GASTRIC SLEEVE RESECTION: SHX5895

## 2018-06-15 LAB — PREGNANCY, URINE: Preg Test, Ur: NEGATIVE

## 2018-06-15 LAB — HEMOGLOBIN AND HEMATOCRIT, BLOOD
HEMATOCRIT: 35.7 % — AB (ref 36.0–46.0)
Hemoglobin: 11.3 g/dL — ABNORMAL LOW (ref 12.0–15.0)

## 2018-06-15 LAB — TYPE AND SCREEN
ABO/RH(D): O POS
Antibody Screen: NEGATIVE

## 2018-06-15 SURGERY — GASTRECTOMY, SLEEVE, LAPAROSCOPIC
Anesthesia: General

## 2018-06-15 MED ORDER — HEPARIN SODIUM (PORCINE) 5000 UNIT/ML IJ SOLN
5000.0000 [IU] | INTRAMUSCULAR | Status: AC
  Administered 2018-06-15: 5000 [IU] via SUBCUTANEOUS
  Filled 2018-06-15: qty 1

## 2018-06-15 MED ORDER — ONDANSETRON HCL 4 MG/2ML IJ SOLN
INTRAMUSCULAR | Status: AC
  Filled 2018-06-15: qty 2

## 2018-06-15 MED ORDER — GABAPENTIN 300 MG PO CAPS
300.0000 mg | ORAL_CAPSULE | ORAL | Status: AC
  Administered 2018-06-15: 300 mg via ORAL
  Filled 2018-06-15: qty 1

## 2018-06-15 MED ORDER — ENOXAPARIN SODIUM 30 MG/0.3ML ~~LOC~~ SOLN
30.0000 mg | Freq: Two times a day (BID) | SUBCUTANEOUS | Status: DC
  Administered 2018-06-15 – 2018-06-16 (×2): 30 mg via SUBCUTANEOUS
  Filled 2018-06-15 (×2): qty 0.3

## 2018-06-15 MED ORDER — SCOPOLAMINE 1 MG/3DAYS TD PT72
1.0000 | MEDICATED_PATCH | TRANSDERMAL | Status: DC
  Administered 2018-06-15: 1.5 mg via TRANSDERMAL
  Filled 2018-06-15: qty 1

## 2018-06-15 MED ORDER — DEXAMETHASONE SODIUM PHOSPHATE 10 MG/ML IJ SOLN
INTRAMUSCULAR | Status: DC | PRN
  Administered 2018-06-15: 5.5 mg via INTRAVENOUS

## 2018-06-15 MED ORDER — KETOROLAC TROMETHAMINE 30 MG/ML IJ SOLN
INTRAMUSCULAR | Status: AC
  Filled 2018-06-15: qty 1

## 2018-06-15 MED ORDER — PREMIER PROTEIN SHAKE
2.0000 [oz_av] | ORAL | Status: DC
  Administered 2018-06-16 (×3): 2 [oz_av] via ORAL

## 2018-06-15 MED ORDER — PROMETHAZINE HCL 25 MG/ML IJ SOLN: 6.2500 mg | INTRAMUSCULAR | Status: DC | PRN

## 2018-06-15 MED ORDER — SIMETHICONE 80 MG PO CHEW: 80.0000 mg | CHEWABLE_TABLET | Freq: Four times a day (QID) | ORAL | Status: DC | PRN

## 2018-06-15 MED ORDER — ONDANSETRON HCL 4 MG/2ML IJ SOLN: 4.0000 mg | Freq: Four times a day (QID) | INTRAMUSCULAR | Status: DC | PRN

## 2018-06-15 MED ORDER — 0.9 % SODIUM CHLORIDE (POUR BTL) OPTIME
TOPICAL | Status: DC | PRN
  Administered 2018-06-15: 1000 mL

## 2018-06-15 MED ORDER — OXYCODONE HCL 5 MG/5ML PO SOLN: 5.0000 mg | ORAL | Status: DC | PRN

## 2018-06-15 MED ORDER — LABETALOL HCL 200 MG PO TABS
200.0000 mg | ORAL_TABLET | Freq: Two times a day (BID) | ORAL | Status: DC
  Administered 2018-06-15 – 2018-06-16 (×2): 200 mg via ORAL
  Filled 2018-06-15 (×2): qty 1

## 2018-06-15 MED ORDER — CHLORHEXIDINE GLUCONATE 4 % EX LIQD
60.0000 mL | Freq: Once | CUTANEOUS | Status: AC
  Administered 2018-06-15: 4 via TOPICAL

## 2018-06-15 MED ORDER — SUGAMMADEX SODIUM 200 MG/2ML IV SOLN
INTRAVENOUS | Status: DC | PRN
  Administered 2018-06-15: 300 mg via INTRAVENOUS

## 2018-06-15 MED ORDER — FENTANYL CITRATE (PF) 250 MCG/5ML IJ SOLN
INTRAMUSCULAR | Status: AC
  Filled 2018-06-15: qty 10

## 2018-06-15 MED ORDER — PROPOFOL 10 MG/ML IV BOLUS
INTRAVENOUS | Status: AC
  Filled 2018-06-15: qty 40

## 2018-06-15 MED ORDER — MIDAZOLAM HCL 2 MG/2ML IJ SOLN
INTRAMUSCULAR | Status: AC
  Filled 2018-06-15: qty 2

## 2018-06-15 MED ORDER — MIDAZOLAM HCL 5 MG/5ML IJ SOLN
INTRAMUSCULAR | Status: DC | PRN
  Administered 2018-06-15: 2 mg via INTRAVENOUS

## 2018-06-15 MED ORDER — AMLODIPINE BESYLATE 10 MG PO TABS
10.0000 mg | ORAL_TABLET | Freq: Every day | ORAL | Status: DC
  Administered 2018-06-16: 10 mg via ORAL
  Filled 2018-06-15: qty 1

## 2018-06-15 MED ORDER — APREPITANT 40 MG PO CAPS
40.0000 mg | ORAL_CAPSULE | ORAL | Status: AC
  Administered 2018-06-15: 40 mg via ORAL
  Filled 2018-06-15: qty 1

## 2018-06-15 MED ORDER — STERILE WATER FOR IRRIGATION IR SOLN
Status: DC | PRN
  Administered 2018-06-15: 2000 mL

## 2018-06-15 MED ORDER — ACETAMINOPHEN 160 MG/5ML PO SOLN
650.0000 mg | Freq: Four times a day (QID) | ORAL | Status: DC
  Administered 2018-06-15 – 2018-06-16 (×3): 650 mg via ORAL
  Filled 2018-06-15 (×3): qty 20.3

## 2018-06-15 MED ORDER — ROCURONIUM BROMIDE 100 MG/10ML IV SOLN
INTRAVENOUS | Status: AC
  Filled 2018-06-15: qty 1

## 2018-06-15 MED ORDER — MEPERIDINE HCL 50 MG/ML IJ SOLN: 6.2500 mg | INTRAMUSCULAR | Status: DC | PRN

## 2018-06-15 MED ORDER — HYDROMORPHONE HCL 1 MG/ML IJ SOLN: 0.2500 mg | INTRAMUSCULAR | Status: DC | PRN

## 2018-06-15 MED ORDER — LACTATED RINGERS IR SOLN
Status: DC | PRN
  Administered 2018-06-15: 1

## 2018-06-15 MED ORDER — SODIUM CHLORIDE 0.9 % IJ SOLN
INTRAMUSCULAR | Status: AC
  Filled 2018-06-15: qty 50

## 2018-06-15 MED ORDER — FENTANYL CITRATE (PF) 100 MCG/2ML IJ SOLN
INTRAMUSCULAR | Status: DC | PRN
  Administered 2018-06-15 (×3): 50 ug via INTRAVENOUS
  Administered 2018-06-15: 100 ug via INTRAVENOUS

## 2018-06-15 MED ORDER — LIDOCAINE 2% (20 MG/ML) 5 ML SYRINGE
INTRAMUSCULAR | Status: DC | PRN
  Administered 2018-06-15: 75 mg via INTRAVENOUS
  Administered 2018-06-15: 25 mg via INTRAVENOUS

## 2018-06-15 MED ORDER — FAMOTIDINE IN NACL 20-0.9 MG/50ML-% IV SOLN
20.0000 mg | Freq: Two times a day (BID) | INTRAVENOUS | Status: DC
  Administered 2018-06-15 (×2): 20 mg via INTRAVENOUS
  Filled 2018-06-15 (×3): qty 50

## 2018-06-15 MED ORDER — DEXAMETHASONE SODIUM PHOSPHATE 4 MG/ML IJ SOLN: 4.0000 mg | INTRAMUSCULAR | Status: DC

## 2018-06-15 MED ORDER — SODIUM CHLORIDE 0.9 % IV SOLN
2.0000 g | INTRAVENOUS | Status: AC
  Administered 2018-06-15: 2 g via INTRAVENOUS
  Filled 2018-06-15: qty 2

## 2018-06-15 MED ORDER — GABAPENTIN 100 MG PO CAPS
200.0000 mg | ORAL_CAPSULE | Freq: Two times a day (BID) | ORAL | Status: DC
  Administered 2018-06-15 – 2018-06-16 (×2): 200 mg via ORAL
  Filled 2018-06-15 (×2): qty 2

## 2018-06-15 MED ORDER — SUCCINYLCHOLINE CHLORIDE 200 MG/10ML IV SOSY
PREFILLED_SYRINGE | INTRAVENOUS | Status: DC | PRN
  Administered 2018-06-15: 160 mg via INTRAVENOUS

## 2018-06-15 MED ORDER — SODIUM CHLORIDE 0.9 % IJ SOLN
INTRAMUSCULAR | Status: DC | PRN
  Administered 2018-06-15: 50 mL

## 2018-06-15 MED ORDER — KETOROLAC TROMETHAMINE 30 MG/ML IJ SOLN
30.0000 mg | Freq: Once | INTRAMUSCULAR | Status: AC | PRN
  Administered 2018-06-15: 30 mg via INTRAVENOUS

## 2018-06-15 MED ORDER — LACTATED RINGERS IV SOLN
INTRAVENOUS | Status: DC
  Administered 2018-06-15 (×2): via INTRAVENOUS

## 2018-06-15 MED ORDER — ACETAMINOPHEN 500 MG PO TABS
1000.0000 mg | ORAL_TABLET | ORAL | Status: AC
  Administered 2018-06-15: 1000 mg via ORAL
  Filled 2018-06-15: qty 2

## 2018-06-15 MED ORDER — ONDANSETRON HCL 4 MG/2ML IJ SOLN
INTRAMUSCULAR | Status: DC | PRN
  Administered 2018-06-15: 4 mg via INTRAVENOUS

## 2018-06-15 MED ORDER — SUGAMMADEX SODIUM 500 MG/5ML IV SOLN
INTRAVENOUS | Status: AC
  Filled 2018-06-15: qty 5

## 2018-06-15 MED ORDER — BUPIVACAINE LIPOSOME 1.3 % IJ SUSP
INTRAMUSCULAR | Status: DC | PRN
  Administered 2018-06-15: 20 mL

## 2018-06-15 MED ORDER — PROPOFOL 10 MG/ML IV BOLUS
INTRAVENOUS | Status: DC | PRN
  Administered 2018-06-15: 250 mg via INTRAVENOUS

## 2018-06-15 MED ORDER — PROMETHAZINE HCL 25 MG/ML IJ SOLN: 12.5000 mg | Freq: Four times a day (QID) | INTRAMUSCULAR | Status: DC | PRN

## 2018-06-15 MED ORDER — MORPHINE SULFATE (PF) 2 MG/ML IV SOLN: 1.0000 mg | INTRAVENOUS | Status: DC | PRN

## 2018-06-15 MED ORDER — ROCURONIUM BROMIDE 10 MG/ML (PF) SYRINGE
PREFILLED_SYRINGE | INTRAVENOUS | Status: DC | PRN
  Administered 2018-06-15: 60 mg via INTRAVENOUS
  Administered 2018-06-15 (×2): 10 mg via INTRAVENOUS

## 2018-06-15 MED ORDER — DIPHENHYDRAMINE HCL 50 MG/ML IJ SOLN: 12.5000 mg | Freq: Three times a day (TID) | INTRAMUSCULAR | Status: DC | PRN

## 2018-06-15 MED ORDER — DEXAMETHASONE SODIUM PHOSPHATE 10 MG/ML IJ SOLN
INTRAMUSCULAR | Status: AC
  Filled 2018-06-15: qty 1

## 2018-06-15 MED ORDER — ONDANSETRON HCL 4 MG/2ML IJ SOLN: INTRAMUSCULAR | Status: DC | PRN

## 2018-06-15 MED ORDER — KCL IN DEXTROSE-NACL 20-5-0.45 MEQ/L-%-% IV SOLN
INTRAVENOUS | Status: DC
  Administered 2018-06-15 – 2018-06-16 (×3): via INTRAVENOUS
  Filled 2018-06-15 (×3): qty 1000

## 2018-06-15 MED ORDER — LIDOCAINE HCL 2 % IJ SOLN
INTRAMUSCULAR | Status: AC
  Filled 2018-06-15: qty 20

## 2018-06-15 SURGICAL SUPPLY — 67 items
APPLICATOR COTTON TIP 6 STRL (MISCELLANEOUS) IMPLANT
APPLICATOR COTTON TIP 6IN STRL (MISCELLANEOUS)
APPLIER CLIP ROT 10 11.4 M/L (STAPLE)
APPLIER CLIP ROT 13.4 12 LRG (CLIP)
BANDAGE ADH SHEER 1  50/CT (GAUZE/BANDAGES/DRESSINGS) ×12 IMPLANT
BENZOIN TINCTURE PRP APPL 2/3 (GAUZE/BANDAGES/DRESSINGS) ×2 IMPLANT
BLADE SURG SZ11 CARB STEEL (BLADE) ×2 IMPLANT
CABLE HIGH FREQUENCY MONO STRZ (ELECTRODE) ×2 IMPLANT
CHLORAPREP W/TINT 26ML (MISCELLANEOUS) ×4 IMPLANT
CLIP APPLIE ROT 10 11.4 M/L (STAPLE) IMPLANT
CLIP APPLIE ROT 13.4 12 LRG (CLIP) IMPLANT
CLSR STERI-STRIP ANTIMIC 1/2X4 (GAUZE/BANDAGES/DRESSINGS) ×2 IMPLANT
COVER SURGICAL LIGHT HANDLE (MISCELLANEOUS) ×2 IMPLANT
COVER WAND RF STERILE (DRAPES) IMPLANT
DECANTER SPIKE VIAL GLASS SM (MISCELLANEOUS) ×2 IMPLANT
DEVICE SUT QUICK LOAD TK 5 (STAPLE) IMPLANT
DEVICE SUT TI-KNOT TK 5X26 (MISCELLANEOUS) IMPLANT
DEVICE SUTURE ENDOST 10MM (ENDOMECHANICALS) IMPLANT
DISSECTOR BLUNT TIP ENDO 5MM (MISCELLANEOUS) IMPLANT
DRAPE UTILITY XL STRL (DRAPES) ×4 IMPLANT
ELECT L-HOOK LAP 45CM DISP (ELECTROSURGICAL)
ELECT PENCIL ROCKER SW 15FT (MISCELLANEOUS) IMPLANT
ELECT REM PT RETURN 15FT ADLT (MISCELLANEOUS) ×2 IMPLANT
ELECTRODE L-HOOK LAP 45CM DISP (ELECTROSURGICAL) IMPLANT
GAUZE SPONGE 2X2 8PLY STRL LF (GAUZE/BANDAGES/DRESSINGS) IMPLANT
GAUZE SPONGE 4X4 12PLY STRL (GAUZE/BANDAGES/DRESSINGS) IMPLANT
GLOVE BIO SURGEON STRL SZ7.5 (GLOVE) ×2 IMPLANT
GLOVE INDICATOR 8.0 STRL GRN (GLOVE) ×2 IMPLANT
GOWN STRL REUS W/TWL XL LVL3 (GOWN DISPOSABLE) ×6 IMPLANT
GRASPER SUT TROCAR 14GX15 (MISCELLANEOUS) ×2 IMPLANT
HOVERMATT SINGLE USE (MISCELLANEOUS) ×2 IMPLANT
KIT BASIN OR (CUSTOM PROCEDURE TRAY) ×2 IMPLANT
MARKER SKIN DUAL TIP RULER LAB (MISCELLANEOUS) ×2 IMPLANT
NEEDLE SPNL 22GX3.5 QUINCKE BK (NEEDLE) ×2 IMPLANT
PACK UNIVERSAL I (CUSTOM PROCEDURE TRAY) ×2 IMPLANT
RELOAD STAPLER 60MM BLK (STAPLE) IMPLANT
RELOAD STAPLER BLUE 60MM (STAPLE) ×4 IMPLANT
RELOAD STAPLER GOLD 60MM (STAPLE) ×1 IMPLANT
RELOAD STAPLER GREEN 60MM (STAPLE) ×1 IMPLANT
SCISSORS LAP 5X45 EPIX DISP (ENDOMECHANICALS) IMPLANT
SEALANT SURGICAL APPL DUAL CAN (MISCELLANEOUS) IMPLANT
SET IRRIG TUBING LAPAROSCOPIC (IRRIGATION / IRRIGATOR) ×2 IMPLANT
SHEARS HARMONIC ACE PLUS 45CM (MISCELLANEOUS) ×2 IMPLANT
SLEEVE GASTRECTOMY 40FR VISIGI (MISCELLANEOUS) ×2 IMPLANT
SLEEVE XCEL OPT CAN 5 100 (ENDOMECHANICALS) ×6 IMPLANT
SOLUTION ANTI FOG 6CC (MISCELLANEOUS) ×2 IMPLANT
SPONGE GAUZE 2X2 STER 10/PKG (GAUZE/BANDAGES/DRESSINGS)
SPONGE LAP 18X18 RF (DISPOSABLE) ×2 IMPLANT
STAPLER ECHELON BIOABSB 60 FLE (MISCELLANEOUS) ×12 IMPLANT
STAPLER ECHELON LONG 60 440 (INSTRUMENTS) ×2 IMPLANT
STAPLER RELOAD 60MM BLK (STAPLE)
STAPLER RELOAD BLUE 60MM (STAPLE) ×8
STAPLER RELOAD GOLD 60MM (STAPLE) ×2
STAPLER RELOAD GREEN 60MM (STAPLE) ×2
STRIP CLOSURE SKIN 1/2X4 (GAUZE/BANDAGES/DRESSINGS) ×2 IMPLANT
SUT MNCRL AB 4-0 PS2 18 (SUTURE) ×2 IMPLANT
SUT SURGIDAC NAB ES-9 0 48 120 (SUTURE) IMPLANT
SUT VICRYL 0 TIES 12 18 (SUTURE) ×2 IMPLANT
SYR 20CC LL (SYRINGE) ×2 IMPLANT
SYR 50ML LL SCALE MARK (SYRINGE) ×2 IMPLANT
TOWEL OR 17X26 10 PK STRL BLUE (TOWEL DISPOSABLE) ×2 IMPLANT
TOWEL OR NON WOVEN STRL DISP B (DISPOSABLE) ×2 IMPLANT
TROCAR BLADELESS 15MM (ENDOMECHANICALS) ×2 IMPLANT
TROCAR BLADELESS OPT 5 100 (ENDOMECHANICALS) ×2 IMPLANT
TUBING CONNECTING 10 (TUBING) ×4 IMPLANT
TUBING ENDO SMARTCAP (MISCELLANEOUS) ×2 IMPLANT
TUBING INSUF HEATED (TUBING) ×2 IMPLANT

## 2018-06-15 NOTE — Anesthesia Postprocedure Evaluation (Signed)
Anesthesia Post Note  Patient: Maria Bryan  Procedure(s) Performed: LAPAROSCOPIC GASTRIC SLEEVE RESECTION, UPPER ENDO, ERAS PATHWAY (N/A )     Patient location during evaluation: PACU Anesthesia Type: General Level of consciousness: awake and sedated Pain management: pain level controlled Vital Signs Assessment: post-procedure vital signs reviewed and stable Respiratory status: spontaneous breathing Cardiovascular status: stable Postop Assessment: no apparent nausea or vomiting Anesthetic complications: no    Last Vitals:  Vitals:   06/15/18 1040 06/15/18 1055  BP: 128/80 140/86  Pulse: 85 83  Resp: 16 16  Temp:  36.8 C  SpO2: 99% 92%    Last Pain:  Vitals:   06/15/18 1055  TempSrc: Oral  PainSc: 2    Pain Goal:                 Caren Macadam

## 2018-06-15 NOTE — Op Note (Signed)
Maria Bryan [id number] 08-26-1976 06/15/2018  Preoperative diagnosis: sleeve gastrectomy in progress  Postoperative diagnosis: Same   Procedure: Upper endoscopy   Surgeon: Susy Frizzle B. Daphine Bryan  M.D., FACS   Anesthesia: Gen.   Indications for procedure: This patient was undergoing a sleeve gastrectomy by Dr. Andrey Campanile.    Description of procedure: The endoscopy was placed in the mouth and into the oropharynx and under endoscopic vision it was advanced to the esophagogastric junction.  The pouch was insufflated and the sleeve was cylindrical and looked good.  The pylorus was identified and the scope was withdrawn with deflation..   No bleeding or leaks were detected.  The scope was withdrawn without difficulty.     Maria B. Daphine Deutscher, MD, FACS General, Bariatric, & Minimally Invasive Surgery [facility], [place]

## 2018-06-15 NOTE — Progress Notes (Signed)
XPHARMACY CONSULT FOR:  Risk Assessment for Post-Discharge VTE Following Bariatric Surgery  Post-Discharge VTE Risk Assessment: This patient's probability of 30-day post-discharge VTE is increased due to the factors marked:   Female    Age >/=60 years    BMI >/=50 kg/m2    CHF    Dyspnea at Rest    Paraplegia   X Non-gastric-band surgery    Operation Time >/=3 hr    Return to OR     Length of Stay >/= 3 d   Predicted probability of 30-day post-discharge VTE: 0.16%  Other patient-specific factors to consider:  none   Recommendation for Discharge: No pharmacologic prophylaxis post-discharge  Maria Bryan is a 42 y.o. female who underwent laparoscopic sleeve gastrectomy on 06/15/18  No Known Allergies  Patient Measurements: Height: 5\' 4"  (162.6 cm) Weight: 264 lb (119.7 kg) IBW/kg (Calculated) : 54.7 Body mass index is 45.32 kg/m.  No results for input(s): WBC, HGB, HCT, PLT, APTT, CREATININE, LABCREA, CREATININE, CREAT24HRUR, MG, PHOS, ALBUMIN, PROT, ALBUMIN, AST, ALT, ALKPHOS, BILITOT, BILIDIR, IBILI in the last 72 hours. Estimated Creatinine Clearance: 110 mL/min (by C-G formula based on SCr of 0.84 mg/dL).    Past Medical History:  Diagnosis Date  . Hypertension      Medications Prior to Admission  Medication Sig Dispense Refill Last Dose  . acetaminophen (TYLENOL) 500 MG tablet Take 1,000 mg by mouth daily as needed for moderate pain or headache.   Past Month at Unknown time  . amLODipine (NORVASC) 10 MG tablet Take 1 tablet (10 mg total) by mouth daily. 30 tablet 0 06/15/2018 at 0415  . Ascorbic Acid (CHEW-C) 500 MG CHEW Chew 500 mg by mouth daily.   Past Week at Unknown time  . Biotin 16109 MCG TABS Take 10,000 mcg by mouth daily.   Past Week at Unknown time  . cholecalciferol (VITAMIN D) 1000 units tablet Take 2,000 Units by mouth daily.   Past Week at Unknown time  . cyanocobalamin 500 MCG tablet Take 500 mcg by mouth daily.   Past Week at Unknown  time  . Dapsone (ACZONE) 7.5 % GEL Apply 1 application topically daily as needed (acne).    Past Week at Unknown time  . Fluocinolone Acetonide Scalp 0.01 % OIL Apply 1 application topically every 14 (fourteen) days.   Taking  . fluocinonide ointment (LIDEX) 0.05 % Apply 1 application topically every other day.   Past Week at Unknown time  . hydrocortisone 2.5 % cream Apply 1 application topically every other day.    Past Week at Unknown time  . labetalol (NORMODYNE) 200 MG tablet Take 200 mg by mouth 2 (two) times daily.   06/15/2018 at 0415  . magnesium oxide (MAG-OX) 400 MG tablet Take 400 mg by mouth daily.   Past Week at Unknown time  . Multiple Vitamin (MULTIVITAMIN) tablet Take 2 tablets by mouth daily.    Past Week at Unknown time  . tretinoin (RETIN-A) 0.025 % cream Apply 1 application topically at bedtime.   Past Week at Unknown time  . cyclobenzaprine (FLEXERIL) 10 MG tablet Take 10 mg by mouth daily as needed for muscle spasms.   More than a month at Unknown time  . PRESCRIPTION MEDICATION Apply 1 application topically daily as needed (pain). diclofenac 3% baclofen 2% lidocaine 5% Menthol 1%   More than a month at Unknown time  . traMADol (ULTRAM) 50 MG tablet Take 50 mg by mouth daily as needed for severe pain.  More than a month at Unknown time     Herby Abraham, Pharm.D [phone] 06/15/2018 11:18 AM

## 2018-06-15 NOTE — Anesthesia Procedure Notes (Signed)
Procedure Name: Intubation Date/Time: 06/15/2018 7:36 AM Performed by: Illene Silver, CRNA Pre-anesthesia Checklist: Patient identified, Emergency Drugs available, Suction available and Patient being monitored Patient Re-evaluated:Patient Re-evaluated prior to induction Oxygen Delivery Method: Circle system utilized Preoxygenation: Pre-oxygenation with 100% oxygen Induction Type: IV induction, Rapid sequence and Cricoid Pressure applied Laryngoscope Size: Glidescope and 4 Grade View: Grade III Tube type: Oral Number of attempts: 1 Airway Equipment and Method: Stylet and Video-laryngoscopy Placement Confirmation: ETT inserted through vocal cords under direct vision,  positive ETCO2 and breath sounds checked- equal and bilateral Secured at: 23 cm Tube secured with: Tape Dental Injury: Teeth and Oropharynx as per pre-operative assessment  Difficulty Due To: Difficulty was anticipated, Difficult Airway- due to anterior larynx, Difficult Airway- due to limited oral opening and Difficult Airway- due to large tongue Comments: Elective Glidescope. Able to visualize well with glidescope mac4 blade.redundant tissue in posterior  oralpharynx,deep

## 2018-06-15 NOTE — Interval H&P Note (Signed)
History and Physical Interval Note:  06/15/2018 7:25 AM  Maria Bryan  has presented today for surgery, with the diagnosis of Morbid Obesity, HTN, OSA, Joint Pain  The various methods of treatment have been discussed with the patient and family. After consideration of risks, benefits and other options for treatment, the patient has consented to  Procedure(s): LAPAROSCOPIC GASTRIC SLEEVE RESECTION, UPPER ENDO, ERAS PATHWAY (N/A) as a surgical intervention .  The patient's history has been reviewed, patient examined, no change in status, stable for surgery.  I have reviewed the patient's chart and labs.  Questions were answered to the patient's satisfaction.    Mary Sella. Andrey Campanile, MD, FACS General, Bariatric, & Minimally Invasive Surgery [facility] [place]  Gaynelle Adu

## 2018-06-15 NOTE — Transfer of Care (Signed)
Immediate Anesthesia Transfer of Care Note  Patient: Maria Bryan  Procedure(s) Performed: LAPAROSCOPIC GASTRIC SLEEVE RESECTION, UPPER ENDO, ERAS PATHWAY (N/A )  Patient Location: PACU  Anesthesia Type:General  Level of Consciousness: awake, alert , oriented and patient cooperative  Airway & Oxygen Therapy: Patient Spontanous Breathing and Patient connected to face mask oxygen  Post-op Assessment: Report given to RN, Post -op Vital signs reviewed and stable and Patient moving all extremities X 4  Post vital signs: stable  Last Vitals:  Vitals Value Taken Time  BP 123/72 06/15/2018  9:31 AM  Temp    Pulse 83 06/15/2018  9:34 AM  Resp 17 06/15/2018  9:34 AM  SpO2 99 % 06/15/2018  9:34 AM  Vitals shown include unvalidated device data.  Last Pain:  Vitals:   06/15/18 0608  TempSrc: Oral         Complications: No apparent anesthesia complications

## 2018-06-15 NOTE — Progress Notes (Signed)
Started pt on ice and water at 1425.  She has not complained of pain and has walked 2 laps around the unit.

## 2018-06-15 NOTE — Op Note (Signed)
06/15/2018 Maria Bryan [birth year] [id number]   PRE-OPERATIVE DIAGNOSIS:    Morbid obesity BMI 45   Essential hypertension   OSA (obstructive sleep apnea)   Chronic lumbosacral pain  POST-OPERATIVE DIAGNOSIS:  same  PROCEDURE:  Procedure(s): LAPAROSCOPIC SLEEVE GASTRECTOMY  UPPER GI ENDOSCOPY  SURGEON:  Surgeon(s): Atilano Ina, MD FACS FASMBS  ASSISTANTS: Luretha Murphy MD FACS  ANESTHESIA:   general  DRAINS: none   BOUGIE: 40 fr ViSiGi  LOCAL MEDICATIONS USED:   Exparel  EBL: minimal  SPECIMEN:  Source of Specimen:  Greater curvature of stomach  DISPOSITION OF SPECIMEN:  PATHOLOGY  COUNTS:  YES  INDICATION FOR PROCEDURE: This is a very pleasant 42 y.o.-year-old morbidly obese female who has had unsuccessful attempts for sustained weight loss. The patient presents today for a planned laparoscopic sleeve gastrectomy with upper endoscopy. We have discussed the risk and benefits of the procedure extensively preoperatively. Please see my separate notes.  PROCEDURE: After obtaining informed consent and receiving 5000 units of subcutaneous heparin, the patient was brought to the operating room at [facility] and placed supine on the operating room table. General endotracheal anesthesia was established. Sequential compression devices were placed. A orogastric tube was placed. The patient's abdomen was prepped and draped in the usual standard surgical fashion. The patient received preoperative IV antibiotic. A surgical timeout was performed. ERAS protocol used.   Access to the abdomen was achieved using a 5 mm 0 laparoscope thru a 5 mm trocar In the left upper Quadrant 2 fingerbreadths below the left subcostal margin using the Optiview technique. Pneumoperitoneum was smoothly established up to 15 mm of mercury. The laparoscope was advanced and the abdominal cavity was surveilled. The patient was then placed in reverse Trendelenburg.   A 5 mm trocar was placed  slightly above and to the left of the umbilicus under direct visualization.  The [facility] liver retractor was placed under the left lobe of the liver through a 5 mm trocar incision site in the subxiphoid position. A 5 mm trocar was placed in the lateral right upper quadrant along with a 15 mm trocar in the mid right abdomen. A final 5 mm trocar was placed in the lateral LUQ.  All under direct visualization after exparel had been infiltrated in bilateral lateral upper abdominal walls as a TAP block.  The stomach was inspected. It was completely decompressed and the orogastric tube was removed.  There was no anterior dimple that was obviously visible.Her preop UGI showed no hiatal hernia.   We identified the pylorus and measured 6 cm proximal to the pylorus and identified an area of where we would start taking down the short gastric vessels. Harmonic scalpel was used to take down the short gastric vessels along the greater curvature of the stomach. We were able to enter the lesser sac. We continued to march along the greater curvature of the stomach taking down the short gastrics. As we approached the gastrosplenic ligament we took care in this area not to injure the spleen. We were able to take down the entire gastrosplenic ligament. We then mobilized the fundus away from the left crus of diaphragm. There were not any significant posterior gastric avascular attachments. This left the stomach completely mobilized. No vessels had been taken down along the lesser curvature of the stomach.  We then reidentified the pylorus. A 40Fr ViSiGi was then placed in the oropharynx and advanced down into the stomach and placed in the distal antrum and positioned along the  lesser curvature. It was placed under suction which secured the 40Fr ViSiGi in place along the lesser curve. Then using the [employer] echelon 60 mm stapler with a green load with Seamguard, I placed a stapler along the antrum approximately 5 cm from the  pylorus. The stapler was angled so that there is ample room at the angularis incisura. I then fired the first staple load after inspecting it posteriorly to ensure adequate space both anteriorly and posteriorly. At this point I still was not completely past the angularis so with a gold load with Seamguard, I placed the stapler in position just inside the prior stapleline. We then rotated the stomach to insure that there was adequate anteriorly as well as posteriorly. The stapler was then fired.  At this point I started using 60 mm blue load staple cartridges with Seamguard. The echelon stapler was then repositioned with a 60 mm blue load with Seamguard and we continued to march up along the ViSiGi. My assistant was holding traction along the greater curvature stomach along the cauterized short gastric vessels ensuring that the stomach was symmetrically retracted. Prior to each firing of the staple, we rotated the stomach to ensure that there is adequate stomach left.  As we approached the fundus, I used 60 mm blue cartridge with Seamguard aiming  lateral to the GE junction after mobilizing some of the esophageal fat pad.  The sleeve was inspected. There is no evidence of cork screw. The staple line appeared hemostatic. The CRNA inflated the ViSiGi to the green zone and the upper abdomen was flooded with saline. There were no bubbles. The sleeve was decompressed and the ViSiGi removed. My assistant scrubbed out and performed an upper endoscopy. The sleeve easily distended with air and the scope was easily advanced to the pylorus. There is no evidence of internal bleeding or cork screwing. There was no narrowing at the angularis. There is no evidence of bubbles. Please see his operative note for further details. The gastric sleeve was decompressed and the endoscope was removed.  The greater curvature the stomach was grasped with a laparoscopic grasper and removed from the 15 mm trocar site.  The liver retractor was  removed. I then closed the 15 mm trocar site with 1 interrupted 0 Vicryl sutures through the fascia using the endoclose. The closure was viewed laparoscopically and it was airtight. Remaining Exparel was then infiltrated in the preperitoneal spaces around the trocar sites. Pneumoperitoneum was released. All trocar sites were closed with a 4-0 Monocryl in a subcuticular fashion followed by the application of benzoin, steri-strips, and bandaids. The patient was extubated and taken to the recovery room in stable condition. All needle, instrument, and sponge counts were correct x2. There are no immediate complications  (1) 60 mm green with Seamguard (1) 60 mm gold with seamguard (4) 60 mm blue with seamguard  PLAN OF CARE: Admit to inpatient   PATIENT DISPOSITION:  PACU - hemodynamically stable.   Delay start of Pharmacological VTE agent (>24hrs) due to surgical blood loss or risk of bleeding:  no  Mary Sella. Andrey Campanile, MD, FACS FASMBS General, Bariatric, & Minimally Invasive Surgery [facility], [place]

## 2018-06-15 NOTE — H&P (Signed)
Chaka D Lanphear Documented: 05/20/2018 8:42 AM Location: [facility] Patient #: [id number] DOB: 12/14/1975 Married / Language: English / Race: Black or African American Female  History of Present Illness (Lyberti Thrush M. Neeya Prigmore MD; 05/20/2018 9:04 AM) The patient is a 42 year old female who presents for a bariatric surgery evaluation. She comes in today for preoperative evaluation. I initially met her about 4 months ago for consideration of weight loss surgery. She has completed her evaluation process. She had undergone a sleep study that showed moderate to severe obstructive sleep apnea. She is in the process of getting set up for CPAP. She has also been seen and evaluated and cleared by cardiology. She also has completed her psychological evaluation. Her chest x-ray and upper GI were unremarkable. Her blood work from March 2019 showed normal kidney, liver and thyroid function. She had an normal CBC. No evidence of diabetes mellitus. Vitamin D level XXIV.7. Lipid panel was essentially normal-LDL 121. Total cholesterol 201.  She denies any chest pain, chest pressure, shortness of breath, orthopnea, paroxysmal nocturnal dyspnea, gerd or heartburn.   12/2017 She is referred by Dr Robyn Sanders for evaluation of weight loss surgery. She completed her seminar on line. She is interested in sleeve gastrectomy. She wants to be able to lose weight in order to be healthier. She also wants to have improvement in her joint and back pain. She wants to be able to participate in more activities with her small children. Despite numerous attempts for sustained weight loss she has been unsuccessful. She has tried Atkins, Weight Watchers several occasions, physician's weight loss, regular physical activity with Zumba classes-all without any long-term success.  She denies any chest pressure, chest tightness, chest pain. She has had some palpitations. She states that she wore a Holter monitor  for a month and it was negative.. She states her cardiologist felt that her palpitations were due to stress. She denies any peripheral edema, paroxysmal nocturnal dyspnea, dyspnea on exertion. She denies any personal or family history of blood clots. She just saw [facility] for evaluation for potential sleep apnea. She had a positive stop bang questionnaire. She is being scheduled for sleep study. She denies any heartburn, reflux, or vomiting. She denies any episodes of frequent abdominal pain. She denies any melena or hematochezia. She has a daily bowel movement. She has had a C-section 2 as well as a tubal ligation. She denies any dysuria or hematuria. She has chronic center low back pain without radiculopathy as well as bilateral knee pain. She has had physical therapy for neck pain. She denies any TIAs or amaurosis fugax. She denies any frequent headaches. She denies any tobacco or drug use. She may have a few glasses of wine per week. She works for home for [employer]. She is a lawyer.   Problem List/Past Medical (Aland Chestnutt M. 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 Jaleesa Cervi, MD; 05/20/2018 9:06 AM) Cesarean Section - Multiple Foot Surgery Left.  Diagnostic Studies History (Manpreet Kemmer M. Jasmeet Gehl, MD; 05/20/2018 9:06 AM) Colonoscopy never Mammogram within last year Pap Smear 1-5 years ago  Allergies (Tanisha A. Brown, RMA; 05/20/2018 8:43 AM) No Known Drug Allergies [01/08/2018]: Allergies Reconciled  Medication History (Tanisha A. Brown, RMA; 05/20/2018 8:43 AM) Labetalol HCl (200MG Tablet, Oral) Active. Vitamin   D (Ergocalciferol) (50000UNIT Capsule, Oral) Active. AmLODIPine  Besylate (10MG Tablet, Oral) Active.  Social History (Kainen Struckman M. Tiyanna Larcom, MD; 05/20/2018 9:06 AM) Alcohol use Occasional alcohol use. Caffeine use Coffee. No drug use Tobacco use Never smoker.  Family History (Quintell Bonnin M. Flois Mctague, MD; 05/20/2018 9:06 AM) Arthritis Mother. Diabetes Mellitus Mother. Hypertension Mother, Sister. Prostate Cancer Father.  Pregnancy / Birth History (Ruie Sendejo M. Naba Sneed, MD; 05/20/2018 9:06 AM) Age at menarche 10 years. Contraceptive History Oral contraceptives. Gravida 2 Length (months) of breastfeeding 7-12 Maternal age 36-40 Para 2 Regular periods  Other Problems (Mariadel Mruk M. Kelbie Moro, MD; 05/20/2018 9:06 AM) Arthritis Back Pain Chest pain High blood pressure Other disease, cancer, significant illness     Review of Systems (Kenda Kloehn M. Damarius Karnes MD; 05/20/2018 9:04 AM) General Present- Fatigue and Weight Gain. Not Present- Appetite Loss, Chills, Fever, Night Sweats and Weight Loss. Skin Not Present- Change in Wart/Mole, Dryness, Hives, Jaundice, New Lesions, Non-Healing Wounds, Rash and Ulcer. HEENT Present- Seasonal Allergies and Wears glasses/contact lenses. Not Present- Earache, Hearing Loss, Hoarseness, Nose Bleed, Oral Ulcers, Ringing in the Ears, Sinus Pain, Sore Throat, Visual Disturbances and Yellow Eyes. Respiratory Present- Snoring. Not Present- Bloody sputum, Chronic Cough, Difficulty Breathing and Wheezing. Breast Not Present- Breast Mass, Breast Pain, Nipple Discharge and Skin Changes. Cardiovascular Present- Leg Cramps, Palpitations and Shortness of Breath. Not Present- Chest Pain, Difficulty Breathing Lying Down, Rapid Heart Rate and Swelling of Extremities. Gastrointestinal Not Present- Abdominal Pain, Bloating, Bloody Stool, Change in Bowel Habits, Chronic diarrhea, Constipation, Difficulty Swallowing, Excessive gas, Gets full quickly at meals, Hemorrhoids, Indigestion, Nausea, Rectal Pain and Vomiting. Female Genitourinary Not Present-  Frequency, Nocturia, Painful Urination, Pelvic Pain and Urgency. Musculoskeletal Present- Back Pain, Joint Pain, Muscle Pain and Muscle Weakness. Not Present- Joint Stiffness and Swelling of Extremities. Neurological Present- Numbness and Tingling. Not Present- Decreased Memory, Fainting, Headaches, Seizures, Tremor, Trouble walking and Weakness. Psychiatric Not Present- Anxiety, Bipolar, Change in Sleep Pattern, Depression, Fearful and Frequent crying. Endocrine Not Present- Cold Intolerance, Excessive Hunger, Hair Changes, Heat Intolerance, Hot flashes and New Diabetes. Hematology Not Present- Blood Thinners, Easy Bruising, Excessive bleeding, Gland problems, HIV and Persistent Infections.  Vitals (Tanisha A. Brown RMA; 05/20/2018 8:43 AM) 05/20/2018 8:42 AM Weight: 273.6 lb Height: 65in Body Surface Area: 2.26 m Body Mass Index: 45.53 kg/m  Temp.: 98.7F  Pulse: 91 (Regular)  BP: 128/86 (Sitting, Left Arm, Standard)      Physical Exam (Cooper Stamp M. Katelan Hirt MD; 05/20/2018 9:05 AM)  General Mental Status-Alert. General Appearance-Consistent with stated age. Hydration-Well hydrated. Voice-Normal.  Head and Neck Head-normocephalic, atraumatic with no lesions or palpable masses. Trachea-midline. Thyroid Gland Characteristics - normal size and consistency.  Eye Eyeball - Bilateral-Extraocular movements intact. Sclera/Conjunctiva - Bilateral-No scleral icterus.  ENMT Ears -Note:normal ext ears.  Mouth and Throat -Note:lips intact.   Chest and Lung Exam Chest and lung exam reveals -quiet, even and easy respiratory effort with no use of accessory muscles and on auscultation, normal breath sounds, no adventitious sounds and normal vocal resonance. Inspection Chest Wall - Normal. Back - normal.  Breast - Did not examine.  Cardiovascular Cardiovascular examination reveals -normal heart sounds, regular rate and rhythm with no murmurs and normal  pedal pulses bilaterally.  Abdomen Inspection Inspection of the abdomen reveals - No Hernias. Skin - Scar - Note: old c/s scar. Palpation/Percussion Palpation and Percussion of the abdomen reveal - Soft, Non Tender, No Rebound tenderness, No Rigidity (guarding) and No hepatosplenomegaly. Auscultation Auscultation of the abdomen reveals - Bowel   sounds normal.  Peripheral Vascular Upper Extremity Palpation - Pulses bilaterally normal.  Neurologic Neurologic evaluation reveals -alert and oriented x 3 with no impairment of recent or remote memory. Mental Status-Normal.  Neuropsychiatric The patient's mood and affect are described as -normal. Judgment and Insight-insight is appropriate concerning matters relevant to self.  Musculoskeletal Normal Exam - Left-Upper Extremity Strength Normal and Lower Extremity Strength Normal. Normal Exam - Right-Upper Extremity Strength Normal and Lower Extremity Strength Normal.  Lymphatic Head & Neck  General Head & Neck Lymphatics: Bilateral - Description - Normal. Axillary - Did not examine. Femoral & Inguinal - Did not examine.    Assessment & Plan (Romy Mcgue M. Ilham Roughton MD; 05/20/2018 9:06 AM)  MORBID (SEVERE) OBESITY DUE TO EXCESS CALORIES (E66.01) Impression: We reviewed her bariatric surgery evaluation pathway. We reviewed her results. We discussed her lab results. We also discussed her sleep study. We discussed outcomes with sleeve gastrectomy. We discussed behavior traits that have been found to lead to long-term success such as dietary compliance, limiting fast food, regular physical activity. She'll be scheduled for a preoperative class once we obtain insurance verification. All of her questions were asked and answered. 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 Massiel Stipp, MD, FACS General, Bariatric, & Minimally Invasive Surgery [facility]   

## 2018-06-15 NOTE — Discharge Instructions (Signed)
° ° ° °GASTRIC BYPASS/SLEEVE ° Home Care Instructions ° ° These instructions are to help you care for yourself when you go home. ° °Call: If you have any problems. °• Call [phone] and ask for the surgeon on call °• If you need immediate help, come to the ER at [facility].  °• Tell the ER staff that you are a new post-op gastric bypass or gastric sleeve patient °  °Signs and symptoms to report: • Severe vomiting or nausea °o If you cannot keep down clear liquids for longer than 1 day, call your surgeon  °• Abdominal pain that does not get better after taking your pain medication °• Fever over 100.4° F with chills °• Heart beating over 100 beats a minute °• Shortness of breath at rest °• Chest pain °•  Redness, swelling, drainage, or foul odor at incision (surgical) sites °•  If your incisions open or pull apart °• Swelling or pain in calf (lower leg) °• Diarrhea (Loose bowel movements that happen often), frequent watery, uncontrolled bowel movements °• Constipation, (no bowel movements for 3 days) if this happens: Pick one °o Milk of Magnesia, 2 tablespoons by mouth, 3 times a day for 2 days if needed °o Stop taking Milk of Magnesia once you have a bowel movement °o Call your doctor if constipation continues °Or °o Miralax  (instead of Milk of Magnesia) following the label instructions °o Stop taking Miralax once you have a bowel movement °o Call your doctor if constipation continues °• Anything you think is not normal °  °Normal side effects after surgery: • Unable to sleep at night or unable to focus °• Irritability or moody °• Being tearful (crying) or depressed °These are common complaints, possibly related to your anesthesia medications that put you to sleep, stress of surgery, and change in lifestyle.  This usually goes away a few weeks after surgery.  If these feelings continue, call your primary care doctor. °  °Wound Care: You may have surgical glue, steri-strips, or staples over your incisions after  surgery °• Surgical glue:  Looks like a clear film over your incisions and will wear off a little at a time °• Steri-strips: Strips of tape over your incisions. You may notice a yellowish color on the skin under the steri-strips. This is used to make the   steri-strips stick better. Do not pull the steri-strips off - let them fall off °• Staples: Staples may be removed before you leave the hospital °o If you go home with staples, call [facility], [phone] at for an appointment with your surgeon’s nurse to have staples removed 10 days after surgery. °• Showering: You may shower two (2) days after your surgery unless your surgeon tells you differently °o Wash gently around incisions with warm soapy water, rinse well, and gently pat dry  °o No tub baths until staples are removed, steri-strips fall off or glue is gone.  °  °Medications: • Medications should be liquid or crushed if larger than the size of a dime °• Extended release pills (medication that release a little bit at a time through the day) should NOT be crushed or cut. (examples include XL, ER, DR, SR) °• Depending on the size and number of medications you take, you may need to space (take a few throughout the day)/change the time you take your medications so that you do not over-fill your pouch (smaller stomach) °• Make sure you follow-up with your primary care doctor to   make medication changes needed during rapid weight loss and life-style changes °• If you have diabetes, follow up with the doctor that orders your diabetes medication(s) within one week after surgery and check your blood sugar regularly. °• Do not drive while taking prescription pain medication  °• It is ok to take Tylenol by the bottle instructions with your pain medicine or instead of your pain medicine as needed.  DO NOT TAKE NSAIDS (EXAMPLES OF NSAIDS:  IBUPROFREN/ NAPROXEN)  °Diet:                    First 2 Weeks ° You will see the dietician t about two (2) weeks  after your surgery. The dietician will increase the types of foods you can eat if you are handling liquids well: °• If you have severe vomiting or nausea and cannot keep down clear liquids lasting longer than 1 day, call your surgeon @ ([phone]) °Protein Shake °• Drink at least 2 ounces of shake 5-6 times per day °• Each serving of protein shakes (usually 8 - 12 ounces) should have: °o 15 grams of protein  °o And no more than 5 grams of carbohydrate  °• Goal for protein each day: °o Men = 80 grams per day °o Women = 60 grams per day °• Protein powder may be added to fluids such as non-fat milk or Lactaid milk or unsweetened Soy/Almond milk (limit to 35 grams added protein powder per serving) ° °Hydration °• Slowly increase the amount of water and other clear liquids as tolerated (See Acceptable Fluids) °• Slowly increase the amount of protein shake as tolerated  °•  Sip fluids slowly and throughout the day.  Do not use straws. °• May use sugar substitutes in small amounts (no more than 6 - 8 packets per day; i.e. Splenda) ° °Fluid Goal °• The first goal is to drink at least 8 ounces of protein shake/drink per day (or as directed by the nutritionist); some examples of protein shakes are Syntrax Nectar, Adkins Advantage, EAS Edge HP, and Unjury. See handout from pre-op Bariatric Education Class: °o Slowly increase the amount of protein shake you drink as tolerated °o You may find it easier to slowly sip shakes throughout the day °o It is important to get your proteins in first °• Your fluid goal is to drink 64 - 100 ounces of fluid daily °o It may take a few weeks to build up to this °• 32 oz (or more) should be clear liquids  °And  °• 32 oz (or more) should be full liquids (see below for examples) °• Liquids should not contain sugar, caffeine, or carbonation ° °Clear Liquids: °• Water or Sugar-free flavored water (i.e. Fruit H2O, Propel) °• Decaffeinated coffee or tea (sugar-free) °• Crystal Lite, Wyler’s Lite,  Minute Maid Lite °• Sugar-free Jell-O °• Bouillon or broth °• Sugar-free Popsicle:   *Less than 20 calories each; Limit 1 per day ° °Full Liquids: °Protein Shakes/Drinks + 2 choices per day of other full liquids °• Full liquids must be: °o No More Than 15 grams of Carbs per serving  °o No More Than 3 grams of Fat per serving °• Strained low-fat cream soup (except Cream of Potato or Tomato) °• Non-Fat milk °• Fat-free Lactaid Milk °• Unsweetened Soy Or Unsweetened Almond Milk °• Low Sugar yogurt (Dannon Lite & Fit, Greek yogurt; Oikos Triple Zero; Chobani Simply 100; Yoplait 100 calorie Greek - No Fruit on the Bottom) ° °  °Vitamins   and Minerals • Start 1 day after surgery unless otherwise directed by your surgeon °• 2 Chewable Bariatric Specific Multivitamin / Multimineral Supplement with iron (Example: Bariatric Advantage Multi EA) °• Chewable Calcium with Vitamin D-3 °(Example: 3 Chewable Calcium Plus 600 with Vitamin D-3) °o Take 500 mg three (3) times a day for a total of 1500 mg each day °o Do not take all 3 doses of calcium at one time as it may cause constipation, and you can only absorb 500 mg  at a time  °o Do not mix multivitamins containing iron with calcium supplements; take 2 hours apart °• Menstruating women and those with a history of anemia (a blood disease that causes weakness) may need extra iron °o Talk with your doctor to see if you need more iron °• Do not stop taking or change any vitamins or minerals until you talk to your dietitian or surgeon °• Your Dietitian and/or surgeon must approve all vitamin and mineral supplements °  °Activity and Exercise: Limit your physical activity as instructed by your doctor.  It is important to continue walking at home.  During this time, use these guidelines: °• Do not lift anything greater than ten (10) pounds for at least two (2) weeks °• Do not go back to work or drive until your surgeon says you can °• You may have sex when you feel comfortable  °o It is  VERY important for female patients to use a reliable birth control method; fertility often increases after surgery  °o All hormonal birth control will be ineffective for 30 days after surgery due to medications given during surgery a barrier method must be used. °o Do not get pregnant for at least 18 months °• Start exercising as soon as your doctor tells you that you can °o Make sure your doctor approves any physical activity °• Start with a simple walking program °• Walk 5-15 minutes each day, 7 days per week.  °• Slowly increase until you are walking 30-45 minutes per day °Consider joining our BELT program. [phone] or email [email or web address] °  °Special Instructions Things to remember: °• Use your CPAP when sleeping if this applies to you ° °• [facility] has two free Bariatric Surgery Support Groups that meet monthly °o The 3rd Thursday of each month, 6 pm, [facility]  °o The 2nd Friday of each month, 11:45 am in the private dining room in the basement of [facility] °• It is very important to keep all follow up appointments with your surgeon, dietitian, primary care physician, and behavioral health practitioner °• Routine follow up schedule with your surgeon include appointments at 2-3 weeks, 6-8 weeks, 6 months, and 1 year at a minimum.  Your surgeon may request to see you more often.   °o After the first year, please follow up with your bariatric surgeon and dietitian at least once a year in order to maintain best weight loss results °[facility]: [phone] °[facility]: [phone] °Bariatric Nurse Coordinator: [phone] °  °   Reviewed and Endorsed  °by [facility] Patient Education Committee, June, 2016 °Edits Approved: Aug, 2018 ° ° ° °

## 2018-06-15 NOTE — Progress Notes (Signed)
Discussed post op day goals with patient including ambulation, IS, diet progression, pain, and nausea control.  Questions answered. 

## 2018-06-16 ENCOUNTER — Encounter (HOSPITAL_COMMUNITY): Payer: Self-pay | Admitting: General Surgery

## 2018-06-16 LAB — CBC WITH DIFFERENTIAL/PLATELET
Abs Immature Granulocytes: 0.05 10*3/uL (ref 0.00–0.07)
BASOS ABS: 0 10*3/uL (ref 0.0–0.1)
BASOS PCT: 0 %
EOS PCT: 0 %
Eosinophils Absolute: 0 10*3/uL (ref 0.0–0.5)
HCT: 34 % — ABNORMAL LOW (ref 36.0–46.0)
Hemoglobin: 10.7 g/dL — ABNORMAL LOW (ref 12.0–15.0)
Immature Granulocytes: 1 %
Lymphocytes Relative: 19 %
Lymphs Abs: 2.1 10*3/uL (ref 0.7–4.0)
MCH: 29.1 pg (ref 26.0–34.0)
MCHC: 31.5 g/dL (ref 30.0–36.0)
MCV: 92.4 fL (ref 80.0–100.0)
MONO ABS: 0.9 10*3/uL (ref 0.1–1.0)
Monocytes Relative: 8 %
NEUTROS ABS: 7.9 10*3/uL — AB (ref 1.7–7.7)
NRBC: 0 % (ref 0.0–0.2)
Neutrophils Relative %: 72 %
PLATELETS: 355 10*3/uL (ref 150–400)
RBC: 3.68 MIL/uL — AB (ref 3.87–5.11)
RDW: 14 % (ref 11.5–15.5)
WBC: 11 10*3/uL — AB (ref 4.0–10.5)

## 2018-06-16 LAB — COMPREHENSIVE METABOLIC PANEL
ALT: 16 U/L (ref 0–44)
ANION GAP: 6 (ref 5–15)
AST: 24 U/L (ref 15–41)
Albumin: 3.5 g/dL (ref 3.5–5.0)
Alkaline Phosphatase: 59 U/L (ref 38–126)
BUN: 8 mg/dL (ref 6–20)
CHLORIDE: 107 mmol/L (ref 98–111)
CO2: 24 mmol/L (ref 22–32)
Calcium: 8.7 mg/dL — ABNORMAL LOW (ref 8.9–10.3)
Creatinine, Ser: 0.91 mg/dL (ref 0.44–1.00)
GFR calc non Af Amer: >60 mL/min (ref >60)
Glucose, Bld: 121 mg/dL — ABNORMAL HIGH (ref 70–99)
POTASSIUM: 3.9 mmol/L (ref 3.5–5.1)
SODIUM: 137 mmol/L (ref 135–145)
Total Bilirubin: 0.6 mg/dL (ref 0.3–1.2)
Total Protein: 6.9 g/dL (ref 6.5–8.1)

## 2018-06-16 MED ORDER — PANTOPRAZOLE SODIUM 40 MG PO TBEC: 40.0000 mg | DELAYED_RELEASE_TABLET | Freq: Every day | ORAL | 0 refills | Status: DC

## 2018-06-16 MED ORDER — TRAMADOL HCL 50 MG PO TABS: 50.0000 mg | ORAL_TABLET | Freq: Four times a day (QID) | ORAL | 0 refills | Status: DC | PRN

## 2018-06-16 MED ORDER — ONDANSETRON 4 MG PO TBDP: 4.0000 mg | ORAL_TABLET | Freq: Four times a day (QID) | ORAL | 0 refills | Status: DC | PRN

## 2018-06-16 NOTE — Progress Notes (Signed)
Discharge instructions given to pt and all questions were answered. Pt taken down via wheelchair and was picked up by her husband.  

## 2018-06-16 NOTE — Plan of Care (Signed)

## 2018-06-16 NOTE — Progress Notes (Signed)
Patient alert and oriented, pain is controlled. Patient is tolerating fluids, advanced to protein shake today, patient is tolerating well.  Reviewed Gastric sleeve discharge instructions with patient and patient is able to articulate understanding.  Provided information on BELT program, Support Group and [facility] outpatient pharmacy. All questions answered, will continue to monitor.  Total fluid intake 780 Per dehydration protocol call back one week post op 

## 2018-06-16 NOTE — Progress Notes (Signed)
Patient ID: Maria Bryan, female   DOB: Apr 18, 1976, 42 y.o.   MRN: [id number]   Progress Note: Metabolic and Bariatric Surgery Service   Chief Complaint/Subjective: No signif c/o Tolerated water No real nausea Some mild upper abd discomfort Almost finished 1st protein shake  Objective: Vital signs in last 24 hours: Temp:  [97.6 F (36.4 C)-98.2 F (36.8 C)] 97.9 F (36.6 C) (10/22 0501) Pulse Rate:  [64-100] 81 (10/22 0501) Resp:  [13-24] 18 (10/22 0501) BP: (97-142)/(52-86) 97/52 (10/22 0501) SpO2:  [92 %-100 %] 97 % (10/22 0501) Weight:  [119.7 kg] 119.7 kg (10/21 0608) Last BM Date: 06/14/18  Intake/Output from previous day: 10/21 0701 - 10/22 0700 In: 4226.2 [P.O.:360; I.V.:3766.2; IV Piggyback:100] Out: 1455 [Urine:1450; Blood:5] Intake/Output this shift: Total I/O In: 1695 [P.O.:120; I.V.:1475; IV Piggyback:100] Out: 800 [Urine:800]  Lungs: cta, symm  Cardiovascular: reg  Abd: soft, min TTP, incisions ok. A little shadowing on Rt mid trocar site  Extremities: no edema, +SCDs  Neuro: nonfocal, alert, smiling  Lab Results: CBC  Recent Labs    06/15/18 1144 06/16/18 0421  WBC  --  11.0*  HGB 11.3* 10.7*  HCT 35.7* 34.0*  PLT  --  355   BMET Recent Labs    06/16/18 0421  NA 137  K 3.9  CL 107  CO2 24  GLUCOSE 121*  BUN 8  CREATININE 0.91  CALCIUM 8.7*   PT/INR No results for input(s): LABPROT, INR in the last 72 hours. ABG No results for input(s): PHART, HCO3 in the last 72 hours.  Invalid input(s): PCO2, PO2  Studies/Results:  Anti-infectives: Anti-infectives (From admission, onward)   Start     Dose/Rate Route Frequency Ordered Stop   06/15/18 0600  cefoTEtan (CEFOTAN) 2 g in sodium chloride 0.9 % 100 mL IVPB     2 g 200 mL/hr over 30 Minutes Intravenous On call to O.R. 06/15/18 0551 06/15/18 0801      Medications: Scheduled Meds: . acetaminophen (TYLENOL) oral liquid 160 mg/5 mL  650 mg Oral Q6H  . amLODipine  10  mg Oral Daily  . enoxaparin (LOVENOX) injection  30 mg Subcutaneous Q12H  . gabapentin  200 mg Oral Q12H  . labetalol  200 mg Oral BID  . protein supplement shake  2 oz Oral Q2H   Continuous Infusions: . dextrose 5 % and 0.45 % NaCl with KCl 20 mEq/L 125 mL/hr at 06/15/18 2119  . famotidine (PEPCID) IV 20 mg (06/15/18 2121)   PRN Meds:.diphenhydrAMINE, morphine injection, ondansetron (ZOFRAN) IV, oxyCODONE, promethazine, simethicone  Assessment/Plan: Patient Active Problem List   Diagnosis Date Noted  . OSA (obstructive sleep apnea) 06/15/2018  . Chronic lumbosacral pain 06/15/2018  . Essential hypertension 05/24/2018  . Heart palpitations 01/06/2018  . Snoring 01/06/2018  . Morbid obesity (HCC) 01/06/2018   s/p Procedure(s): LAPAROSCOPIC GASTRIC SLEEVE RESECTION, UPPER ENDO, ERAS PATHWAY 06/15/2018  Doing well Tolerating water/shake No fever, no tachycardia Mild wbc - prob reactive. No clinical concerns for leak Start dc teaching Believe she will be able to go home later this am discusssed importance of ambulation after dc and keeping in touch with [place] Disposition:  LOS: 1 day  The patient should be discharged from the hospital today  Gaynelle Adu, MD [phone] [facility]

## 2018-06-16 NOTE — Addendum Note (Signed)
Addendum  created 06/16/18 0720 by Elyn Peers, CRNA   Charge Capture section accepted, Visit diagnoses modified

## 2018-06-17 ENCOUNTER — Ambulatory Visit: Payer: Self-pay | Admitting: Neurology

## 2018-06-17 NOTE — Discharge Summary (Signed)
Physician Discharge Summary  Maria Bryan YIF:[id number] DOB: [birth year] DOA: 06/15/2018  PCP: Glendale Chard, MD  Admit date: 06/15/2018 Discharge date: 06/16/2018  Recommendations for Outpatient Follow-up:   Follow-up Information    Greer Pickerel, MD. Go on 07/09/2018.   Specialty:  General Surgery Why:  at [facility] information: [address] [place] [address] [phone]        Carlena Hurl, PA-C. Go on 07/28/2018.   Specialty:  General Surgery Why:  at 2 pm Contact information: [place] [address] [phone]          Discharge Diagnoses:  Principal Problem:   Morbid obesity ([place]) Active Problems:   Essential hypertension   OSA (obstructive sleep apnea)   Chronic lumbosacral pain   Surgical Procedure: Laparoscopic Sleeve Gastrectomy, upper endoscopy  Discharge Condition: Good Disposition: Home  Diet recommendation: Postoperative sleeve gastrectomy diet (liquids only)  Filed Weights   06/15/18 0608 06/16/18 0612  Weight: 119.7 kg 121 kg     Hospital Course:  The patient was admitted for a planned laparoscopic sleeve gastrectomy. Please see operative note. Preoperatively the patient was given 5000 units of subcutaneous heparin for DVT prophylaxis. Postoperative prophylactic Lovenox dosing was started on the evening of postoperative day 0. ERAS protocol was used. On the evening of postoperative day 0, the patient was started on water and ice chips. On postoperative day 1 the patient had no fever or tachycardia and was tolerating water in their diet was gradually advanced throughout the day. The patient was ambulating without difficulty. Their vital signs are stable without fever or tachycardia. Their hemoglobin had remained stable. The patient was maintained on their home settings for CPAP therapy. The patient had received discharge instructions and counseling. They were deemed stable for discharge and had  met discharge criteria   Discharge Instructions  Discharge Instructions    Ambulate hourly while awake   Complete by:  As directed    Call MD for:  difficulty breathing, headache or visual disturbances   Complete by:  As directed    Call MD for:  persistant dizziness or light-headedness   Complete by:  As directed    Call MD for:  persistant nausea and vomiting   Complete by:  As directed    Call MD for:  redness, tenderness, or signs of infection (pain, swelling, redness, odor or green/yellow discharge around incision site)   Complete by:  As directed    Call MD for:  severe uncontrolled pain   Complete by:  As directed    Call MD for:  temperature >101 F   Complete by:  As directed    Diet bariatric full liquid   Complete by:  As directed    Discharge instructions   Complete by:  As directed    See bariatric discharge instructions   Incentive spirometry   Complete by:  As directed    Perform hourly while awake     Allergies as of 06/16/2018   No Known Allergies     Medication List    TAKE these medications   acetaminophen 500 MG tablet Commonly known as:  TYLENOL Take 1,000 mg by mouth daily as needed for moderate pain or headache.   ACZONE 7.5 % Gel Generic drug:  Dapsone Apply 1 application topically daily as needed (acne).   amLODipine 10 MG tablet Commonly known as:  NORVASC Take 1 tablet (10 mg total) by mouth daily. Notes to patient:  Monitor Blood Pressure Daily and keep a log for primary care physician.  You may need to make changes to your medications with rapid weight loss.     Biotin 10000 MCG Tabs Take 10,000 mcg by mouth daily.   CHEW-C 500 MG Chew Generic drug:  Ascorbic Acid Chew 500 mg by mouth daily.   cholecalciferol 1000 units tablet Commonly known as:  VITAMIN D Take 2,000 Units by mouth daily.   cyclobenzaprine 10 MG tablet Commonly known as:  FLEXERIL Take 10 mg by mouth daily as needed for muscle spasms.   Fluocinolone  Acetonide Scalp 0.01 % Oil Apply 1 application topically every 14 (fourteen) days.   fluocinonide ointment 0.05 % Commonly known as:  LIDEX Apply 1 application topically every other day.   hydrocortisone 2.5 % cream Apply 1 application topically every other day.   labetalol 200 MG tablet Commonly known as:  NORMODYNE Take 200 mg by mouth 2 (two) times daily. Notes to patient:  Monitor Blood Pressure Daily and keep a log for primary care physician.  You may need to make changes to your medications with rapid weight loss.     magnesium oxide 400 MG tablet Commonly known as:  MAG-OX Take 400 mg by mouth daily.   multivitamin tablet Take 2 tablets by mouth daily.   ondansetron 4 MG disintegrating tablet Commonly known as:  ZOFRAN-ODT Take 1 tablet (4 mg total) by mouth every 6 (six) hours as needed for nausea or vomiting.   pantoprazole 40 MG tablet Commonly known as:  PROTONIX Take 1 tablet (40 mg total) by mouth daily.   PRESCRIPTION MEDICATION Apply 1 application topically daily as needed (pain). diclofenac 3% baclofen 2% lidocaine 5% Menthol 1%   traMADol 50 MG tablet Commonly known as:  ULTRAM Take 1 tablet (50 mg total) by mouth every 6 (six) hours as needed for severe pain. What changed:  when to take this   tretinoin 0.025 % cream Commonly known as:  RETIN-A Apply 1 application topically at bedtime.   vitamin B-12 500 MCG tablet Commonly known as:  CYANOCOBALAMIN Take 500 mcg by mouth daily.      Follow-up Information    Greer Pickerel, MD. Go on 07/09/2018.   Specialty:  General Surgery Why:  at [facility] information: [address] [place] [address] [phone]        Carlena Hurl, PA-C. Go on 07/28/2018.   Specialty:  General Surgery Why:  at 2 pm Contact information: [place] [address] [phone]            The results of significant diagnostics from this hospitalization (including imaging,  microbiology, ancillary and laboratory) are listed below for reference.    Significant Diagnostic Studies: No results found.  Labs: Basic Metabolic Panel: Recent Labs  Lab 06/10/18 0947 06/16/18 0421  NA 141 137  K 4.0 3.9  CL 107 107  CO2 24 24  GLUCOSE 100* 121*  BUN 13 8  CREATININE 0.84 0.91  CALCIUM 9.3 8.7*   Liver Function Tests: Recent Labs  Lab 06/10/18 0947 06/16/18 0421  AST 19 24  ALT 13 16  ALKPHOS 83 59  BILITOT 0.6 0.6  PROT 7.4 6.9  ALBUMIN 4.0 3.5    CBC: Recent Labs  Lab 06/10/18 0947 06/15/18 1144 06/16/18 0421  WBC 5.8  --  11.0*  NEUTROABS 2.7  --  7.9*  HGB 11.3* 11.3* 10.7*  HCT 35.6* 35.7* 34.0*  MCV 91.5  --  92.4  PLT 376  --  355    CBG: No results for input(s): GLUCAP in the last 168 hours.  Principal Problem:   Morbid obesity ([place]) Active Problems:   Essential hypertension   OSA (obstructive sleep apnea)   Chronic lumbosacral pain   Time coordinating discharge: 15 min  Signed:  Gayland Curry, MD [facility] [place] 06/17/2018, 9:10 AM

## 2018-06-22 ENCOUNTER — Telehealth (HOSPITAL_COMMUNITY): Payer: Self-pay

## 2018-06-22 NOTE — Telephone Encounter (Signed)
Patient called to discuss post bariatric surgery follow up questions.  See below:   1.  Tell me about your pain and pain management?taken 4 ultram at night helps not problem  2.  Let's talk about fluid intake.  How much total fluid are you taking in?sipping all day,  64 ounces of fluid   3.  How much protein have you taken in the last 2 days?60 grams  4.  Have you had nausea?  Tell me about when have experienced nausea and what you did to help?no nausea medication  5.  Has the frequency or color changed with your urine?urinating frequently  6.  Tell me what your incisions look like?no problems  7.  Have you been passing gas? BM?bms every day since surgery  8.  If a problem or question were to arise who would you call?  Do you know contact numbers for [facility], [facility], and [facility]?aware of how to contact all services  9.  How has the walking going?walking frequently  10.  How are your vitamins and calcium going?  How are you taking them?taking them per schedule

## 2018-06-30 ENCOUNTER — Encounter: Payer: Federal, State, Local not specified - PPO | Attending: General Surgery | Admitting: Registered"

## 2018-06-30 DIAGNOSIS — Z713 Dietary counseling and surveillance: Secondary | ICD-10-CM | POA: Diagnosis not present

## 2018-06-30 DIAGNOSIS — Z6841 Body Mass Index (BMI) 40.0 and over, adult: Secondary | ICD-10-CM | POA: Insufficient documentation

## 2018-06-30 DIAGNOSIS — E669 Obesity, unspecified: Secondary | ICD-10-CM

## 2018-07-01 NOTE — Progress Notes (Signed)
Bariatric Class:  Appt start time: 1530 end time:  1630.  2 Week Post-Operative Nutrition Class  Patient was seen on 06/30/2018 for Post-Operative Nutrition education at [facility].   Surgery date: 06/15/2018 Surgery type: Sleeve Start weight at [facility]: 269.4 Weight today: 251.6 Weight change: 17.8  Pt states she is consuming 64+ fluid ounces a day. Pt states she has some gas and abdominal pain at incisional site.   TANITA  BODY COMP RESULTS  06/30/2018   BMI (kg/m^2) 42.5   Fat Mass (lbs) 130.4   Fat Free Mass (lbs) 121.2   Total Body Water (lbs) 89.2   The following the learning objectives were met by the patient during this course:  Identifies Phase 3A (Soft, High Proteins) Dietary Goals and will begin from 2 weeks post-operatively to 2 months post-operatively  Identifies appropriate sources of fluids and proteins   States protein recommendations and appropriate sources post-operatively  Identifies the need for appropriate texture modifications, mastication, and bite sizes when consuming solids  Identifies appropriate multivitamin and calcium sources post-operatively  Describes the need for physical activity post-operatively and will follow MD recommendations  States when to call healthcare provider regarding medication questions or post-operative complications  Handouts given during class include:  Phase 3A: Soft, High Protein Diet Handout  Follow-Up Plan: Patient will follow-up at [facility] in 6 weeks for 2 month post-op nutrition visit for diet advancement per MD.

## 2018-07-02 DIAGNOSIS — L219 Seborrheic dermatitis, unspecified: Secondary | ICD-10-CM | POA: Diagnosis not present

## 2018-07-02 DIAGNOSIS — L700 Acne vulgaris: Secondary | ICD-10-CM | POA: Diagnosis not present

## 2018-07-03 ENCOUNTER — Other Ambulatory Visit: Payer: Self-pay | Admitting: Internal Medicine

## 2018-07-06 ENCOUNTER — Telehealth: Payer: Self-pay | Admitting: Skilled Nursing Facility1

## 2018-07-06 NOTE — Telephone Encounter (Signed)
RD called pt to verify fluid intake once starting soft, solid proteins 2 week post-bariatric surgery.   Daily Fluid intake: 64 Daily Protein intake: chicken, deli meat, eggs, bacon, sausage, cheese: 60  Concerns/issues: Balancing when to drink surrounding meals.   Pt state she has been logging her intake. Pt states she is experiencing hunger.   "Maria Bryan"

## 2018-07-09 ENCOUNTER — Ambulatory Visit: Payer: Federal, State, Local not specified - PPO | Admitting: Internal Medicine

## 2018-07-09 ENCOUNTER — Encounter: Payer: Self-pay | Admitting: Internal Medicine

## 2018-07-09 VITALS — BP 112/78 | HR 87 | Temp 98.1°F | Ht 64.5 in | Wt 246.6 lb

## 2018-07-09 DIAGNOSIS — Z09 Encounter for follow-up examination after completed treatment for conditions other than malignant neoplasm: Secondary | ICD-10-CM

## 2018-07-09 DIAGNOSIS — I10 Essential (primary) hypertension: Secondary | ICD-10-CM | POA: Diagnosis not present

## 2018-07-09 DIAGNOSIS — G4733 Obstructive sleep apnea (adult) (pediatric): Secondary | ICD-10-CM

## 2018-07-09 DIAGNOSIS — Z9884 Bariatric surgery status: Secondary | ICD-10-CM | POA: Diagnosis not present

## 2018-07-09 DIAGNOSIS — Z6841 Body Mass Index (BMI) 40.0 and over, adult: Secondary | ICD-10-CM

## 2018-07-10 LAB — BMP8+EGFR
BUN/Creatinine Ratio: 11 (ref 9–23)
BUN: 9 mg/dL (ref 6–24)
CALCIUM: 9.4 mg/dL (ref 8.7–10.2)
CHLORIDE: 100 mmol/L (ref 96–106)
CO2: 23 mmol/L (ref 20–29)
Creatinine, Ser: 0.82 mg/dL (ref 0.57–1.00)
GFR calc Af Amer: 101 mL/min/{1.73_m2} (ref >59)
GFR calc non Af Amer: 87 mL/min/{1.73_m2} (ref >59)
Glucose: 85 mg/dL (ref 65–99)
POTASSIUM: 3.7 mmol/L (ref 3.5–5.2)
Sodium: 138 mmol/L (ref 134–144)

## 2018-07-10 NOTE — Progress Notes (Signed)
Hello!  Your kidney function is stable. Happy holidays to you and your family!  Sincerely,    Marializ Ferrebee N. Allyne GeeSanders, MD

## 2018-07-12 ENCOUNTER — Encounter: Payer: Self-pay | Admitting: Internal Medicine

## 2018-07-12 NOTE — Progress Notes (Signed)
Subjective:     Patient ID: Maria Bryan , female    DOB: [birth year] , 42 y.o.   MRN: [id number]   Chief Complaint  Patient presents with  . Bariatric surgery follow-up  . Hypertension    HPI  She is here today for f/u after bariatric surgery. She had gastric sleeve performed on October 21st. She feels well to date .She already feels lighter and feels that she has more energy. She is going for her first post-op check later today with Dr. Redmond Pulling.   Hypertension  This is a chronic problem. The current episode started more than 1 year ago. The problem has been gradually improving since onset. The problem is controlled.   She has felt dizzy so she has cut back on her meds.   Past Medical History:  Diagnosis Date  . Hypertension      Family History  Problem Relation Age of Onset  . Diabetes Mother   . Hypertension Mother   . Peripheral Artery Disease Mother   . Hypertension Father   . Prostate cancer Father   . Hypertension Sister   . Diabetes Maternal Aunt   . Hypertension Maternal Grandmother   . Diabetes Maternal Aunt   . Lung cancer Maternal Aunt      Current Outpatient Medications:  .  acetaminophen (TYLENOL) 500 MG tablet, Take 1,000 mg by mouth daily as needed for moderate pain or headache., Disp: , Rfl:  .  calcium carbonate (CALCIUM 600) 600 MG TABS tablet, Take by mouth., Disp: , Rfl:  .  Clindamycin-Benzoyl Per, Refr, gel, , Disp: , Rfl:  .  cyclobenzaprine (FLEXERIL) 10 MG tablet, Take 10 mg by mouth daily as needed for muscle spasms., Disp: , Rfl:  .  Dapsone (ACZONE) 7.5 % GEL, Apply 1 application topically daily as needed (acne). , Disp: , Rfl:  .  Fluocinolone Acetonide Scalp 0.01 % OIL, Apply 1 application topically every 14 (fourteen) days., Disp: , Rfl:  .  fluocinonide ointment (LIDEX) 8.29 %, Apply 1 application topically every other day., Disp: , Rfl:  .  hydrocortisone 2.5 % cream, Apply 1 application topically every other day. , Disp: ,  Rfl:  .  labetalol (NORMODYNE) 200 MG tablet, TAKE 1 TABLET BY MOUTH TWICE A DAY, Disp: 180 tablet, Rfl: 0 .  Multiple Vitamins-Minerals (OPURITY PO), Take 1 capsule by mouth., Disp: , Rfl:  .  pantoprazole (PROTONIX) 40 MG tablet, Take 1 tablet (40 mg total) by mouth daily., Disp: 90 tablet, Rfl: 0 .  PRESCRIPTION MEDICATION, Apply 1 application topically daily as needed (pain). diclofenac 3% baclofen 2% lidocaine 5% Menthol 1%, Disp: , Rfl:  .  traMADol (ULTRAM) 50 MG tablet, Take 1 tablet (50 mg total) by mouth every 6 (six) hours as needed for severe pain., Disp: 12 tablet, Rfl: 0 .  tretinoin (RETIN-A) 0.025 % cream, Apply 1 application topically at bedtime., Disp: , Rfl:  .  Ascorbic Acid (CHEW-C) 500 MG CHEW, Chew 500 mg by mouth daily., Disp: , Rfl:  .  Biotin 10000 MCG TABS, Take 10,000 mcg by mouth daily., Disp: , Rfl:  .  cholecalciferol (VITAMIN D) 1000 units tablet, Take 2,000 Units by mouth daily., Disp: , Rfl:  .  cyanocobalamin 500 MCG tablet, Take 500 mcg by mouth daily., Disp: , Rfl:  .  magnesium oxide (MAG-OX) 400 MG tablet, Take 400 mg by mouth daily., Disp: , Rfl:    No Known Allergies   Review of Systems  Constitutional:  Negative.   Respiratory: Negative.   Cardiovascular: Negative.   Gastrointestinal: Negative.   Genitourinary: Negative.   Hematological: Negative.   Psychiatric/Behavioral: Negative.      Today's Vitals   07/09/18 1040  BP: 112/78  Pulse: 87  Temp: 98.1 F (36.7 C)  Weight: 246 lb 9.6 oz (111.9 kg)  Height: 5' 4.5" (1.638 m)   Body mass index is 41.68 kg/m.   Objective:  Physical Exam  Constitutional: She is oriented to person, place, and time. She appears well-developed and well-nourished.  HENT:  Head: Normocephalic and atraumatic.  Eyes: EOM are normal.  Cardiovascular: Normal rate, regular rhythm and normal heart sounds.  Pulmonary/Chest: Effort normal and breath sounds normal.  Neurological: She is alert and oriented to person,  place, and time.  Psychiatric: She has a normal mood and affect.  Nursing note and vitals reviewed.       Assessment And Plan:     1. Essential hypertension, benign  Controlled. She is advised to take 1/2 labetalol twice daily.  She will let me know if her dizziness returns. She is encouraged to stay well hydrated.    - BMP8+EGFR  2. OSA (obstructive sleep apnea)  Chronic. She is encouraged to continue with CPap nightly as directed. She is encouraged to wear nightly for at least six hours per night.   3. Class 3 severe obesity due to excess calories with serious comorbidity and body mass index (BMI) of 40.0 to 44.9 in adult [facility])  She has lost greater than 20 pounds. She is encouraged to incorporate some exercise into her daily routine. She is congratulated on her weight loss thus far and encouraged to keep up the great work.   Maximino Greenland, MD

## 2018-07-18 DIAGNOSIS — R05 Cough: Secondary | ICD-10-CM | POA: Diagnosis not present

## 2018-07-18 DIAGNOSIS — R0981 Nasal congestion: Secondary | ICD-10-CM | POA: Diagnosis not present

## 2018-07-18 DIAGNOSIS — J0100 Acute maxillary sinusitis, unspecified: Secondary | ICD-10-CM | POA: Diagnosis not present

## 2018-08-10 ENCOUNTER — Ambulatory Visit: Payer: Federal, State, Local not specified - PPO | Admitting: Dietician

## 2018-08-10 ENCOUNTER — Ambulatory Visit: Payer: Federal, State, Local not specified - PPO | Admitting: Skilled Nursing Facility1

## 2018-08-11 ENCOUNTER — Encounter: Payer: Self-pay | Admitting: Skilled Nursing Facility1

## 2018-08-11 ENCOUNTER — Encounter: Payer: Federal, State, Local not specified - PPO | Attending: General Surgery | Admitting: Skilled Nursing Facility1

## 2018-08-11 DIAGNOSIS — Z6841 Body Mass Index (BMI) 40.0 and over, adult: Secondary | ICD-10-CM | POA: Diagnosis not present

## 2018-08-11 DIAGNOSIS — Z713 Dietary counseling and surveillance: Secondary | ICD-10-CM | POA: Diagnosis not present

## 2018-08-11 NOTE — Progress Notes (Signed)
Follow-up visit:  8 Weeks Post-Operative Sleeve Surgery  Primary concerns today: Post-operative Bariatric Surgery Nutrition Management.  Pt states she has not been weighing and has gotten through her struggling with feeling like she has not lost enough weight. Pt states she is trying to focus on her clothes fitting and feeling better rather than getting upset with her weight. Pt states she realizes she cannot eat fast and take big gulps of fluid. Pt states she threw up once due to driving and eating. Pt sates she keeps a protein shake in the car to avoid eating out.  Pt states her husband is very supportive and cooks all of her meals.  Pt states she is moving to [place] but will come back for follow up with dietitian and surgeon once a year.   Surgery date: 06/15/2018 Surgery type: Sleeve Start weight at [facility]: 269.4 Weight today: 241.4 Weight change: 10.2  TANITA  BODY COMP RESULTS  06/30/2018 08/11/2018   BMI (kg/m^2) 42.5 40.8   Fat Mass (lbs) 130.4 118   Fat Free Mass (lbs) 121.2 123.4   Total Body Water (lbs) 89.2 90.4   24-hr recall: B (AM): egg and sausage and cheese Snk (AM): sometimes some more of breakfast  L (PM): deli [place] or chicken and cheese or peporoni and cheese  Snk (PM):  D (PM): steakem and cheese or steak or burger and cheese Snk (PM):   Fluid intake: water and powerade zero: 72 Estimated total protein intake: 60+  Medications: see list Supplementation: bariatric advantage and calcium   Using straws: no Drinking while eating: no Having you been chewing well: yes Chewing/swallowing difficulties: no Changes in vision: no Changes to mood/headaches: no Hair loss/Cahnges to skin/Changes to nails: no Any difficulty focusing or concentrating: no Sweating: on Dizziness/Lightheaded: no Palpitations: no  Carbonated beverages: no N/V/D/C/GAS: no Abdominal Pain: no Dumping syndrome: no  Recent physical activity:  BELT  Progress Towards Goal(s):  In  progress.  Handouts given during visit include:  Non starchy veggies and snack sheet   Nutritional Diagnosis:  [place]-3.3 Overweight/obesity related to past poor dietary habits and physical inactivity as evidenced by patient w/ recent Sleeve surgery following dietary guidelines for continued weight loss. Intervention:  Nutrition counseling. Pts diet was advanced to the next phase now including non starchy. Due to the bodies need for essential vitamins, minerals, and fats the pt was educated on the need to consume a certain amount of calories as well as certain nutrients daily. Pt was educated on the need for daily physical activity and to reach a goal of at least 150 minutes of moderate to vigorous physical activity as directed by their physician due to such benefits as increased musculature and improved lab values.   Goals: -Try the capsule multivitamin  -Continue to aim for a minimum of 64 fluid ounces 7 days a week with at least 30 ounces being plain water -Eat non-starchy vegetables 2 times a day 7 days a week -Start out with soft cooked vegetables today and tomorrow; if tolerated begin to eat raw vegetables or cooked including salads -Eat your 3 ounces of protein first then start in on your non-starchy vegetables; once you understand how much of your meal leads to satisfaction and not full while still eating 3 ounces of protein and non-starchy vegetables you can eat them in any order  -Continue to aim for 30 minutes of activity at least 5 times a week -Think about having lean meats throughout the week such as seafood  and beans and lentils   -Limit your cheese to 1-2 times a day Teaching Method Utilized:  Visual Auditory Hands on  Barriers to learning/adherence to lifestyle change: none identified   Demonstrated degree of understanding via:  Teach Back   Monitoring/Evaluation:  Dietary intake, exercise, and body weight. Follow up in 4 month

## 2018-08-11 NOTE — Patient Instructions (Signed)
-  Try the capsule multivitamin   -Continue to aim for a minimum of 64 fluid ounces 7 days a week with at least 30 ounces being plain water  -Eat non-starchy vegetables 2 times a day 7 days a week  -Start out with soft cooked vegetables today and tomorrow; if tolerated begin to eat raw vegetables or cooked including salads  -Eat your 3 ounces of protein first then start in on your non-starchy vegetables; once you understand how much of your meal leads to satisfaction and not full while still eating 3 ounces of protein and non-starchy vegetables you can eat them in any order   -Continue to aim for 30 minutes of activity at least 5 times a week   -Think about having lean meats throughout the week such as seafood and beans and lentils   -Limit your cheese to 1-2 times a day

## 2018-09-28 ENCOUNTER — Other Ambulatory Visit: Payer: Self-pay | Admitting: Internal Medicine

## 2018-10-13 ENCOUNTER — Ambulatory Visit: Payer: Federal, State, Local not specified - PPO | Admitting: Internal Medicine

## 2018-11-09 ENCOUNTER — Ambulatory Visit: Payer: Federal, State, Local not specified - PPO | Admitting: Skilled Nursing Facility1

## 2018-12-03 ENCOUNTER — Encounter: Payer: Federal, State, Local not specified - PPO | Attending: Internal Medicine | Admitting: Skilled Nursing Facility1

## 2018-12-03 ENCOUNTER — Telehealth: Payer: Self-pay | Admitting: Skilled Nursing Facility1

## 2018-12-03 NOTE — Progress Notes (Signed)
Pt states she does not want to come in for a one on one appointment. This is a phone appointment.  Pt states she has been stalled at the same weight since February. Pt states she weighs about 1 time a week and using how her clothes feel.    24 hr recall:  Herbal tea sometimes with a protein shake in it [facility] or sausage and egg with cheese belvita Protein bar with milk or protein shake Deli ham and cheese  Hamburger with bun with corn   Beverages: water, sugar free juice Protein: 60+  Physical activity: walking 3-5 days a week; 30-45 minutes leisure   Intervention:  -Eat non starchy vegetables 2 times a day 7 days a week  -Drink 64 fluid ounces minimum a day -Be intentional with the foods you eat and aware of when you are eating not due to hunger    Call was lost in the middle of conversation: dietitian attempted to call pt back twice with no answer and pt has a full mailbox

## 2018-12-03 NOTE — Telephone Encounter (Signed)
Pt states she does not want to come in for a one on one appointment.  Pt states she has been stalled at the same weight since February. Pt states she weighs about 1 time a week and using how her clothes feel.    24 hr recall:  Herbal tea sometimes with a protein shake in it [facility] or sausage and egg with cheese belvita Protein bar with milk or protein shake Deli ham and cheese  Hamburger with bun with corn   Beverages: water, sugar free juice Protein: 60+  Physical activity: walking 3-5 days a week; 30-45 minutes leisure   Intervention:  -Eat non starchy vegetables 2 times a day 7 days a week  -Drink 64 fluid ounces minimum a day -Be intentional with the foods you eat and aware of when you are eating not due to hunger    Call was lost in the middle of conversation: dietitian attempted to call pt back twice with no answer and pt has a full mailbox

## 2019-07-05 ENCOUNTER — Telehealth: Payer: Self-pay | Admitting: *Deleted

## 2019-07-05 NOTE — Telephone Encounter (Signed)
Unable to leave a message.

## 2020-01-05 ENCOUNTER — Encounter (HOSPITAL_COMMUNITY): Payer: Self-pay

## 2020-05-21 IMAGING — RF DG UGI W/ KUB
10 of 18 series · 12 of 24 positions shown · non-contrast
Comparison: None.

CLINICAL DATA: Preoperative evaluation for bariatric surgery.

EXAM:
UPPER GI SERIES WITH KUB
TECHNIQUE: After obtaining a scout radiograph a routine upper GI series was
performed using thin and high density barium.
FLUOROSCOPY TIME:  Fluoroscopy Time: 54 seconds of low-dose pulsed
fluoroscopy.
Radiation Exposure Index (if provided by the fluoroscopic device):
30.1 mGy
Number of Acquired Spot Images: 0

[Series 2: cp_standard · 0.55mm/px · 2 of 72 frames shown (1 of 10)]
[frame 4/72]
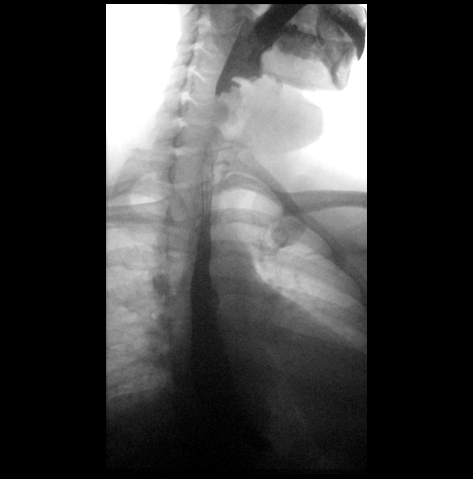
[frame 37/72]
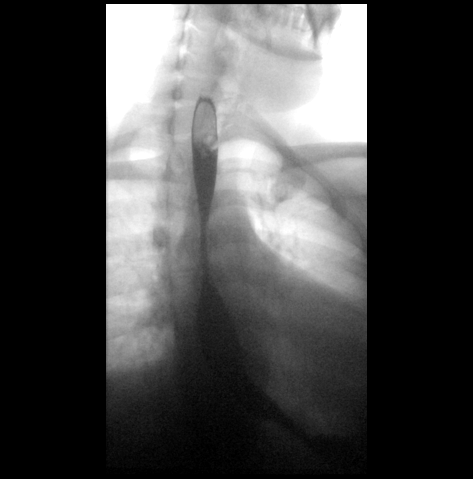

[Series 3: cp_standard · 0.27mm/px · 1 of 1 slices shown (2 of 10)]
[im 1/1]
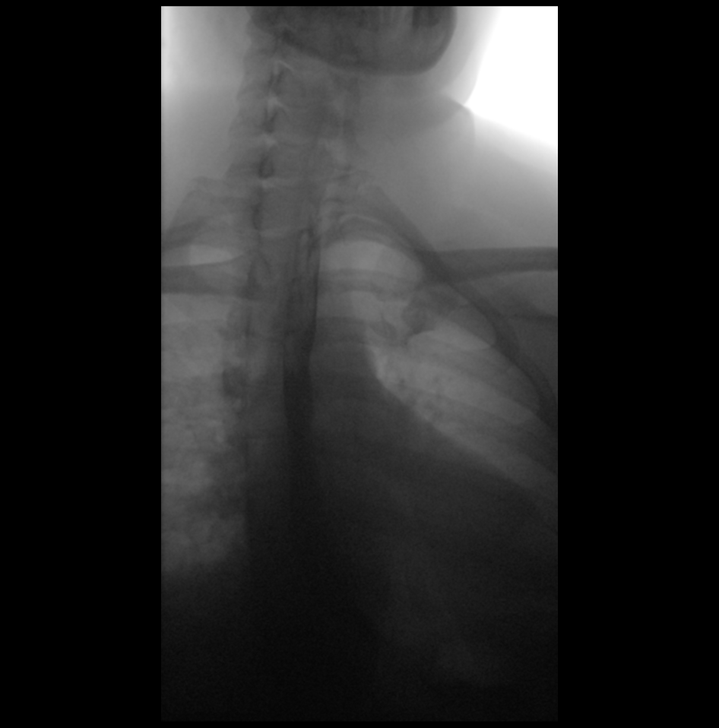

[Series 5: cp_standard · 0.27mm/px · 1 of 1 slices shown (3 of 10)]
[im 1/1]
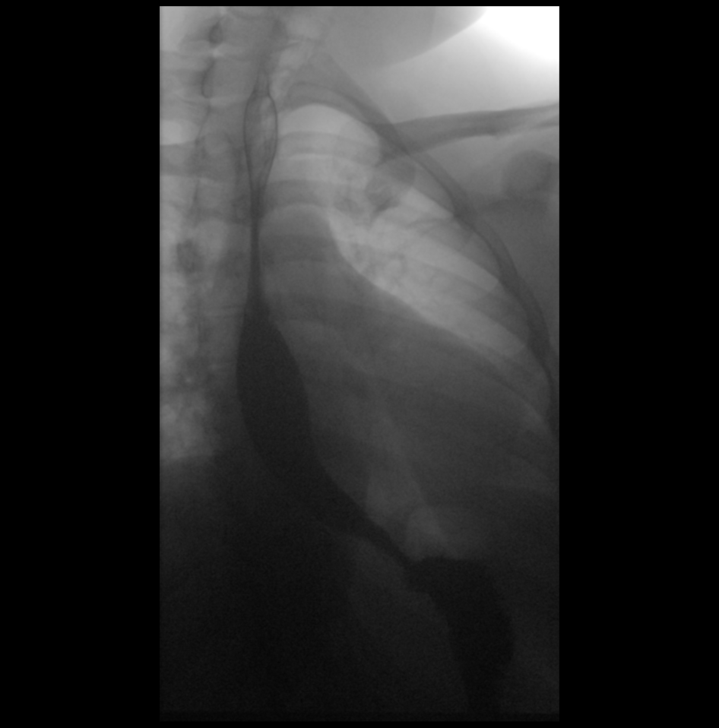

[Series 7: cp_standard · 0.21mm/px · 1 of 1 slices shown (4 of 10)]
[im 1/1]
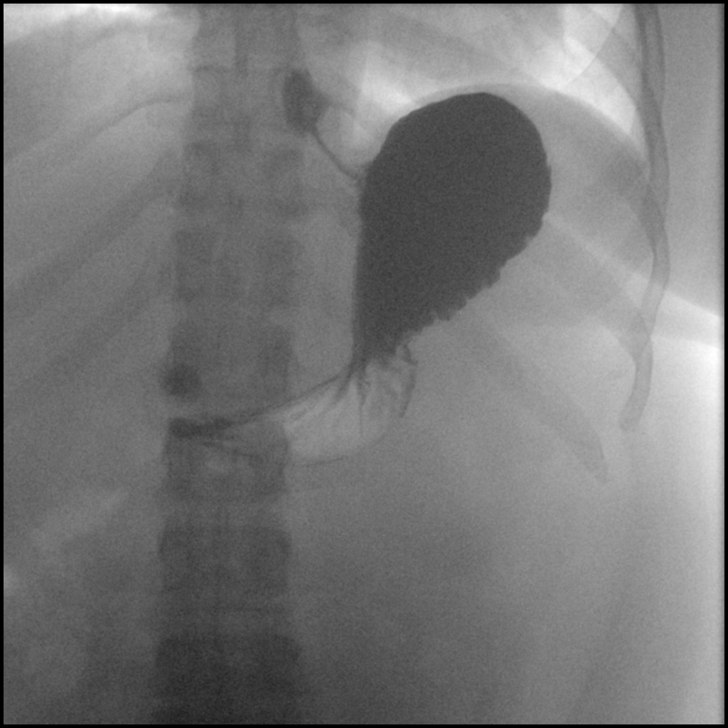

[Series 9: cp_standard · 0.19mm/px · 1 of 1 slices shown (5 of 10)]
[im 1/1]
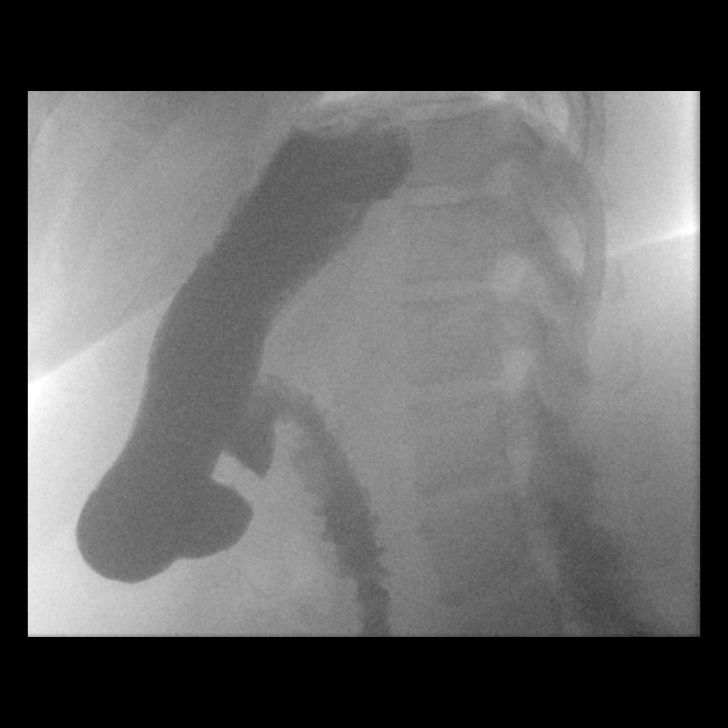

[Series 12: cp_standard · 0.21mm/px · 1 of 1 slices shown (6 of 10)]
[im 1/1]
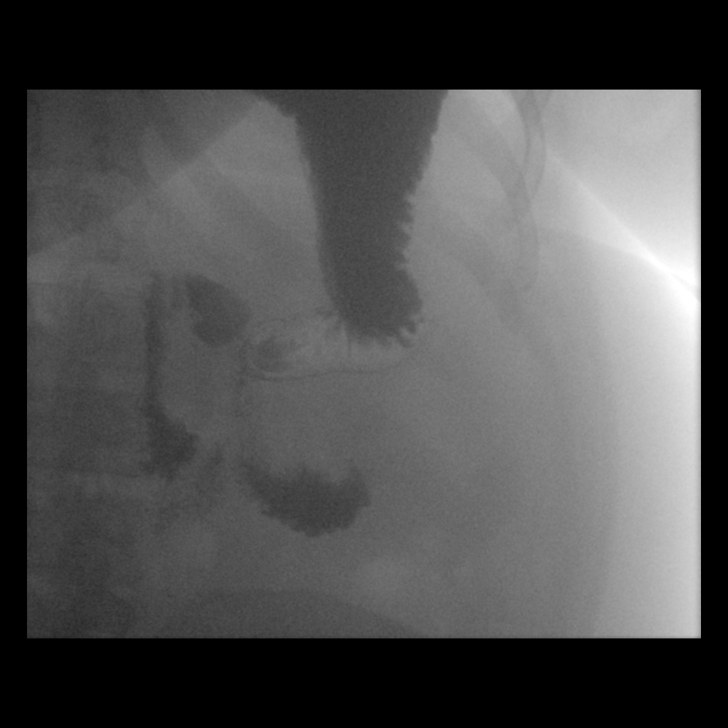

[Series 14: cp_standard · 0.21mm/px · 1 of 1 slices shown (7 of 10)]
[im 1/1]
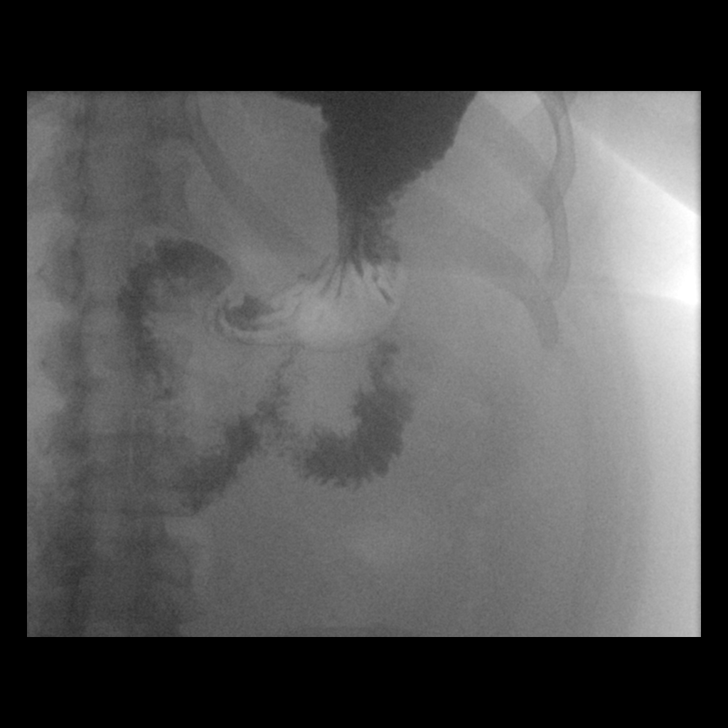

[Series 15: cp_standard · 0.39mm/px · 2 of 87 frames shown (8 of 10)]
[frame 14/87]
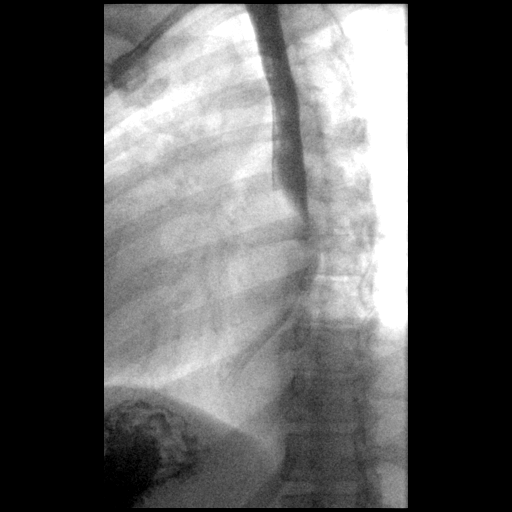
[frame 74/87]
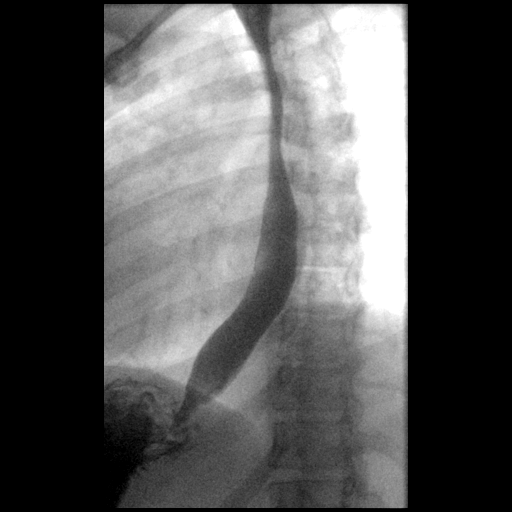

[Series 17: cp_standard · 0.21mm/px · 1 of 1 slices shown (9 of 10)]
[im 1/1]
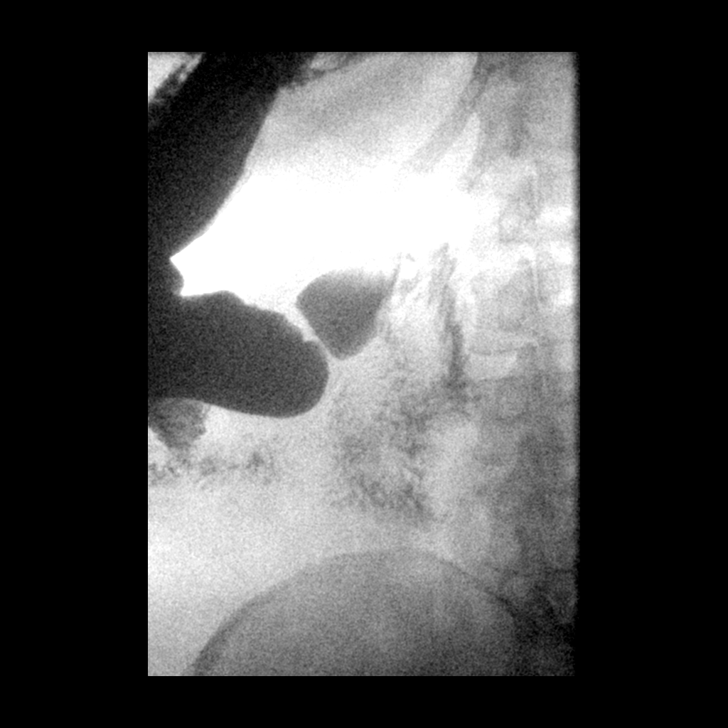

[Series 19: cp_standard · 0.21mm/px · 1 of 1 slices shown (10 of 10)]
[im 1/1]
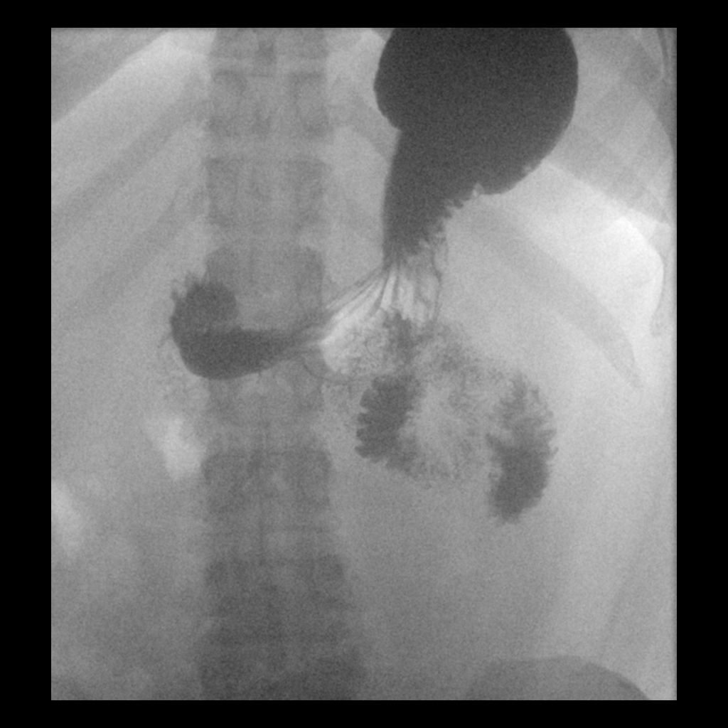

[12 of 24 positions shown; findings below may reference images not displayed]

FINDINGS: The patient swallowed the barium without difficulty. The esophageal
motility is normal. There is no evidence of stricture, mass,
ulceration or significant hiatal hernia. The stomach and duodenum
appear normal without ulceration. The ligament of Treitz is normally
positioned.
IMPRESSION: Normal upper GI series.

## 2021-05-30 ENCOUNTER — Ambulatory Visit: Payer: Federal, State, Local not specified - PPO | Admitting: Nurse Practitioner

## 2021-05-30 ENCOUNTER — Other Ambulatory Visit: Payer: Self-pay

## 2021-05-30 ENCOUNTER — Encounter: Payer: Self-pay | Admitting: Nurse Practitioner

## 2021-05-30 VITALS — BP 118/70 | HR 77 | Temp 98.1°F | Ht 64.2 in | Wt 218.4 lb

## 2021-05-30 DIAGNOSIS — Z6837 Body mass index (BMI) 37.0-37.9, adult: Secondary | ICD-10-CM

## 2021-05-30 DIAGNOSIS — Z1159 Encounter for screening for other viral diseases: Secondary | ICD-10-CM

## 2021-05-30 DIAGNOSIS — I10 Essential (primary) hypertension: Secondary | ICD-10-CM | POA: Diagnosis not present

## 2021-05-30 DIAGNOSIS — E559 Vitamin D deficiency, unspecified: Secondary | ICD-10-CM | POA: Diagnosis not present

## 2021-05-30 DIAGNOSIS — Z23 Encounter for immunization: Secondary | ICD-10-CM

## 2021-05-30 NOTE — Patient Instructions (Signed)

## 2021-05-30 NOTE — Progress Notes (Signed)
I,Tianna Badgett,acting as a Education administrator for [employer], NP.,have documented all relevant documentation on the behalf of [employer], NP,as directed by  Bary Castilla, NP while in the presence of Bary Castilla, NP.  This visit occurred during the SARS-CoV-2 public health emergency.  Safety protocols were in place, including screening questions prior to the visit, additional usage of staff PPE, and extensive cleaning of exam room while observing appropriate contact time as indicated for disinfecting solutions.  Subjective:     Patient ID: Maria Bryan , female    DOB: 17-Jan-1976 , 45 y.o.   MRN: [id number]   Chief Complaint  Patient presents with   Hypertension    HPI  Patient is here to reestablish care. SShe as seeing Dr. Baird Cancer in 2019. She wants to try not taking the amlodipine. She has not been taking it for couple of days. Her BP today is 118/70.  She is going to the [facility] appt. She is up to date on her mammogram. Scheduled one for Nov. She had a colonoscopy at 44. She has no other concerns.  She will come back for a physical exam in 3 months.   Hypertension Pertinent negatives include no chest pain, headaches, palpitations or shortness of breath.    Past Medical History:  Diagnosis Date   Hypertension    Obesity      Family History  Problem Relation Age of Onset   Diabetes Mother    Hypertension Mother    Peripheral Artery Disease Mother    Hypertension Father    Prostate cancer Father    Hypertension Sister    Diabetes Maternal Aunt    Hypertension Maternal Grandmother    Diabetes Maternal Aunt    Lung cancer Maternal Aunt      Current Outpatient Medications:    acetaminophen (TYLENOL) 500 MG tablet, Take 1,000 mg by mouth daily as needed for moderate pain or headache., Disp: , Rfl:    Biotin 10000 MCG TABS, Take 10,000 mcg by mouth daily., Disp: , Rfl:    calcium carbonate (OS-CAL) 600 MG TABS tablet, Take by mouth., Disp: , Rfl:     cholecalciferol (VITAMIN D) 1000 units tablet, Take 2,000 Units by mouth daily., Disp: , Rfl:    Clindamycin-Benzoyl Per, Refr, gel, , Disp: , Rfl:    cyclobenzaprine (FLEXERIL) 10 MG tablet, Take 10 mg by mouth daily as needed for muscle spasms., Disp: , Rfl:    Dapsone 7.5 % GEL, Apply 1 application topically daily as needed (acne). , Disp: , Rfl:    Fluocinolone Acetonide Scalp 0.01 % OIL, Apply 1 application topically every 14 (fourteen) days., Disp: , Rfl:    fluocinonide ointment (LIDEX) 1.69 %, Apply 1 application topically every other day., Disp: , Rfl:    hydrocortisone 2.5 % cream, Apply 1 application topically every other day. , Disp: , Rfl:    labetalol (NORMODYNE) 200 MG tablet, TAKE 1 TABLET BY MOUTH TWICE A DAY, Disp: 180 tablet, Rfl: 1   magnesium oxide (MAG-OX) 400 MG tablet, Take 400 mg by mouth daily., Disp: , Rfl:    Multiple Vitamins-Minerals (OPURITY PO), Take 1 capsule by mouth., Disp: , Rfl:    PRESCRIPTION MEDICATION, Apply 1 application topically daily as needed (pain). diclofenac 3% baclofen 2% lidocaine 5% Menthol 1%, Disp: , Rfl:    tretinoin (RETIN-A) 0.025 % cream, Apply 1 application topically at bedtime., Disp: , Rfl:    Allergies  Allergen Reactions   Nsaids  Review of Systems  Constitutional: Negative.  Negative for chills, fatigue and fever.  HENT:  Negative for congestion and sinus pressure.   Respiratory: Negative.  Negative for cough, shortness of breath and wheezing.   Cardiovascular: Negative.  Negative for chest pain and palpitations.  Gastrointestinal: Negative.   Neurological: Negative.  Negative for weakness, numbness and headaches.    Today's Vitals   05/30/21 0947  BP: 118/70  Pulse: 77  Temp: 98.1 F (36.7 C)  TempSrc: Oral  Weight: 218 lb 6.4 oz (99.1 kg)  Height: 5' 4.2" (1.631 m)   Body mass index is 37.26 kg/m.  Wt Readings from Last 3 Encounters:  05/30/21 218 lb 6.4 oz (99.1 kg)  08/11/18 241 lb 6.4 oz (109.5 kg)   07/09/18 246 lb 9.6 oz (111.9 kg)    Objective:  Physical Exam Constitutional:      Appearance: Normal appearance. She is obese.  HENT:     Head: Normocephalic and atraumatic.  Cardiovascular:     Rate and Rhythm: Normal rate and regular rhythm.     Pulses: Normal pulses.     Heart sounds: Normal heart sounds. No murmur heard. Pulmonary:     Effort: Pulmonary effort is normal. No respiratory distress.     Breath sounds: Normal breath sounds. No wheezing.  Skin:    General: Skin is warm and dry.     Capillary Refill: Capillary refill takes less than 2 seconds.  Neurological:     Mental Status: She is alert.        Assessment And Plan:     1. Essential hypertension -She  continues to take labetalol but has not taken the amlodipine in a couple of days. Her BP today was 118/70. She is not complaining of any HA, chest pain or SOB.  -She would like to be off amlodipine as her BP has been controlled.  -Will D/c amlodipine for now. She is advised to keep a BP log at home to check her BP once daily. She is encouraged to send [place] a msg with any elevation in her BP. She is also advised to eat a heart healthy diet along with exercise. Reduce stress.  -Will recheck in 3 months or sooner if needed  -Limit the intake of processed foods and salt intake. You should increase your intake of green vegetables and fruits. Limit the use of alcohol. Limit fast foods and fried foods. Avoid high fatty saturated and trans fat foods. Keep yourself hydrated with drinking water. Avoid red meats. Eat lean meats instead. Exercise for atleast 30-45 min for atleast 4-5 times a week.  - CBC no Diff - CMP14+EGFR  2. Vitamin D deficiency -Will check today and supplement if needed  - Vitamin D (25 hydroxy)  3. Need for influenza vaccination - Flu Vaccine QUAD 6+ mos PF IM (Fluarix Quad PF)  4. Encounter for hepatitis C screening test for low risk patient - Hepatitis C antibody  5. Class 2 severe obesity due  to excess calories with serious comorbidity and body mass index (BMI) of 37.0 to 37.9 in adult [facility]) Advised patient on a healthy diet including avoiding fast food and red meats. Increase the intake of lean meats including grilled chicken and [place].  Drink a lot of water. Decrease intake of fatty foods. Exercise for 30-45 min. 4-5 a week to decrease the risk of cardiac event.   The patient was encouraged to call or send a message through [place] for any questions or concerns.  Follow up: if symptoms persist or do not get better.   Side effects and appropriate use of all the medication(s) were discussed with the patient today. Patient advised to use the medication(s) as directed by their healthcare provider. The patient was encouraged to read, review, and understand all associated package inserts and contact our office with any questions or concerns. The patient accepts the risks of the treatment plan and had an opportunity to ask questions.   Patient was given opportunity to ask questions. Patient verbalized understanding of the plan and was able to repeat key elements of the plan. All questions were answered to their satisfaction.  Raman Dominica Kent, DNP   I, Raman Geena Weinhold have reviewed all documentation for this visit. The documentation on 05/30/21 for the exam, diagnosis, procedures, and orders are all accurate and complete.    IF YOU HAVE BEEN REFERRED TO A SPECIALIST, IT MAY TAKE 1-2 WEEKS TO SCHEDULE/PROCESS THE REFERRAL. IF YOU HAVE NOT HEARD FROM US/SPECIALIST IN TWO WEEKS, PLEASE GIVE [place] A CALL AT [phone].   THE PATIENT IS ENCOURAGED TO PRACTICE SOCIAL DISTANCING DUE TO THE COVID-19 PANDEMIC.

## 2021-05-31 LAB — CMP14+EGFR
ALT: 10 IU/L (ref 0–32)
AST: 20 IU/L (ref 0–40)
Albumin/Globulin Ratio: 1.5 (ref 1.2–2.2)
Albumin: 4.1 g/dL (ref 3.8–4.8)
Alkaline Phosphatase: 67 IU/L (ref 44–121)
BUN/Creatinine Ratio: 14 (ref 9–23)
BUN: 12 mg/dL (ref 6–24)
Bilirubin Total: 0.3 mg/dL (ref 0.0–1.2)
CO2: 23 mmol/L (ref 20–29)
Calcium: 9.2 mg/dL (ref 8.7–10.2)
Chloride: 103 mmol/L (ref 96–106)
Creatinine, Ser: 0.86 mg/dL (ref 0.57–1.00)
Globulin, Total: 2.7 g/dL (ref 1.5–4.5)
Glucose: 97 mg/dL (ref 70–99)
Potassium: 4.6 mmol/L (ref 3.5–5.2)
Sodium: 138 mmol/L (ref 134–144)
Total Protein: 6.8 g/dL (ref 6.0–8.5)
eGFR: 84 mL/min/{1.73_m2} (ref >59)

## 2021-05-31 LAB — HEPATITIS C ANTIBODY: Hep C Virus Ab: <0.1 s/co ratio (ref 0.0–0.9)

## 2021-05-31 LAB — CBC
Hematocrit: 34.7 % (ref 34.0–46.6)
Hemoglobin: 11.5 g/dL (ref 11.1–15.9)
MCH: 30.3 pg (ref 26.6–33.0)
MCHC: 33.1 g/dL (ref 31.5–35.7)
MCV: 92 fL (ref 79–97)
Platelets: 276 10*3/uL (ref 150–450)
RBC: 3.79 x10E6/uL (ref 3.77–5.28)
RDW: 13.2 % (ref 11.7–15.4)
WBC: 5.2 10*3/uL (ref 3.4–10.8)

## 2021-05-31 LAB — VITAMIN D 25 HYDROXY (VIT D DEFICIENCY, FRACTURES): Vit D, 25-Hydroxy: 37.3 ng/mL (ref 30.0–100.0)

## 2021-07-23 LAB — HM MAMMOGRAPHY: HM Mammogram: NORMAL (ref 0–4)

## 2021-07-25 ENCOUNTER — Encounter: Payer: Self-pay | Admitting: Internal Medicine

## 2021-08-27 ENCOUNTER — Encounter: Payer: Self-pay | Admitting: Internal Medicine

## 2021-08-28 ENCOUNTER — Ambulatory Visit (INDEPENDENT_AMBULATORY_CARE_PROVIDER_SITE_OTHER): Payer: Federal, State, Local not specified - PPO | Admitting: Nurse Practitioner

## 2021-08-28 ENCOUNTER — Encounter: Payer: Self-pay | Admitting: Nurse Practitioner

## 2021-08-28 ENCOUNTER — Other Ambulatory Visit: Payer: Self-pay

## 2021-08-28 VITALS — BP 130/78 | HR 77 | Temp 98.1°F | Ht 64.2 in | Wt 222.4 lb

## 2021-08-28 DIAGNOSIS — R0981 Nasal congestion: Secondary | ICD-10-CM | POA: Diagnosis not present

## 2021-08-28 DIAGNOSIS — Z6837 Body mass index (BMI) 37.0-37.9, adult: Secondary | ICD-10-CM

## 2021-08-28 DIAGNOSIS — I10 Essential (primary) hypertension: Secondary | ICD-10-CM

## 2021-08-28 DIAGNOSIS — M5440 Lumbago with sciatica, unspecified side: Secondary | ICD-10-CM

## 2021-08-28 DIAGNOSIS — G8929 Other chronic pain: Secondary | ICD-10-CM

## 2021-08-28 DIAGNOSIS — Z0000 Encounter for general adult medical examination without abnormal findings: Secondary | ICD-10-CM | POA: Diagnosis not present

## 2021-08-28 DIAGNOSIS — E66812 Obesity, class 2: Secondary | ICD-10-CM

## 2021-08-28 DIAGNOSIS — Z13228 Encounter for screening for other metabolic disorders: Secondary | ICD-10-CM

## 2021-08-28 LAB — POCT URINALYSIS DIPSTICK
Blood, UA: NEGATIVE
Glucose, UA: NEGATIVE
Leukocytes, UA: NEGATIVE
Nitrite, UA: NEGATIVE
Protein, UA: NEGATIVE
Spec Grav, UA: >=1.03 — AB (ref 1.010–1.025)
Urobilinogen, UA: 0.2 E.U./dL
pH, UA: 6 (ref 5.0–8.0)

## 2021-08-28 LAB — POC INFLUENZA A&B (BINAX/QUICKVUE)
Influenza A, POC: NEGATIVE
Influenza B, POC: NEGATIVE

## 2021-08-28 LAB — POCT UA - MICROALBUMIN
Albumin/Creatinine Ratio, Urine, POC: <30
Creatinine, POC: 300 mg/dL
Microalbumin Ur, POC: 10 mg/L

## 2021-08-28 LAB — POC COVID19 BINAXNOW: SARS Coronavirus 2 Ag: NEGATIVE

## 2021-08-28 MED ORDER — AMOXICILLIN-POT CLAVULANATE 875-125 MG PO TABS: 1.0000 | ORAL_TABLET | Freq: Two times a day (BID) | ORAL | 0 refills | Status: AC

## 2021-08-28 NOTE — Progress Notes (Signed)
I,Tianna Badgett,acting as a Education administrator for [employer], NP.,have documented all relevant documentation on the behalf of [employer], NP,as directed by  Bary Castilla, NP while in the presence of Bary Castilla, NP.  This visit occurred during the SARS-CoV-2 public health emergency.  Safety protocols were in place, including screening questions prior to the visit, additional usage of staff PPE, and extensive cleaning of exam room while observing appropriate contact time as indicated for disinfecting solutions.  Subjective:     Patient ID: Maria Bryan , female    DOB: 1975-11-02 , 46 y.o.   MRN: [id number]  Chief Complaint  Patient presents with   Annual Exam   HPI  Patient is here for HM. She is having some more issues with the back pain. She would like to see ortho specialist and start PT again. She has a OBGYN in Dec. 2022 at [facility] and had a paps smear. She has had a mammogram in Dec. 2022. She has had a colonoscopy in 2021. She has covid and flu shots.  She was seeing a bariatric place before and would like to go there. She will make appt with them. She was working with a Automotive engineer.  Diet: She is working on her diet but would like to see dietician.  Exercise: She is trying but her back is hurting  She has a dentist and would like to get her eyes checked.      Past Medical History:  Diagnosis Date   Hypertension    Obesity      Family History  Problem Relation Age of Onset   Diabetes Mother    Hypertension Mother    Peripheral Artery Disease Mother    Hypertension Father    Prostate cancer Father    Hypertension Sister    Diabetes Maternal Aunt    Hypertension Maternal Grandmother    Diabetes Maternal Aunt    Lung cancer Maternal Aunt      Current Outpatient Medications:    acetaminophen (TYLENOL) 500 MG tablet, Take 1,000 mg by mouth daily as needed for moderate pain or headache., Disp: , Rfl:    amoxicillin-clavulanate (AUGMENTIN) 875-125  MG tablet, Take 1 tablet by mouth 2 (two) times daily for 7 days., Disp: 14 tablet, Rfl: 0   calcium carbonate (OS-CAL) 600 MG TABS tablet, Take by mouth., Disp: , Rfl:    cholecalciferol (VITAMIN D) 1000 units tablet, Take 2,000 Units by mouth daily., Disp: , Rfl:    Clindamycin-Benzoyl Per, Refr, gel, , Disp: , Rfl:    cyclobenzaprine (FLEXERIL) 10 MG tablet, Take 10 mg by mouth daily as needed for muscle spasms., Disp: , Rfl:    Dapsone 7.5 % GEL, Apply 1 application topically daily as needed (acne). , Disp: , Rfl:    Fluocinolone Acetonide Scalp 0.01 % OIL, Apply 1 application topically every 14 (fourteen) days., Disp: , Rfl:    fluocinonide ointment (LIDEX) 8.83 %, Apply 1 application topically every other day., Disp: , Rfl:    hydrocortisone 2.5 % cream, Apply 1 application topically every other day. , Disp: , Rfl:    labetalol (NORMODYNE) 200 MG tablet, TAKE 1 TABLET BY MOUTH TWICE A DAY, Disp: 180 tablet, Rfl: 1   magnesium oxide (MAG-OX) 400 MG tablet, Take 400 mg by mouth daily., Disp: , Rfl:    Multiple Vitamins-Minerals (OPURITY PO), Take 1 capsule by mouth., Disp: , Rfl:    PRESCRIPTION MEDICATION, Apply 1 application topically daily as needed (pain). diclofenac 3% baclofen 2%  lidocaine 5% Menthol 1%, Disp: , Rfl:    tretinoin (RETIN-A) 0.025 % cream, Apply 1 application topically at bedtime., Disp: , Rfl:    Biotin 87655 MCG TABS, Take 10,000 mcg by mouth daily. (Patient not taking: Reported on 08/28/2021), Disp: , Rfl:    Allergies  Allergen Reactions   Nsaids       The patient states she uses none for birth control. Last LMP was Patient's last menstrual period was 07/28/2021.. Negative for Dysmenorrhea. Negative for: breast discharge, breast lump(s), breast pain and breast self exam. Associated symptoms include abnormal vaginal bleeding. Pertinent negatives include abnormal bleeding (hematology), anxiety, decreased libido, depression, difficulty falling sleep, dyspareunia, history  of infertility, nocturia, sexual dysfunction, sleep disturbances, urinary incontinence, urinary urgency, vaginal discharge and vaginal itching. Diet regular.The patient states her exercise level is    . The patient's tobacco use is:  Social History   Tobacco Use  Smoking Status Never  Smokeless Tobacco Never  .She has been exposed to passive smoke. The patient's alcohol use is:  Social History   Substance and Sexual Activity  Alcohol Use Yes   Comment: ocasionally  Additional information: Last pap 07/2021, next one scheduled for 2025.    Review of Systems  Constitutional: Negative.  Negative for chills and fever.  HENT:  Positive for sinus pain and sore throat. Negative for congestion.   Eyes: Negative.   Respiratory: Negative.  Negative for cough, shortness of breath and wheezing.   Cardiovascular: Negative.  Negative for chest pain and palpitations.  Gastrointestinal: Negative.  Negative for constipation, diarrhea and vomiting.  Endocrine: Negative.  Negative for polydipsia, polyphagia and polyuria.  Genitourinary: Negative.   Musculoskeletal:  Positive for back pain. Negative for arthralgias and myalgias.  Skin: Negative.   Allergic/Immunologic: Negative.   Neurological: Negative.  Negative for weakness and headaches.  Hematological: Negative.   Psychiatric/Behavioral: Negative.      Today's Vitals   08/28/21 1022  BP: 130/78  Pulse: 77  Temp: 98.1 F (36.7 C)  TempSrc: Oral  Weight: 222 lb 6.4 oz (100.9 kg)  Height: 5' 4.2" (1.631 m)   Body mass index is 37.94 kg/m.  Wt Readings from Last 3 Encounters:  08/28/21 222 lb 6.4 oz (100.9 kg)  05/30/21 218 lb 6.4 oz (99.1 kg)  08/11/18 241 lb 6.4 oz (109.5 kg)    Objective:  Physical Exam Vitals and nursing note reviewed.  Constitutional:      Appearance: Normal appearance. She is obese.  HENT:     Head: Normocephalic and atraumatic.     Right Ear: Tympanic membrane, ear canal and external ear normal. There is no  impacted cerumen.     Left Ear: Tympanic membrane, ear canal and external ear normal. There is no impacted cerumen.     Nose:     Comments: Masked     Mouth/Throat:     Comments: Masked  Eyes:     Extraocular Movements: Extraocular movements intact.     Conjunctiva/sclera: Conjunctivae normal.     Pupils: Pupils are equal, round, and reactive to light.  Cardiovascular:     Rate and Rhythm: Normal rate and regular rhythm.     Pulses: Normal pulses.     Heart sounds: Normal heart sounds. No murmur heard. Pulmonary:     Effort: Pulmonary effort is normal. No respiratory distress.     Breath sounds: Normal breath sounds. No stridor. No wheezing.  Chest:  Breasts:    Tanner Score is 5.  Right: Normal.     Left: Normal.  Abdominal:     General: Abdomen is flat. Bowel sounds are normal.     Palpations: Abdomen is soft.  Genitourinary:    Comments: deferred Musculoskeletal:        General: Tenderness present. Normal range of motion.     Cervical back: Normal range of motion and neck supple.  Skin:    General: Skin is warm and dry.     Capillary Refill: Capillary refill takes less than 2 seconds.  Neurological:     General: No focal deficit present.     Mental Status: She is alert and oriented to person, place, and time.  Psychiatric:        Mood and Affect: Mood normal.        Behavior: Behavior normal.       Assessment And Plan:     1. Encounter for annual physical exam -Patient is here for their annual physical exam and we discussed any changes to medication and medical history.  -Behavior modification was discussed as well as diet and exercise history  -Patient will continue to exercise regularly and modify their diet.  -Recommendation for yearly physical annuals, immunization and screenings including mammogram and colonoscopy were discussed with the patient.  -Recommended intake of multivitamin, vitamin D and calcium.  -Individualized advise was given to the patient  pertaining to their own health history in regards to diet, exercise, medical condition and referrals.   2. Essential hypertension -Chronic, stable, continue meds  Limit the intake of processed foods and salt intake. You should increase your intake of green vegetables and fruits. Limit the use of alcohol. Limit fast foods and fried foods. Avoid high fatty saturated and trans fat foods. Keep yourself hydrated with drinking water. Avoid red meats. Eat lean meats instead. Exercise for atleast 30-45 min for atleast 4-5 times a week.  - POCT Urinalysis Dipstick (81002) - POCT UA - Microalbumin - EKG 12-Lead - CBC - CMP14+EGFR  3. Chronic midline low back pain with sciatica, sciatica laterality unspecified -Patient has had PT in the past and has seen the chiropractor. She has also been to [facility] in [facility] past.   - Ambulatory referral to Orthopedics - Ambulatory referral to Physical Therapy  4. Sinus congestion - amoxicillin-clavulanate (AUGMENTIN) 875-125 MG tablet; Take 1 tablet by mouth 2 (two) times daily for 7 days.  Dispense: 14 tablet; Refill: 0 - POC COVID-19 - POC Influenza A&B (Binax test)  5. Encounter for screening for metabolic disorder - Hemoglobin A1c - Lipid panel  6. Class 2 severe obesity due to excess calories with serious comorbidity and body mass index (BMI) of 37.0 to 37.9 in adult [facility] Advised patient on a healthy diet including avoiding fast food and red meats. Increase the intake of lean meats including grilled chicken and [place].  Drink a lot of water. Decrease intake of fatty foods. Exercise for 30-45 min. 4-5 a week to decrease the risk of cardiac event.   The patient was encouraged to call or send a message through [place] for any questions or concerns.   Follow up: if symptoms persist or do not get better.   Side effects and appropriate use of all the medication(s) were discussed with the patient today. Patient advised to use the medication(s) as directed by  their healthcare provider. The patient was encouraged to read, review, and understand all associated package inserts and contact our office with any questions or concerns. The patient accepts the  risks of the treatment plan and had an opportunity to ask questions.   Staying healthy and adopting a healthy lifestyle for your overall health is important. You should eat 7 or more servings of fruits and vegetables per day. You should drink plenty of water to keep yourself hydrated and your kidneys healthy. This includes about 65-80+ fluid ounces of water. Limit your intake of animal fats especially for elevated cholesterol. Avoid highly processed food and limit your salt intake if you have hypertension. Avoid foods high in saturated/Trans fats. Along with a healthy diet it is also very important to maintain time for yourself to maintain a healthy mental health with low stress levels. You should get atleast 150 min of moderate intensity exercise weekly for a healthy heart. Along with eating right and exercising, aim for at least 7-9 hours of sleep daily.  Eat more whole grains which includes barley, wheat berries, oats, brown rice and whole wheat pasta. Use healthy plant oils which include olive, soy, corn, sunflower and peanut. Limit your caffeine and sugary drinks. Limit your intake of fast foods. Limit milk and dairy products to one or two daily servings.   Patient was given opportunity to ask questions. Patient verbalized understanding of the plan and was able to repeat key elements of the plan. All questions were answered to their satisfaction.  Raman Shauntay Brunelli, DNP   I, Raman Ronique Simerly have reviewed all documentation for this visit. The documentation on 08/28/20 for the exam, diagnosis, procedures, and orders are all accurate and complete.   THE PATIENT IS ENCOURAGED TO PRACTICE SOCIAL DISTANCING DUE TO THE COVID-19 PANDEMIC.

## 2021-08-28 NOTE — Patient Instructions (Signed)

## 2021-08-29 LAB — HEMOGLOBIN A1C
Est. average glucose Bld gHb Est-mCnc: 108 mg/dL
Hgb A1c MFr Bld: 5.4 % (ref 4.8–5.6)

## 2021-08-29 LAB — CMP14+EGFR
ALT: 11 IU/L (ref 0–32)
AST: 21 IU/L (ref 0–40)
Albumin/Globulin Ratio: 1.5 (ref 1.2–2.2)
Albumin: 4.2 g/dL (ref 3.8–4.8)
Alkaline Phosphatase: 72 IU/L (ref 44–121)
BUN/Creatinine Ratio: 8 — ABNORMAL LOW (ref 9–23)
BUN: 8 mg/dL (ref 6–24)
Bilirubin Total: 0.3 mg/dL (ref 0.0–1.2)
CO2: 22 mmol/L (ref 20–29)
Calcium: 9.3 mg/dL (ref 8.7–10.2)
Chloride: 104 mmol/L (ref 96–106)
Creatinine, Ser: 1.02 mg/dL — ABNORMAL HIGH (ref 0.57–1.00)
Globulin, Total: 2.8 g/dL (ref 1.5–4.5)
Glucose: 90 mg/dL (ref 70–99)
Potassium: 4.4 mmol/L (ref 3.5–5.2)
Sodium: 138 mmol/L (ref 134–144)
Total Protein: 7 g/dL (ref 6.0–8.5)
eGFR: 68 mL/min/{1.73_m2} (ref >59)

## 2021-08-29 LAB — CBC
Hematocrit: 33.8 % — ABNORMAL LOW (ref 34.0–46.6)
Hemoglobin: 11.4 g/dL (ref 11.1–15.9)
MCH: 30.5 pg (ref 26.6–33.0)
MCHC: 33.7 g/dL (ref 31.5–35.7)
MCV: 90 fL (ref 79–97)
Platelets: 229 10*3/uL (ref 150–450)
RBC: 3.74 x10E6/uL — ABNORMAL LOW (ref 3.77–5.28)
RDW: 13.1 % (ref 11.7–15.4)
WBC: 3.9 10*3/uL (ref 3.4–10.8)

## 2021-08-29 LAB — LIPID PANEL
Chol/HDL Ratio: 2.7 ratio (ref 0.0–4.4)
Cholesterol, Total: 199 mg/dL (ref 100–199)
HDL: 74 mg/dL (ref >39)
LDL Chol Calc (NIH): 109 mg/dL — ABNORMAL HIGH (ref 0–99)
Triglycerides: 93 mg/dL (ref 0–149)
VLDL Cholesterol Cal: 16 mg/dL (ref 5–40)

## 2021-09-04 ENCOUNTER — Ambulatory Visit: Payer: Federal, State, Local not specified - PPO | Admitting: Orthopaedic Surgery

## 2021-09-04 ENCOUNTER — Other Ambulatory Visit: Payer: Self-pay

## 2021-09-04 ENCOUNTER — Encounter: Payer: Self-pay | Admitting: Orthopaedic Surgery

## 2021-09-04 ENCOUNTER — Ambulatory Visit: Payer: Self-pay

## 2021-09-04 VITALS — BP 134/89 | HR 85 | Ht 65.0 in | Wt 215.0 lb

## 2021-09-04 DIAGNOSIS — G8929 Other chronic pain: Secondary | ICD-10-CM | POA: Diagnosis not present

## 2021-09-04 DIAGNOSIS — M545 Low back pain, unspecified: Secondary | ICD-10-CM | POA: Diagnosis not present

## 2021-09-04 MED ORDER — PREDNISONE 5 MG (21) PO TBPK: ORAL_TABLET | ORAL | 0 refills | Status: DC

## 2021-09-04 NOTE — Progress Notes (Addendum)
Office Visit Note   Patient: Maria Bryan           Date of Birth: Apr 05, 1976           MRN: [id number] Visit Date: 09/04/2021              Requested by: Bary Castilla, NP [address] [place],  [place] [address] PCP: Glendale Chard, MD   Assessment & Plan: Visit Diagnoses:  1. Chronic bilateral low back pain, unspecified whether sciatica present     Plan: We will set patient up for some physical therapy for disc degeneration with left leg radicular symptoms.  Recheck 7 weeks.  She is not making improvement we will consider diagnostic imaging of the lumbar spine.  Follow-Up Instructions: No follow-ups on file.   Orders:  Orders Placed This Encounter  Procedures   XR Lumbar Spine 2-3 Views   Meds ordered this encounter  Medications   predniSONE (STERAPRED UNI-PAK 21 TAB) 5 MG (21) TBPK tablet    Sig: Take 6,5,4,3,2,1 one tablet less each day , take with food    Dispense:  21 tablet    Refill:  0      Procedures: No procedures performed   Clinical Data: No additional findings.   Subjective: Chief Complaint  Patient presents with   Lower Back - Pain    HPI 46 year old female has been in [place] in [place] return to [place] with problems with left shoulder SLAP tear as well as neck pain.  She has had physical therapy and aquatic therapy for low back pain.  She moved here in July.  States she has been worse in the last 3 weeks she is used the compound from [employer].  She has had 3 days of the Medrol Dosepak and is unable to take anti-inflammatories due to previous bariatric surgery.  Previous MRI showed SLAP tear.  Patient has back pain left leg pain which she states is overwhelming.  It radiates to her foot.  Now she is having lateral foot numbness.  She does well when she sitting she has increased pain when she is standing primarily left leg she has been treated with gabapentin but did not take the  medication.  She took few tablets of her husband's prednisone this past weekend.  Patient has facet arthropathy bottom 2 levels by plain radiograph.  She has 2 children age 53 and 73.  Previous MRI scan 09/18/[place]of[place]state report includes significant lower lumbar degenerative changes with disc desiccation 5 mm AP diameter canal at L4-5 severe right moderate left lateral recess narrowing.  Moderate foraminal encroachment.  L5-S1 disc bulge right greater than left extraforaminal disc extension.  Central canal without stenosis.  Moderate left and moderate to severe right foraminal encroachment.  Review of Systems past history of morbid obesity with weight loss BMI currently 35.  Negative bowel or bladder symptoms.   Objective: Vital Signs: BP 134/89    Pulse 85    Ht 5\' 5"  (1.651 m)    Wt 215 lb (97.5 kg)    BMI 35.78 kg/m   Physical Exam Constitutional:      Appearance: She is well-developed.  HENT:     Head: Normocephalic.     Right Ear: External ear normal.     Left Ear: External ear normal. There is no impacted cerumen.  Eyes:     Pupils: Pupils are equal, round, and reactive to light.  Neck:     Thyroid:  No thyromegaly.     Trachea: No tracheal deviation.  Cardiovascular:     Rate and Rhythm: Normal rate.  Pulmonary:     Effort: Pulmonary effort is normal.  Abdominal:     Palpations: Abdomen is soft.  Musculoskeletal:     Cervical back: No rigidity.  Skin:    General: Skin is warm and dry.  Neurological:     Mental Status: She is alert and oriented to person, place, and time.  Psychiatric:        Behavior: Behavior normal.    Ortho Exam patient has intact anterior tib gastrocsoleus.  She has some sciatic notch tenderness on the left.  Tenderness anterior left shoulder with palpation positive impingement test mild on the left negative on the right.  Good cervical range of motion.  Knee and ankle jerk are intact negative logroll the hips.  Specialty Comments:  No specialty  comments available.  Imaging: No results found.   PMFS History: Patient Active Problem List   Diagnosis Date Noted   OSA (obstructive sleep apnea) 06/15/2018   Chronic lumbosacral pain 06/15/2018   Essential hypertension 05/24/2018   Heart palpitations 01/06/2018   Snoring 01/06/2018   Morbid obesity ([place]) 01/06/2018   Past Medical History:  Diagnosis Date   Hypertension    Obesity     Family History  Problem Relation Age of Onset   Diabetes Mother    Hypertension Mother    Peripheral Artery Disease Mother    Hypertension Father    Prostate cancer Father    Hypertension Sister    Diabetes Maternal Aunt    Hypertension Maternal Grandmother    Diabetes Maternal Aunt    Lung cancer Maternal Aunt     Past Surgical History:  Procedure Laterality Date   ACHILLES TENDON REPAIR Left 2011   CESAREAN SECTION     LAPAROSCOPIC GASTRIC SLEEVE RESECTION N/A 06/15/2018   Procedure: LAPAROSCOPIC GASTRIC SLEEVE RESECTION, UPPER ENDO, ERAS PATHWAY;  Surgeon: Greer Pickerel, MD;  Location: [facility];  Service: General;  Laterality: N/A;   TUBAL LIGATION     Social History   Occupational History   Not on file  Tobacco Use   Smoking status: Never   Smokeless tobacco: Never  Vaping Use   Vaping Use: Never used  Substance and Sexual Activity   Alcohol use: Yes    Comment: ocasionally   Drug use: Not Currently   Sexual activity: Yes

## 2021-09-06 ENCOUNTER — Encounter: Payer: Federal, State, Local not specified - PPO | Admitting: Nurse Practitioner

## 2021-09-21 ENCOUNTER — Ambulatory Visit: Payer: Federal, State, Local not specified - PPO | Attending: Orthopaedic Surgery

## 2021-09-21 ENCOUNTER — Other Ambulatory Visit: Payer: Self-pay

## 2021-09-21 DIAGNOSIS — G8929 Other chronic pain: Secondary | ICD-10-CM | POA: Diagnosis present

## 2021-09-21 DIAGNOSIS — M545 Low back pain, unspecified: Secondary | ICD-10-CM | POA: Insufficient documentation

## 2021-09-21 DIAGNOSIS — M6281 Muscle weakness (generalized): Secondary | ICD-10-CM | POA: Insufficient documentation

## 2021-09-21 NOTE — Therapy (Signed)
OUTPATIENT PHYSICAL THERAPY THORACOLUMBAR EVALUATION   Patient Name: Shifra Pricer MRN: [id number] DOB:[birth year], 46 y.o., female Today's Date: 09/21/2021    Past Medical History:  Diagnosis Date   Hypertension    Obesity    Past Surgical History:  Procedure Laterality Date   ACHILLES TENDON REPAIR Left 2011   CESAREAN SECTION     LAPAROSCOPIC GASTRIC SLEEVE RESECTION N/A 06/15/2018   Procedure: LAPAROSCOPIC GASTRIC SLEEVE RESECTION, UPPER ENDO, ERAS PATHWAY;  Surgeon: Greer Pickerel, MD;  Location: [facility];  Service: General;  Laterality: N/A;   TUBAL LIGATION     Patient Active Problem List   Diagnosis Date Noted   OSA (obstructive sleep apnea) 06/15/2018   Chronic lumbosacral pain 06/15/2018   Essential hypertension 05/24/2018   Heart palpitations 01/06/2018   Snoring 01/06/2018   Morbid obesity ([place]) 01/06/2018    PCP: Glendale Chard, MD  REFERRING PROVIDER: Marybelle Killings, MD  REFERRING DIAG: M54.50,G89.29 (ICD-10-CM) - Chronic bilateral low back pain, unspecified whether sciatica present   THERAPY DIAG:  Chronic right-sided low back pain without sciatica  Muscle weakness (generalized)  ONSET DATE: 09/04/2021   SUBJECTIVE:                                                                                                                                                                                           SUBJECTIVE STATEMENT: Chronic low back pain with occasional radiating symptoms L>R, better with steroid dose pack, previous PT out of state helpful but has not been able to follow up or follow through PERTINENT HISTORY:  Previous MRI scan 09/18/2020 out-of-state report includes significant lower lumbar degenerative changes with disc desiccation 5 mm AP diameter canal at L4-5 severe right moderate left lateral recess narrowing.  Moderate foraminal encroachment.  L5-S1 disc bulge right greater than left extraforaminal disc extension.  Central canal without  stenosis.  Moderate left and moderate to severe right foraminal encroachment.   PAIN:  Are you having pain? Yes NPRS scale: 6/10 Pain location: L LS region Pain orientation: Left  PAIN TYPE: burning and tingling Pain description: intermittent and tingling  Aggravating factors: prolonged standing Relieving factors: sitting  PRECAUTIONS: None  WEIGHT BEARING RESTRICTIONS No  FALLS:  Has patient fallen in last 6 months? No, Number of falls: 0  LIVING ENVIRONMENT: Lives with: lives with their family Lives in: House/apartment  OCCUPATION: not working  PLOF: Independent  PATIENT GOALS: To lessen and manage my pain   OBJECTIVE:   DIAGNOSTIC FINDINGS:  Previous MRI scan 09/18/2020 out-of-state report includes significant lower lumbar degenerative changes with disc desiccation 5 mm AP diameter canal at L4-5 severe  right moderate left lateral recess narrowing.  Moderate foraminal encroachment.  L5-S1 disc bulge right greater than left extraforaminal disc extension.  Central canal without stenosis.  Moderate left and moderate to severe right foraminal encroachment.   PATIENT SURVEYS:  FOTO 40  SCREENING FOR RED FLAGS: Bowel or bladder incontinence: No   COGNITION:  Overall cognitive status: Within functional limits for tasks assessed     SENSATION:  Light touch: Appears intact   MUSCLE LENGTH: Hamstrings: Right 90 deg; Left 90 deg Thomas test: TBD  POSTURE:  Anterior pelvic tilt  PALPATION: TTP L SI joint  LUMBARAROM/PROM  A/PROM A/PROM  09/21/2021  Flexion 75%  Extension 75%  Right lateral flexion 75%  Left lateral flexion 75%  Right rotation   Left rotation    (Pain L LS region with extension and L SB)  LE AROM/PROM: (ROM WFL THROUGHOUT)  A/PROM Right 09/21/2021 Left 09/21/2021  Hip flexion    Hip extension    Hip abduction    Hip adduction    Hip internal rotation    Hip external rotation    Knee flexion    Knee extension    Ankle dorsiflexion     Ankle plantarflexion    Ankle inversion    Ankle eversion       LE MMT:  MMT Right 09/21/2021 Left 09/21/2021  Hip flexion 4 4  Hip extension 4 4  Hip abduction 4 4  Hip adduction    Hip internal rotation    Hip external rotation    Knee flexion 4 4  Knee extension 4 4  Ankle dorsiflexion    Ankle plantarflexion    Ankle inversion    Ankle eversion     Core strength 3+/5  LUMBAR SPECIAL TESTS:  Straight leg raise test: Negative, Slump test: Negative, and SI Compression/distraction test: Positive  FUNCTIONAL TESTS:  UTA due to time constraints  GAIT: Distance walked: 150 Assistive device utilized: None Level of assistance: Complete Independence  TODAY'S TREATMENT  Eval AND HEP   PATIENT EDUCATION:  Education details: Discussed eval findings, rehab rationale and POC and patient is in agreement  Person educated: Patient Education method: Explanation, Demonstration, and Handouts Education comprehension: verbalized understanding, returned demonstration, and needs further education   HOME EXERCISE PROGRAM: Access Code: [email or web address] URL: [email or web address] Date: 09/21/2021 Prepared by: Gustavus Bryant  Exercises Supine Figure 4 Piriformis Stretch - 2 x daily - 7 x weekly - 1 sets - 3 reps - 30s hold Supine Bridge - 2 x daily - 7 x weekly - 1 sets - 10-15 reps Curl Up with Arms Crossed - 2 x daily - 7 x weekly - 1 sets - 15 reps   ASSESSMENT:  CLINICAL IMPRESSION: Patient is a 46 y.o. female who was seen today for physical therapy evaluation and treatment for chronic low back and intermittent radicular symptoms.  LE ROM and flexibility is WNL, LE strength is functional but strength deficits noted in core.  Pain produced with L SI compressive forces but trunk mobility is functional.  Objective impairments include decreased endurance, decreased knowledge of condition, decreased mobility, difficulty walking, decreased ROM, decreased strength, increased  muscle spasms, and pain. These impairments are limiting patient from cleaning, meal prep, yard work, and exercising . Personal factors including Past/current experiences and Time since onset of injury/illness/exacerbation are also affecting patient's functional outcome. Patient will benefit from skilled PT to address above impairments and improve overall function.  REHAB POTENTIAL: Good  CLINICAL DECISION MAKING: Stable/uncomplicated  EVALUATION  COMPLEXITY: Low   GOALS: Goals reviewed with patient? Yes  SHORT TERM GOALS:  STG Name Target Date Goal status  1 Patient to demonstrate independence in HEP  Baseline: Access Code: [email or web address] 10/05/2021 INITIAL  2 Assess for SI dysfunction and set goal Baseline: TBD 10/05/2021 INITIAL  3 Perform 5x STS and set LTG Baseline: UTA due to time constraints 10/05/2021 INITIAL  LONG TERM GOALS:   LTG Name Target Date Goal status  1 Decrease pain to 2/10 Baseline: 6/10 10/19/2021 INITIAL  2 Increase core strength to 4/5 Baseline: 3+/5 10/19/2021 INITIAL  3 Improve FOTO score to 57 Baseline: FOTO 40 10/19/2021 INITIAL  4 Assess 5x STS score as needed Baseline:TBD 10/19/2021 INITIAL  5 Asses SI dysfunction if appropriate Baseline:TBD 10/19/2021 INITIAL  PLAN: PT FREQUENCY: 2x/week  PT DURATION: 4 weeks  PLANNED INTERVENTIONS: Therapeutic exercises, Therapeutic activity, Neuro Muscular re-education, Balance training, Gait training, Patient/Family education, Joint mobilization, Stair training, Dry Needling, and Manual therapy  PLAN FOR NEXT SESSION: core strength and stability, assess for SI joint dysfunction, hip flexors?   Lanice Shirts, PT 09/21/2021, 11:07 AM

## 2021-09-26 ENCOUNTER — Other Ambulatory Visit: Payer: Self-pay

## 2021-09-26 ENCOUNTER — Ambulatory Visit: Payer: Federal, State, Local not specified - PPO | Admitting: Orthopaedic Surgery

## 2021-09-26 ENCOUNTER — Encounter: Payer: Self-pay | Admitting: Orthopaedic Surgery

## 2021-09-26 ENCOUNTER — Ambulatory Visit: Payer: Federal, State, Local not specified - PPO

## 2021-09-26 DIAGNOSIS — M5136 Other intervertebral disc degeneration, lumbar region: Secondary | ICD-10-CM | POA: Diagnosis not present

## 2021-09-26 NOTE — Progress Notes (Signed)
Office Visit Note   Patient: Maria Bryan           Date of Birth: June 03, 1976           MRN: [id number] Visit Date: 09/26/2021              Requested by: Glendale Chard, [address] [place],  [place] [address] PCP: Glendale Chard, MD   Assessment & Plan: Visit Diagnoses:  1. Other intervertebral disc degeneration, lumbar region     Plan: We reviewed the report from [place] lumbar MRI January 2022.  At that time she had more protrusion extrusion on the right than the left where she is now symptomatic on the left.  She is functioning well we discussed workout activities to avoid and those that are less likely to bother her back.  If she has increased symptoms she will call and let us know.  Currently she is moving well can heel and toe walk and primarily is having left lateral foot numbness.  She develops increased radicular symptoms we can consider reimaging otherwise return as needed.  Follow-Up Instructions: Return if symptoms worsen or fail to improve.   Orders:  No orders of the defined types were placed in this encounter.  No orders of the defined types were placed in this encounter.     Procedures: No procedures performed   Clinical Data: No additional findings.   Subjective: Chief Complaint  Patient presents with   Left Leg - Pain    HPI 46 year old female returns with ongoing problems with leg pain.  She had disc degeneration both from MRI scan done in [place] January 2022 with disc degeneration dehydration protrusion worse on the right than the left at L4-5 and L5-S1.  She has symptoms that radiate down left leg none in her buttocks and thigh but just below the knee laterally to the lateral 2 toes lateral aspect of the foot.  Not particular pain mostly numbness.  She is using the elliptical trying to work out and work on weight loss.  BMI 35.  Review of Systems previous shoulder problems with SLAP lesion positive for hypertension all  other systems are noncontributory to HPI other than as mentioned above.   Objective: Vital Signs: BP (!) 161/98    Pulse 79    Ht 5\' 5"  (1.651 m)    Wt 215 lb (97.5 kg)    BMI 35.78 kg/m   Physical Exam Constitutional:      Appearance: She is well-developed.  HENT:     Head: Normocephalic.     Right Ear: External ear normal.     Left Ear: External ear normal. There is no impacted cerumen.  Eyes:     Pupils: Pupils are equal, round, and reactive to light.  Neck:     Thyroid: No thyromegaly.     Trachea: No tracheal deviation.  Cardiovascular:     Rate and Rhythm: Normal rate.  Pulmonary:     Effort: Pulmonary effort is normal.  Abdominal:     Palpations: Abdomen is soft.  Musculoskeletal:     Cervical back: No rigidity.  Skin:    General: Skin is warm and dry.  Neurological:     Mental Status: She is alert and oriented to person, place, and time.  Psychiatric:        Behavior: Behavior normal.    Ortho Exam patient is amatory she can heel and toe walk she has some pain with straight leg raising  9 degrees on the left pain with popliteal compression test.  Some discomfort straight leg raising on the right at 90 degrees as well.  No atrophy.  Negative [place].  Specialty Comments:  No specialty comments available.  Imaging: No results found.   PMFS History: Patient Active Problem List   Diagnosis Date Noted   Other intervertebral disc degeneration, lumbar region 09/26/2021   OSA (obstructive sleep apnea) 06/15/2018   Chronic lumbosacral pain 06/15/2018   Essential hypertension 05/24/2018   Heart palpitations 01/06/2018   Snoring 01/06/2018   Morbid obesity ([place]) 01/06/2018   Past Medical History:  Diagnosis Date   Hypertension    Obesity     Family History  Problem Relation Age of Onset   Diabetes Mother    Hypertension Mother    Peripheral Artery Disease Mother    Hypertension Father    Prostate cancer Father    Hypertension Sister    Diabetes Maternal  Aunt    Hypertension Maternal Grandmother    Diabetes Maternal Aunt    Lung cancer Maternal Aunt     Past Surgical History:  Procedure Laterality Date   ACHILLES TENDON REPAIR Left 2011   CESAREAN SECTION     LAPAROSCOPIC GASTRIC SLEEVE RESECTION N/A 06/15/2018   Procedure: LAPAROSCOPIC GASTRIC SLEEVE RESECTION, UPPER ENDO, ERAS PATHWAY;  Surgeon: Greer Pickerel, MD;  Location: [facility];  Service: General;  Laterality: N/A;   TUBAL LIGATION     Social History   Occupational History   Not on file  Tobacco Use   Smoking status: Never   Smokeless tobacco: Never  Vaping Use   Vaping Use: Never used  Substance and Sexual Activity   Alcohol use: Yes    Comment: ocasionally   Drug use: Not Currently   Sexual activity: Yes

## 2021-09-26 NOTE — Therapy (Deleted)
OUTPATIENT PHYSICAL THERAPY TREATMENT NOTE   Patient Name: Mack HookLaquetta D Jones Bigelow MRN: [id number] DOB:11/21/1975, 46 y.o., female Today's Date: 09/26/2021  PCP: Dorothyann PengSanders, Robyn, MD REFERRING PROVIDER: Eldred MangesYates, Mark C, MD    Past Medical History:  Diagnosis Date   Hypertension    Obesity    Past Surgical History:  Procedure Laterality Date   ACHILLES TENDON REPAIR Left 2011   CESAREAN SECTION     LAPAROSCOPIC GASTRIC SLEEVE RESECTION N/A 06/15/2018   Procedure: LAPAROSCOPIC GASTRIC SLEEVE RESECTION, UPPER ENDO, ERAS PATHWAY;  Surgeon: Gaynelle AduWilson, Eric, MD;  Location: [facility];  Service: General;  Laterality: N/A;   TUBAL LIGATION     Patient Active Problem List   Diagnosis Date Noted   Other intervertebral disc degeneration, lumbar region 09/26/2021   OSA (obstructive sleep apnea) 06/15/2018   Chronic lumbosacral pain 06/15/2018   Essential hypertension 05/24/2018   Heart palpitations 01/06/2018   Snoring 01/06/2018   Morbid obesity (HCC) 01/06/2018    REFERRING DIAG: M54.50,G89.29 (ICD-10-CM) - Chronic bilateral low back pain, unspecified whether sciatica present   THERAPY DIAG:  No diagnosis found.  PERTINENT HISTORY: HPI 46 year old female [place] problems with left shoulder SLAP tear as well as neck pain.  She has had physical therapy and aquatic therapy for low back pain.  She moved here in July.  States she has been worse in the last 3 weeks she is used the compound from [facility] also Flexeril.  She has had 3 days of the Medrol Dosepak and is unable to take anti-inflammatories due to previous bariatric surgery.  Previous MRI showed SLAP tear.  Patient has back pain left leg pain which she states is overwhelming.  It radiates to her foot.  Now she is having lateral foot numbness.  She does well when she sitting she has increased pain when she is standing primarily left leg she has been treated with gabapentin but  did not take the medication.  She took few tablets of her husband's prednisone this past weekend.  Patient has facet arthropathy bottom 2 levels by plain radiograph.  She has 2 children age 175 and 907.  PRECAUTIONS: none  SUBJECTIVE: ***  PAIN:  Are you having pain? {yes/no:20286} NPRS scale: ***/10 Pain location: *** Pain orientation: {Pain Orientation:25161}  PAIN TYPE: {type:313116} Pain description: {PAIN DESCRIPTION:21022940}  Aggravating factors: *** Relieving factors: ***     OBJECTIVE:    DIAGNOSTIC FINDINGS:  Previous MRI [place]18[place] includes significant lower lumbar degenerative changes with disc desiccation 5 mm AP diameter canal at L4-5 severe right moderate left lateral recess narrowing.  Moderate foraminal encroachment.  L5-S1 disc bulge right greater than left extraforaminal disc extension.  Central canal without stenosis.  Moderate left and moderate to severe right foraminal encroachment.    PATIENT SURVEYS:  FOTO 40   SCREENING FOR RED FLAGS: Bowel or bladder incontinence: No     COGNITION:          Overall cognitive status: Within functional limits for tasks assessed                        SENSATION:          Light touch: Appears intact           MUSCLE LENGTH: Hamstrings: Right 90 deg; Left 90 deg Thomas test: TBD   POSTURE:  Anterior pelvic tilt   PALPATION: TTP L SI joint  LUMBARAROM/PROM   A/PROM A/PROM  09/21/2021  Flexion 75%  Extension 75%  Right lateral flexion 75%  Left lateral flexion 75%  Right rotation    Left rotation     (Pain L LS region with extension and L SB)   LE AROM/PROM: (ROM WFL THROUGHOUT)   A/PROM Right 09/21/2021 Left 09/21/2021  Hip flexion      Hip extension      Hip abduction      Hip adduction      Hip internal rotation      Hip external rotation      Knee flexion      Knee extension      Ankle dorsiflexion      Ankle plantarflexion      Ankle inversion      Ankle eversion           LE MMT:   MMT Right 09/21/2021 Left 09/21/2021  Hip flexion 4 4  Hip extension 4 4  Hip abduction 4 4  Hip adduction      Hip internal rotation      Hip external rotation      Knee flexion 4 4  Knee extension 4 4  Ankle dorsiflexion      Ankle plantarflexion      Ankle inversion      Ankle eversion       Core strength 3+/5   LUMBAR SPECIAL TESTS:  Straight leg raise test: Negative, Slump test: Negative, and SI Compression/distraction test: Positive   FUNCTIONAL TESTS:  UTA due to time constraints   GAIT: Distance walked: 150 Assistive device utilized: None Level of assistance: Complete Independence   TODAY'S TREATMENT  Eval AND HEP     PATIENT EDUCATION:  Education details: Discussed eval findings, rehab rationale and POC and patient is in agreement  Person educated: Patient Education method: Explanation, Demonstration, and Handouts Education comprehension: verbalized understanding, returned demonstration, and needs further education     HOME EXERCISE PROGRAM: Access Code: [email or web address] URL: [email or web address] Date: 09/21/2021 Prepared by: Gustavus Bryant   Exercises Supine Figure 4 Piriformis Stretch - 2 x daily - 7 x weekly - 1 sets - 3 reps - 30s hold Supine Bridge - 2 x daily - 7 x weekly - 1 sets - 10-15 reps Curl Up with Arms Crossed - 2 x daily - 7 x weekly - 1 sets - 15 reps     ASSESSMENT:   CLINICAL IMPRESSION: Patient is a 46 y.o. female who was seen today for physical therapy evaluation and treatment for chronic low back and intermittent radicular symptoms.  LE ROM and flexibility is WNL, LE strength is functional but strength deficits noted in core.  Pain produced with L SI compressive forces but trunk mobility is functional.  Objective impairments include decreased endurance, decreased knowledge of condition, decreased mobility, difficulty walking, decreased ROM, decreased strength, increased muscle spasms, and pain. These  impairments are limiting patient from cleaning, meal prep, yard work, and exercising . Personal factors including Past/current experiences and Time since onset of injury/illness/exacerbation are also affecting patient's functional outcome. Patient will benefit from skilled PT to address above impairments and improve overall function.   REHAB POTENTIAL: Good   CLINICAL DECISION MAKING: Stable/uncomplicated   EVALUATION COMPLEXITY: Low     GOALS: Goals reviewed with patient? Yes   SHORT TERM GOALS:   STG Name Target Date Goal status  1 Patient to demonstrate independence in HEP  Baseline: Access Code: [email or web address] 10/05/2021 INITIAL  2 Assess  for SI dysfunction and set goal Baseline: TBD 10/05/2021 INITIAL  3 Perform 5x STS and set LTG Baseline: UTA due to time constraints 10/05/2021 INITIAL  LONG TERM GOALS:    LTG Name Target Date Goal status  1 Decrease pain to 2/10 Baseline: 6/10 10/19/2021 INITIAL  2 Increase core strength to 4/5 Baseline: 3+/5 10/19/2021 INITIAL  3 Improve FOTO score to 57 Baseline: FOTO 40 10/19/2021 INITIAL  4 Assess 5x STS score as needed Baseline:TBD 10/19/2021 INITIAL  5 Asses SI dysfunction if appropriate Baseline:TBD 10/19/2021 INITIAL  PLAN: PT FREQUENCY: 2x/week   PT DURATION: 4 weeks   PLANNED INTERVENTIONS: Therapeutic exercises, Therapeutic activity, Neuro Muscular re-education, Balance training, Gait training, Patient/Family education, Joint mobilization, Stair training, Dry Needling, and Manual therapy   PLAN FOR NEXT SESSION: core strength and stability, assess for SI joint dysfunction, hip flexors?      Hildred Laser, PT 09/26/2021, 4:35 PM

## 2021-09-28 ENCOUNTER — Telehealth: Payer: Self-pay

## 2021-09-28 ENCOUNTER — Ambulatory Visit: Payer: Federal, State, Local not specified - PPO | Attending: Orthopaedic Surgery

## 2021-09-28 NOTE — Telephone Encounter (Signed)
TC to patient regarding missed appointment.  Patient answered phone but did not respond to therapist.  Return call went unanswered.

## 2021-09-28 NOTE — Therapy (Deleted)
OUTPATIENT PHYSICAL THERAPY TREATMENT NOTE   Patient Name: Maria Bryan MRN: [id number] DOB:1976-06-12, 46 y.o., female Today's Date: 09/28/2021  PCP: Dorothyann Peng, MD REFERRING PROVIDER: Dorothyann Peng, MD    Past Medical History:  Diagnosis Date   Hypertension    Obesity    Past Surgical History:  Procedure Laterality Date   ACHILLES TENDON REPAIR Left 2011   CESAREAN SECTION     LAPAROSCOPIC GASTRIC SLEEVE RESECTION N/A 06/15/2018   Procedure: LAPAROSCOPIC GASTRIC SLEEVE RESECTION, UPPER ENDO, ERAS PATHWAY;  Surgeon: Gaynelle Adu, MD;  Location: [facility];  Service: General;  Laterality: N/A;   TUBAL LIGATION     Patient Active Problem List   Diagnosis Date Noted   Other intervertebral disc degeneration, lumbar region 09/26/2021   OSA (obstructive sleep apnea) 06/15/2018   Chronic lumbosacral pain 06/15/2018   Essential hypertension 05/24/2018   Heart palpitations 01/06/2018   Snoring 01/06/2018   Morbid obesity (HCC) 01/06/2018    REFERRING DIAG: M54.50,G89.29 (ICD-10-CM) - Chronic bilateral low back pain, unspecified whether sciatica present   THERAPY DIAG:  No diagnosis found.  PERTINENT HISTORY: HPI 46 year old female returns with ongoing problems with leg pain.  She had disc degeneration both from MRI scan done in [place] January 2022 with disc degeneration dehydration protrusion worse on the right than the left at L4-5 and L5-S1.  She has symptoms that radiate down left leg none in her buttocks and thigh but just below the knee laterally to the lateral 2 toes lateral aspect of the foot.  Not particular pain mostly numbness.  She is using the elliptical trying to work out and work on weight loss.  BMI 35.   Review of Systems previous shoulder problems with SLAP lesion positive for hypertension all other systems are noncontributory to HPI other than as mentioned above.    PRECAUTIONS: none  SUBJECTIVE: ***  PAIN:  Are you having pain?  {yes/no:20286} NPRS scale: ***/10 Pain location: *** Pain orientation: {Pain Orientation:25161}  PAIN TYPE: {type:313116} Pain description: {PAIN DESCRIPTION:21022940}  Aggravating factors: *** Relieving factors: ***       OBJECTIVE:    DIAGNOSTIC FINDINGS:  Previous MRI scan 09/18/2020 out-of-state report includes significant lower lumbar degenerative changes with disc desiccation 5 mm AP diameter canal at L4-5 severe right moderate left lateral recess narrowing.  Moderate foraminal encroachment.  L5-S1 disc bulge right greater than left extraforaminal disc extension.  Central canal without stenosis.  Moderate left and moderate to severe right foraminal encroachment.    PATIENT SURVEYS:  FOTO 40   SCREENING FOR RED FLAGS: Bowel or bladder incontinence: No     COGNITION:          Overall cognitive status: Within functional limits for tasks assessed                        SENSATION:          Light touch: Appears intact           MUSCLE LENGTH: Hamstrings: Right 90 deg; Left 90 deg Thomas test: TBD   POSTURE:  Anterior pelvic tilt   PALPATION: TTP L SI joint   LUMBARAROM/PROM   A/PROM A/PROM  09/21/2021  Flexion 75%  Extension 75%  Right lateral flexion 75%  Left lateral flexion 75%  Right rotation    Left rotation     (Pain L LS region with extension and L SB)   LE AROM/PROM: (ROM WFL THROUGHOUT)   A/PROM Right 09/21/2021  Left 09/21/2021  Hip flexion      Hip extension      Hip abduction      Hip adduction      Hip internal rotation      Hip external rotation      Knee flexion      Knee extension      Ankle dorsiflexion      Ankle plantarflexion      Ankle inversion      Ankle eversion          LE MMT:   MMT Right 09/21/2021 Left 09/21/2021  Hip flexion 4 4  Hip extension 4 4  Hip abduction 4 4  Hip adduction      Hip internal rotation      Hip external rotation      Knee flexion 4 4  Knee extension 4 4  Ankle dorsiflexion      Ankle  plantarflexion      Ankle inversion      Ankle eversion       Core strength 3+/5   LUMBAR SPECIAL TESTS:  Straight leg raise test: Negative, Slump test: Negative, and SI Compression/distraction test: Positive   FUNCTIONAL TESTS:  UTA due to time constraints   GAIT: Distance walked: 150 Assistive device utilized: None Level of assistance: Complete Independence   TODAY'S TREATMENT  Eval AND HEP     PATIENT EDUCATION:  Education details: Discussed eval findings, rehab rationale and POC and patient is in agreement  Person educated: Patient Education method: Explanation, Demonstration, and Handouts Education comprehension: verbalized understanding, returned demonstration, and needs further education     HOME EXERCISE PROGRAM: Access Code: [email or web address] URL: [email or web address] Date: 09/21/2021 Prepared by: Gustavus BryantJeffrey Tyera Hansley   Exercises Supine Figure 4 Piriformis Stretch - 2 x daily - 7 x weekly - 1 sets - 3 reps - 30s hold Supine Bridge - 2 x daily - 7 x weekly - 1 sets - 10-15 reps Curl Up with Arms Crossed - 2 x daily - 7 x weekly - 1 sets - 15 reps     ASSESSMENT:   CLINICAL IMPRESSION: Patient is a 46 y.o. female who was seen today for physical therapy evaluation and treatment for chronic low back and intermittent radicular symptoms.  LE ROM and flexibility is WNL, LE strength is functional but strength deficits noted in core.  Pain produced with L SI compressive forces but trunk mobility is functional.  Objective impairments include decreased endurance, decreased knowledge of condition, decreased mobility, difficulty walking, decreased ROM, decreased strength, increased muscle spasms, and pain. These impairments are limiting patient from cleaning, meal prep, yard work, and exercising . Personal factors including Past/current experiences and Time since onset of injury/illness/exacerbation are also affecting patient's functional outcome. Patient will benefit from  skilled PT to address above impairments and improve overall function.   REHAB POTENTIAL: Good   CLINICAL DECISION MAKING: Stable/uncomplicated   EVALUATION COMPLEXITY: Low     GOALS: Goals reviewed with patient? Yes   SHORT TERM GOALS:   STG Name Target Date Goal status  1 Patient to demonstrate independence in HEP  Baseline: Access Code: [email or web address] 10/05/2021 INITIAL  2 Assess for SI dysfunction and set goal Baseline: TBD 10/05/2021 INITIAL  3 Perform 5x STS and set LTG Baseline: UTA due to time constraints 10/05/2021 INITIAL  LONG TERM GOALS:    LTG Name Target Date Goal status  1 Decrease pain to 2/10 Baseline: 6/10 10/19/2021 INITIAL  2 Increase core strength to 4/5  Baseline: 3+/5 10/19/2021 INITIAL  3 Improve FOTO score to 57 Baseline: FOTO 40 10/19/2021 INITIAL  4 Assess 5x STS score as needed Baseline:TBD 10/19/2021 INITIAL  5 Asses SI dysfunction if appropriate Baseline:TBD 10/19/2021 INITIAL  PLAN: PT FREQUENCY: 2x/week   PT DURATION: 4 weeks   PLANNED INTERVENTIONS: Therapeutic exercises, Therapeutic activity, Neuro Muscular re-education, Balance training, Gait training, Patient/Family education, Joint mobilization, Stair training, Dry Needling, and Manual therapy   PLAN FOR NEXT SESSION: core strength and stability, assess for SI joint dysfunction, hip flexors?    Hildred Laser, PT 09/28/2021, 8:12 AM

## 2021-10-01 ENCOUNTER — Ambulatory Visit: Payer: Federal, State, Local not specified - PPO

## 2021-10-03 ENCOUNTER — Ambulatory Visit: Payer: Federal, State, Local not specified - PPO

## 2021-10-08 ENCOUNTER — Ambulatory Visit: Payer: Federal, State, Local not specified - PPO

## 2021-10-12 ENCOUNTER — Ambulatory Visit: Payer: Federal, State, Local not specified - PPO

## 2021-10-19 ENCOUNTER — Ambulatory Visit: Payer: Federal, State, Local not specified - PPO | Admitting: Orthopaedic Surgery

## 2021-10-19 ENCOUNTER — Ambulatory Visit: Payer: Federal, State, Local not specified - PPO

## 2021-10-23 ENCOUNTER — Ambulatory Visit: Payer: Federal, State, Local not specified - PPO | Admitting: Orthopaedic Surgery

## 2022-03-18 ENCOUNTER — Encounter: Payer: Self-pay | Admitting: Orthopaedic Surgery

## 2022-03-19 ENCOUNTER — Other Ambulatory Visit: Payer: Self-pay

## 2022-03-19 DIAGNOSIS — M545 Low back pain, unspecified: Secondary | ICD-10-CM

## 2022-03-19 DIAGNOSIS — M5136 Other intervertebral disc degeneration, lumbar region: Secondary | ICD-10-CM

## 2022-04-17 ENCOUNTER — Other Ambulatory Visit: Payer: Self-pay

## 2022-04-17 MED ORDER — AMLODIPINE BESYLATE 10 MG PO TABS: ORAL_TABLET | ORAL | 1 refills | Status: DC

## 2022-04-17 MED ORDER — LABETALOL HCL 200 MG PO TABS: 200.0000 mg | ORAL_TABLET | Freq: Two times a day (BID) | ORAL | 1 refills | Status: DC

## 2022-04-29 DIAGNOSIS — M25572 Pain in left ankle and joints of left foot: Secondary | ICD-10-CM | POA: Diagnosis not present

## 2022-04-29 DIAGNOSIS — M1712 Unilateral primary osteoarthritis, left knee: Secondary | ICD-10-CM | POA: Diagnosis not present

## 2022-04-30 ENCOUNTER — Encounter: Payer: Self-pay | Admitting: Nurse Practitioner

## 2022-04-30 ENCOUNTER — Other Ambulatory Visit: Payer: Self-pay

## 2022-04-30 ENCOUNTER — Ambulatory Visit: Payer: Federal, State, Local not specified - PPO | Admitting: Nurse Practitioner

## 2022-04-30 VITALS — BP 144/80 | HR 93 | Temp 98.1°F | Ht 65.0 in | Wt 229.8 lb

## 2022-04-30 DIAGNOSIS — G8929 Other chronic pain: Secondary | ICD-10-CM | POA: Diagnosis not present

## 2022-04-30 DIAGNOSIS — M25562 Pain in left knee: Secondary | ICD-10-CM

## 2022-04-30 DIAGNOSIS — M5440 Lumbago with sciatica, unspecified side: Secondary | ICD-10-CM | POA: Diagnosis not present

## 2022-04-30 DIAGNOSIS — I10 Essential (primary) hypertension: Secondary | ICD-10-CM

## 2022-04-30 DIAGNOSIS — Z23 Encounter for immunization: Secondary | ICD-10-CM

## 2022-04-30 DIAGNOSIS — Z6837 Body mass index (BMI) 37.0-37.9, adult: Secondary | ICD-10-CM | POA: Diagnosis not present

## 2022-04-30 DIAGNOSIS — Z903 Acquired absence of stomach [part of]: Secondary | ICD-10-CM

## 2022-04-30 DIAGNOSIS — E559 Vitamin D deficiency, unspecified: Secondary | ICD-10-CM

## 2022-04-30 NOTE — Patient Instructions (Addendum)
Hypertension, Adult Hypertension is another name for high blood pressure. High blood pressure forces your heart to work harder to pump blood. This can cause problems over time. There are two numbers in a blood pressure reading. There is a top number (systolic) over a bottom number (diastolic). It is best to have a blood pressure that is below 120/80. What are the causes? The cause of this condition is not known. Some other conditions can lead to high blood pressure. What increases the risk? Some lifestyle factors can make you more likely to develop high blood pressure: Smoking. Not getting enough exercise or physical activity. Being overweight. Having too much fat, sugar, calories, or salt (sodium) in your diet. Drinking too much alcohol. Other risk factors include: Having any of these conditions: Heart disease. Diabetes. High cholesterol. Kidney disease. Obstructive sleep apnea. Having a family history of high blood pressure and high cholesterol. Age. The risk increases with age. Stress. What are the signs or symptoms? High blood pressure may not cause symptoms. Very high blood pressure (hypertensive crisis) may cause: Headache. Fast or uneven heartbeats (palpitations). Shortness of breath. Nosebleed. Vomiting or feeling like you may vomit (nauseous). Changes in how you see. Very bad chest pain. Feeling dizzy. Seizures. How is this treated? This condition is treated by making healthy lifestyle changes, such as: Eating healthy foods. Exercising more. Drinking less alcohol. Your doctor may prescribe medicine if lifestyle changes do not help enough and if: Your top number is above 130. Your bottom number is above 80. Your personal target blood pressure may vary. Follow these instructions at home: Eating and drinking  If told, follow the DASH eating plan. To follow this plan: Fill one half of your plate at each meal with fruits and vegetables. Fill one fourth of your plate  at each meal with whole grains. Whole grains include whole-wheat pasta, brown rice, and whole-grain bread. Eat or drink low-fat dairy products, such as skim milk or low-fat yogurt. Fill one fourth of your plate at each meal with low-fat (lean) proteins. Low-fat proteins include fish, chicken without skin, eggs, beans, and tofu. Avoid fatty meat, cured and processed meat, or chicken with skin. Avoid pre-made or processed food. Limit the amount of salt in your diet to less than 1,500 mg each day. Do not drink alcohol if: Your doctor tells you not to drink. You are pregnant, may be pregnant, or are planning to become pregnant. If you drink alcohol: Limit how much you have to: 0-1 drink a day for women. 0-2 drinks a day for men. Know how much alcohol is in your drink. In the [place] one drink equals one 12 oz bottle of beer (355 mL), one 5 oz glass of wine (148 mL), or one 1 oz glass of hard liquor (44 mL). Lifestyle  Work with your doctor to stay at a healthy weight or to lose weight. Ask your doctor what the best weight is for you. Get at least 30 minutes of exercise that causes your heart to beat faster (aerobic exercise) most days of the week. This may include walking, swimming, or biking. Get at least 30 minutes of exercise that strengthens your muscles (resistance exercise) at least 3 days a week. This may include lifting weights or doing Pilates. Do not smoke or use any products that contain nicotine or tobacco. If you need help quitting, ask your doctor. Check your blood pressure at home as told by your doctor. Keep all follow-up visits. Medicines Take over-the-counter and prescription medicines  only as told by your doctor. Follow directions carefully. Do not skip doses of blood pressure medicine. The medicine does not work as well if you skip doses. Skipping doses also puts you at risk for problems. Ask your doctor about side effects or reactions to medicines that you should watch  for. Contact a doctor if: You think you are having a reaction to the medicine you are taking. You have headaches that keep coming back. You feel dizzy. You have swelling in your ankles. You have trouble with your vision. Get help right away if: You get a very bad headache. You start to feel mixed up (confused). You feel weak or numb. You feel faint. You have very bad pain in your: Chest. Belly (abdomen). You vomit more than once. You have trouble breathing. These symptoms may be an emergency. Get help right away. Call 911. Do not wait to see if the symptoms will go away. Do not drive yourself to the hospital. Summary Hypertension is another name for high blood pressure. High blood pressure forces your heart to work harder to pump blood. For most people, a normal blood pressure is less than 120/80. Making healthy choices can help lower blood pressure. If your blood pressure does not get lower with healthy choices, you may need to take medicine. This information is not intended to replace advice given to you by your health care provider. Make sure you discuss any questions you have with your health care provider. Document Revised: 05/31/2021 Document Reviewed: 05/31/2021 Elsevier Patient Education  2023 [employer]   Take amlodipine daily and labetolol two times a day through your menstrual cycle then after cycle take one tab of labetolol and amlodipine come for nurse visit on October 3rd then follow up with me in 4 weeks.

## 2022-04-30 NOTE — Progress Notes (Signed)
I,Victoria T Hamilton,acting as a Education administrator for Minette Brine, FNP.,have documented all relevant documentation on the behalf of Minette Brine, FNP,as directed by  Minette Brine, FNP while in the presence of Minette Brine, [place].    Subjective:     Patient ID: Maria Bryan , female    DOB: 03-Aug-1976 , 46 y.o.   MRN: [id number]   Chief Complaint  Patient presents with   Hypertension    HPI  Patient presents today for a bp check, Patient states she is having issues with her left knee. She had a steroid injection to the left knee. Patient needs her "prescription medication" refilled for her pain cream for her joint pain.   She had bariatric surgery in 2019 with Dr. Redmond Pulling. She would like to re-engage with losing weight. She is trying to get a letter of necessity.  She would like to get a smart watch, she is interesting in swimming lessons.  She is with a [facility] for ortho, she was referred for Aquatic PT at [facility].   Her lowest weight was 210 lbs, highest was 230 lbs. She noticed it being more busy with her family here. She has been traveling for the last 1.5 months.    Feels like her blood pressure has been going up for a week or two with labetolol, during the week of her menstrual cycle. Has been on and off of the amlodipine. She did take her amlodipine this morning. She does admit to eating poorly over the weekend.   Hypertension This is a chronic problem. The current episode started more than 1 year ago. The problem is uncontrolled. Pertinent negatives include no chest pain, headaches, palpitations or shortness of breath. Risk factors for coronary artery disease include obesity and sedentary lifestyle. Past treatments include beta blockers.     Past Medical History:  Diagnosis Date   Hypertension    Obesity      Family History  Problem Relation Age of Onset   Diabetes Mother    Hypertension Mother    Peripheral Artery Disease Mother    Hypertension Father    Prostate  cancer Father    Hypertension Sister    Diabetes Maternal Aunt    Hypertension Maternal Grandmother    Diabetes Maternal Aunt    Lung cancer Maternal Aunt      Current Outpatient Medications:    acetaminophen (TYLENOL) 500 MG tablet, Take 1,000 mg by mouth daily as needed for moderate pain or headache., Disp: , Rfl:    amLODipine (NORVASC) 10 MG tablet, TAKE 1 TABLET DAILY., Disp: 30 tablet, Rfl: 1   calcium carbonate (OS-CAL) 600 MG TABS tablet, Take by mouth., Disp: , Rfl:    cholecalciferol (VITAMIN D) 1000 units tablet, Take 2,000 Units by mouth daily., Disp: , Rfl:    Clindamycin-Benzoyl Per, Refr, gel, , Disp: , Rfl:    Dapsone 7.5 % GEL, Apply 1 application topically daily as needed (acne). , Disp: , Rfl:    Fluocinolone Acetonide Scalp 0.01 % OIL, Apply 1 application topically every 14 (fourteen) days., Disp: , Rfl:    fluocinonide ointment (LIDEX) 5.73 %, Apply 1 application topically every other day., Disp: , Rfl:    hydrocortisone 2.5 % cream, Apply 1 application topically every other day. , Disp: , Rfl:    labetalol (NORMODYNE) 200 MG tablet, Take 1 tablet (200 mg total) by mouth 2 (two) times daily., Disp: 30 tablet, Rfl: 1   magnesium oxide (MAG-OX) 400 MG tablet, Take 400  mg by mouth daily., Disp: , Rfl:    Multiple Vitamins-Minerals (OPURITY PO), Take 1 capsule by mouth., Disp: , Rfl:    PRESCRIPTION MEDICATION, Apply 1 application topically daily as needed (pain). diclofenac 3% baclofen 2% lidocaine 5% Menthol 1%, Disp: , Rfl:    tretinoin (RETIN-A) 0.025 % cream, Apply 1 application topically at bedtime., Disp: , Rfl:    Biotin 10000 MCG TABS, Take 10,000 mcg by mouth daily. (Patient not taking: Reported on 08/28/2021), Disp: , Rfl:    cyclobenzaprine (FLEXERIL) 10 MG tablet, Take 10 mg by mouth daily as needed for muscle spasms. (Patient not taking: Reported on 04/30/2022), Disp: , Rfl:    Allergies  Allergen Reactions   Nsaids      Review of Systems  Constitutional:  Negative.   Respiratory: Negative.  Negative for shortness of breath.   Cardiovascular: Negative.  Negative for chest pain, palpitations and leg swelling.  Neurological: Negative.  Negative for headaches.  Psychiatric/Behavioral: Negative.       Today's Vitals   04/30/22 0854 04/30/22 1034  BP: (!) 140/82 (!) 144/80  Pulse: 93   Temp: 98.1 F (36.7 C)   TempSrc: Oral   Weight: 229 lb 12.8 oz (104.2 kg)   Height: $Remove'5\' 5"'mRZVTbH$  (1.651 m)   PainSc: 5    PainLoc: Knee    Body mass index is 38.24 kg/m.   Objective:  Physical Exam Vitals reviewed.  Constitutional:      General: She is not in acute distress.    Appearance: Normal appearance. She is well-developed. She is obese.  HENT:     Head: Normocephalic and atraumatic.  Eyes:     Pupils: Pupils are equal, round, and reactive to light.  Cardiovascular:     Rate and Rhythm: Normal rate and regular rhythm.     Pulses: Normal pulses.     Heart sounds: Normal heart sounds. No murmur heard. Pulmonary:     Effort: Pulmonary effort is normal. No respiratory distress.     Breath sounds: Normal breath sounds. No wheezing.  Musculoskeletal:        General: Tenderness (left knee) present.  Skin:    General: Skin is warm and dry.     Capillary Refill: Capillary refill takes less than 2 seconds.  Neurological:     General: No focal deficit present.     Mental Status: She is alert and oriented to person, place, and time.     Cranial Nerves: No cranial nerve deficit.  Psychiatric:        Mood and Affect: Mood normal.        Behavior: Behavior normal.        Thought Content: Thought content normal.        Judgment: Judgment normal.         Assessment And Plan:     1. Essential hypertension She has not been taking her medications consistently in the sense of holding one BP med and then take a different one with her menstruall cycle. Take amlodipine daily and labetolol two times a day through your menstrual cycle then after cycle take  one tab of labetolol and amlodipine come for nurse visit on October 3rd then follow up with me in 4 weeks.   2. Chronic midline low back pain with sciatica, sciatica laterality unspecified Comments: She is hoping this willl improve as she begins to move more.  3. Acute pain of left knee Comments: Continue treatment plan with Ortho to include PT  4.  Vitamin D deficiency Will check vitamin D level and supplement as needed.    Also encouraged to spend 15 minutes in the sun daily.   5. Class 2 severe obesity due to excess calories with serious comorbidity and body mass index (BMI) of 37.0 to 37.9 in adult [facility]) Comments: She is ready to improve her health and increase her activity. Will refer to [facility] to help with her diet. Will provide a letter for her to purchase a smart watch and swimming lessons/class through her insurance. This will increase her chance for success with weight loss.  - Amb Ref to Medical Weight Management - Hemoglobin A1c - BMP8+eGFR - TSH  6. H/O gastric sleeve - Amb Ref to Medical Weight Management  7. Encounter for immunization Influenza vaccine administered Encouraged to take Tylenol as needed for fever or muscle aches. - Flu Vaccine QUAD 6+ mos PF IM (Fluarix Quad PF)     Patient was given opportunity to ask questions. Patient verbalized understanding of the plan and was able to repeat key elements of the plan. All questions were answered to their satisfaction.  Minette Brine, FNP   I, Minette Brine, FNP, have reviewed all documentation for this visit. The documentation on 04/30/22 for the exam, diagnosis, procedures, and orders are all accurate and complete.   IF YOU HAVE BEEN REFERRED TO A SPECIALIST, IT MAY TAKE 1-2 WEEKS TO SCHEDULE/PROCESS THE REFERRAL. IF YOU HAVE NOT HEARD FROM US/SPECIALIST IN TWO WEEKS, PLEASE GIVE [place] A CALL AT [phone].   THE PATIENT IS ENCOURAGED TO PRACTICE SOCIAL DISTANCING DUE TO THE COVID-19  PANDEMIC.

## 2022-05-01 LAB — BMP8+EGFR
BUN/Creatinine Ratio: 17 (ref 9–23)
BUN: 14 mg/dL (ref 6–24)
CO2: 21 mmol/L (ref 20–29)
Calcium: 9.9 mg/dL (ref 8.7–10.2)
Chloride: 102 mmol/L (ref 96–106)
Creatinine, Ser: 0.84 mg/dL (ref 0.57–1.00)
Glucose: 110 mg/dL — ABNORMAL HIGH (ref 70–99)
Potassium: 4 mmol/L (ref 3.5–5.2)
Sodium: 138 mmol/L (ref 134–144)
eGFR: 86 mL/min/{1.73_m2} (ref >59)

## 2022-05-01 LAB — HEMOGLOBIN A1C
Est. average glucose Bld gHb Est-mCnc: 105 mg/dL
Hgb A1c MFr Bld: 5.3 % (ref 4.8–5.6)

## 2022-05-01 LAB — TSH: TSH: 1.1 u[IU]/mL (ref 0.450–4.500)

## 2022-05-05 ENCOUNTER — Other Ambulatory Visit: Payer: Self-pay | Admitting: Nurse Practitioner

## 2022-05-11 ENCOUNTER — Encounter: Payer: Self-pay | Admitting: Nurse Practitioner

## 2022-05-15 ENCOUNTER — Ambulatory Visit (HOSPITAL_BASED_OUTPATIENT_CLINIC_OR_DEPARTMENT_OTHER): Payer: Federal, State, Local not specified - PPO | Attending: Orthopaedic Surgery | Admitting: Physical Therapy

## 2022-05-15 ENCOUNTER — Encounter (HOSPITAL_BASED_OUTPATIENT_CLINIC_OR_DEPARTMENT_OTHER): Payer: Self-pay | Admitting: Physical Therapy

## 2022-05-15 DIAGNOSIS — M545 Low back pain, unspecified: Secondary | ICD-10-CM | POA: Insufficient documentation

## 2022-05-15 DIAGNOSIS — M5459 Other low back pain: Secondary | ICD-10-CM | POA: Diagnosis not present

## 2022-05-15 DIAGNOSIS — G8929 Other chronic pain: Secondary | ICD-10-CM | POA: Insufficient documentation

## 2022-05-15 DIAGNOSIS — M6281 Muscle weakness (generalized): Secondary | ICD-10-CM | POA: Diagnosis not present

## 2022-05-15 DIAGNOSIS — M62830 Muscle spasm of back: Secondary | ICD-10-CM | POA: Insufficient documentation

## 2022-05-15 DIAGNOSIS — M5136 Other intervertebral disc degeneration, lumbar region: Secondary | ICD-10-CM | POA: Diagnosis not present

## 2022-05-15 NOTE — Therapy (Signed)
OUTPATIENT PHYSICAL THERAPY THORACOLUMBAR EVALUATION   Patient Name: Maria Bryan MRN: [id number] DOB:01-28-1976, 46 y.o., female Today's Date: 05/15/2022   PT End of Session - 05/15/22 1344     Visit Number 1    Number of Visits 12    Date for PT Re-Evaluation 06/26/22    Authorization Type BCBS    Progress Note Due on Visit 10    PT Start Time 0815    PT Stop Time 0900    PT Time Calculation (min) 45 min    Activity Tolerance Patient tolerated treatment well    Behavior During Therapy [facility] for tasks assessed/performed             Past Medical History:  Diagnosis Date   Hypertension    Obesity    Past Surgical History:  Procedure Laterality Date   ACHILLES TENDON REPAIR Left 2011   CESAREAN SECTION     LAPAROSCOPIC GASTRIC SLEEVE RESECTION N/A 06/15/2018   Procedure: LAPAROSCOPIC GASTRIC SLEEVE RESECTION, UPPER ENDO, ERAS PATHWAY;  Surgeon: Gaynelle Adu, MD;  Location: [facility] ORS;  Service: General;  Laterality: N/A;   TUBAL LIGATION     Patient Active Problem List   Diagnosis Date Noted   Other intervertebral disc degeneration, lumbar region 09/26/2021   OSA (obstructive sleep apnea) 06/15/2018   Chronic lumbosacral pain 06/15/2018   Essential hypertension 05/24/2018   Heart palpitations 01/06/2018   Snoring 01/06/2018   Morbid obesity (HCC) 01/06/2018    PCP: Dorothyann Peng, MD  REFERRING PROVIDER: Doneen Poisson M  REFERRING DIAG:  M51.36 (ICD-10-CM) - Other intervertebral disc degeneration, lumbar region  M54.50,G89.29 (ICD-10-CM) - Chronic bilateral low back pain, unspecified whether sciatica present    Rationale for Evaluation and Treatment Rehabilitation  THERAPY DIAG:  Other low back pain  Muscle spasm of back  Muscle weakness (generalized)  ONSET DATE: 09/04/2021  SUBJECTIVE:                                                                                                                                                                                            SUBJECTIVE STATEMENT: Worked out over several years. Had aquatic therapy in [place] with an ankle injury years ago. Pain is on right side today but can be on both side with radiation at times into LE.  Works sedentary job although has a work station that she can stand. PERTINENT HISTORY:  HTN; obesity; bariatric surgery in 2019   PAIN:  Are you having pain? Yes: NPRS scale: worst 10/10 least 2-3/10 current 7/10 Pain location: r sided LBP with radiation to knee Pain description: ache, sharp Aggravating factors: prolonged  standing Relieving factors: sitting, lying down, ice, massage, stretching, tylenol   PRECAUTIONS: None  WEIGHT BEARING RESTRICTIONS No  FALLS:  Has patient fallen in last 6 months? No  LIVING ENVIRONMENT: Lives with: lives with their family Lives in: House/apartment Stairs: Yes: Internal: 16 steps; on right going up Has following equipment at home: None  OCCUPATION: attorney, sitting  PLOF: Independent  PATIENT GOALS increase strength, decrease pain, get back to exercise program   OBJECTIVE:   DIAGNOSTIC FINDINGS:  Previous MRI scan [place]18/[place] includes significant lower lumbar degenerative changes with disc desiccation 5 mm AP diameter canal at L4-5 severe right moderate left lateral recess narrowing.  Moderate foraminal encroachment.  L5-S1 disc bulge right greater than left extraforaminal disc extension.  Central canal without stenosis.  Moderate left and moderate to severe right foraminal encroachment.   PATIENT SURVEYS:  FOTO 44 with goal of 80  SCREENING FOR RED FLAGS: Bowel or bladder incontinence: No   COGNITION:  Overall cognitive status: Within functional limits for tasks assessed     SENSATION: wfl  MUSCLE LENGTH: wfl  POSTURE: anterior pelvic tilt  PALPATION: TTP paraspinals right sided mid thoracic through lumbar  LUMBAR ROM:   Active  A/PROM  eval  Flexion fullP!   Extension full  Right lateral flexion 75%  Left lateral flexion 75%  Right rotation 10%P!  Left rotation 25%P!   (Blank rows = not tested)  LOWER EXTREMITY ROM:     Active  Right eval Left eval  Hip flexion wfl wfl  Hip extension    Hip abduction    Hip adduction    Hip internal rotation    Hip external rotation    Knee flexion wfl wfl  Knee extension    Ankle dorsiflexion    Ankle plantarflexion    Ankle inversion    Ankle eversion     (Blank rows = not tested)  LOWER EXTREMITY MMT:    MMT Right eval Left eval  Hip flexion 4 4  Hip extension    Hip abduction 5 5  Hip adduction    Hip internal rotation    Hip external rotation    Knee flexion    Knee extension    Ankle dorsiflexion    Ankle plantarflexion    Ankle inversion    Ankle eversion    Core strength 3+/5    LUMBAR SPECIAL TESTS:  Straight leg raise test: Negative, Slump test: Negative, and Stork standing: Negative Compression/distraction test:neg  FUNCTIONAL TESTS:  5 times sit to stand: 25 Timed up and go (TUG): 11  GAIT: Distance walked: 500 ft Assistive device utilized: None Level of assistance: Complete Independence Comments: gait wnl    TODAY'S TREATMENT  Evaluation Introduction to aquatic setting Objective testing   PATIENT EDUCATION:  Education details: Discussed eval findings, rehab rationale and POC and patient is in agreement Person educated: Patient and Spouse Education method: Explanation Education comprehension: verbalized understanding   HOME EXERCISE PROGRAM: [id number] last episode  ASSESSMENT:  CLINICAL IMPRESSION: Patient is a 46 y.o. F who was seen today for physical therapy evaluation and treatment for Lumbar disc degeneration, some L4-5 stenosis with left leg pain. Pt was evaluated by Ortho at [address] [facility]) in January without pt follow up returning for scheduled visits. Similar to [facility] assessment , pt's Le and ROM and flexibility are [facility] . She  does have decreased ROM and flexibility as well as strength in core limited by pain.  No increase in radicular sx  experienced with testing today although she does report radicular sx in RLE earlier today stating they are intermittent. She will benefit aquatic skilled PT to improve functional mobility, increase ROM and strength, decrease pain. Pt may choose to transition on to land based therapy after 4-5 weeks aquatic. At initial evaluation she states she has to been able to tolerate in past   OBJECTIVE IMPAIRMENTS decreased activity tolerance, decreased endurance, decreased ROM, decreased strength, obesity, and pain.   ACTIVITY LIMITATIONS bending, sitting, and standing  PARTICIPATION LIMITATIONS: cleaning, laundry, shopping, community activity, and yard work  PERSONAL FACTORS Past/current experiences and Time since onset of injury/illness/exacerbation are also affecting patient's functional outcome.   REHAB POTENTIAL: Good  CLINICAL DECISION MAKING: Stable/uncomplicated  EVALUATION COMPLEXITY: Low   GOALS: Goals reviewed with patient? Yes  SHORT TERM GOALS: Target date: 06/05/2022  Patient to demonstrate independence in on land HEP  [place] on land program Goal status: INITIAL  2.  Pt will complete 5 X STS in under 15s to demonstrated improved strength and balance Baseline: 25 Goal status: INITIAL  3.  Pt to complete Tug in under 10s Baseline: 11 Goal status: INITIAL     LONG TERM GOALS: Target date: 06/26/2022  Pt will be indep with final Aquatic HEP Baseline: none Goal status: INITIAL  2.  Pt to meet stated FOTO goal of 59 Baseline: 44 Goal status: INITIAL  3.  Pt will increase core strength to 4/5 Baseline: 3+ Goal status: INITIAL  4.  Pt to have improvement in lumbar ROM without pain at end range Baseline:  Goal status: INITIAL    PLAN: PT FREQUENCY: 1-2x/week  PT DURATION: 6 weeks  PLANNED INTERVENTIONS: Therapeutic exercises,  Therapeutic activity, Neuromuscular re-education, Balance training, Gait training, Patient/Family education, Self Care, Joint mobilization, Joint manipulation, Stair training, Aquatic Therapy, Electrical stimulation, Spinal manipulation, Spinal mobilization, Cryotherapy, Moist heat, Taping, Traction, Ultrasound, and Manual therapy.  PLAN FOR NEXT SESSION: Aquatics for LB and LE stretching, trial water Pilates/ core strengthening   Denton Meek, PT MPT 05/15/2022, 1:49 PM

## 2022-05-17 ENCOUNTER — Other Ambulatory Visit: Payer: Self-pay | Admitting: Nurse Practitioner

## 2022-05-20 ENCOUNTER — Other Ambulatory Visit: Payer: Self-pay | Admitting: Nurse Practitioner

## 2022-05-24 ENCOUNTER — Ambulatory Visit (HOSPITAL_BASED_OUTPATIENT_CLINIC_OR_DEPARTMENT_OTHER): Payer: Federal, State, Local not specified - PPO | Admitting: Physical Therapy

## 2022-05-27 DIAGNOSIS — M79672 Pain in left foot: Secondary | ICD-10-CM | POA: Diagnosis not present

## 2022-05-27 DIAGNOSIS — M25562 Pain in left knee: Secondary | ICD-10-CM | POA: Diagnosis not present

## 2022-05-28 ENCOUNTER — Ambulatory Visit (HOSPITAL_BASED_OUTPATIENT_CLINIC_OR_DEPARTMENT_OTHER): Payer: Federal, State, Local not specified - PPO | Attending: Orthopaedic Surgery | Admitting: Physical Therapy

## 2022-05-28 ENCOUNTER — Encounter (HOSPITAL_BASED_OUTPATIENT_CLINIC_OR_DEPARTMENT_OTHER): Payer: Self-pay | Admitting: Physical Therapy

## 2022-05-28 ENCOUNTER — Ambulatory Visit: Payer: Federal, State, Local not specified - PPO

## 2022-05-28 VITALS — BP 118/78 | HR 85 | Temp 98.8°F

## 2022-05-28 DIAGNOSIS — G8929 Other chronic pain: Secondary | ICD-10-CM | POA: Insufficient documentation

## 2022-05-28 DIAGNOSIS — M5459 Other low back pain: Secondary | ICD-10-CM | POA: Diagnosis not present

## 2022-05-28 DIAGNOSIS — M545 Low back pain, unspecified: Secondary | ICD-10-CM | POA: Insufficient documentation

## 2022-05-28 DIAGNOSIS — M25562 Pain in left knee: Secondary | ICD-10-CM | POA: Diagnosis not present

## 2022-05-28 DIAGNOSIS — M6281 Muscle weakness (generalized): Secondary | ICD-10-CM | POA: Insufficient documentation

## 2022-05-28 DIAGNOSIS — M25572 Pain in left ankle and joints of left foot: Secondary | ICD-10-CM | POA: Insufficient documentation

## 2022-05-28 DIAGNOSIS — I10 Essential (primary) hypertension: Secondary | ICD-10-CM

## 2022-05-28 DIAGNOSIS — M62830 Muscle spasm of back: Secondary | ICD-10-CM | POA: Diagnosis not present

## 2022-05-28 NOTE — Progress Notes (Signed)
Patient present today for bpc. Patient states she is taking amlodpine 10mg  in the mornings and Labetalol 200mg  twice daily one at lunch and one at night. She also takes magnesium at night.  BP Readings from Last 3 Encounters:  05/28/22 118/78  04/30/22 (!) 144/80  09/26/21 (!) 161/98    Patient will continue with current medications and follow up with provider as directed.

## 2022-05-28 NOTE — Patient Instructions (Signed)
Hypertension, Adult High blood pressure (hypertension) is when the force of blood pumping through the arteries is too strong. The arteries are the blood vessels that carry blood from the heart throughout the body. Hypertension forces the heart to work harder to pump blood and may cause arteries to become narrow or stiff. Untreated or uncontrolled hypertension can lead to a heart attack, heart failure, a stroke, kidney disease, and other problems. A blood pressure reading consists of a higher number over a lower number. Ideally, your blood pressure should be below 120/80. The first ("top") number is called the systolic pressure. It is a measure of the pressure in your arteries as your heart beats. The second ("bottom") number is called the diastolic pressure. It is a measure of the pressure in your arteries as the heart relaxes. What are the causes? The exact cause of this condition is not known. There are some conditions that result in high blood pressure. What increases the risk? Certain factors may make you more likely to develop high blood pressure. Some of these risk factors are under your control, including: Smoking. Not getting enough exercise or physical activity. Being overweight. Having too much fat, sugar, calories, or salt (sodium) in your diet. Drinking too much alcohol. Other risk factors include: Having a personal history of heart disease, diabetes, high cholesterol, or kidney disease. Stress. Having a family history of high blood pressure and high cholesterol. Having obstructive sleep apnea. Age. The risk increases with age. What are the signs or symptoms? High blood pressure may not cause symptoms. Very high blood pressure (hypertensive crisis) may cause: Headache. Fast or irregular heartbeats (palpitations). Shortness of breath. Nosebleed. Nausea and vomiting. Vision changes. Severe chest pain, dizziness, and seizures. How is this diagnosed? This condition is diagnosed by  measuring your blood pressure while you are seated, with your arm resting on a flat surface, your legs uncrossed, and your feet flat on the floor. The cuff of the blood pressure monitor will be placed directly against the skin of your upper arm at the level of your heart. Blood pressure should be measured at least twice using the same arm. Certain conditions can cause a difference in blood pressure between your right and left arms. If you have a high blood pressure reading during one visit or you have normal blood pressure with other risk factors, you may be asked to: Return on a different day to have your blood pressure checked again. Monitor your blood pressure at home for 1 week or longer. If you are diagnosed with hypertension, you may have other blood or imaging tests to help your health care provider understand your overall risk for other conditions. How is this treated? This condition is treated by making healthy lifestyle changes, such as eating healthy foods, exercising more, and reducing your alcohol intake. You may be referred for counseling on a healthy diet and physical activity. Your health care provider may prescribe medicine if lifestyle changes are not enough to get your blood pressure under control and if: Your systolic blood pressure is above 130. Your diastolic blood pressure is above 80. Your personal target blood pressure may vary depending on your medical conditions, your age, and other factors. Follow these instructions at home: Eating and drinking  Eat a diet that is high in fiber and potassium, and low in sodium, added sugar, and fat. An example of this eating plan is called the DASH diet. DASH stands for Dietary Approaches to Stop Hypertension. To eat this way: Eat   plenty of fresh fruits and vegetables. Try to fill one half of your plate at each meal with fruits and vegetables. Eat whole grains, such as whole-wheat pasta, brown rice, or whole-grain bread. Fill about one  fourth of your plate with whole grains. Eat or drink low-fat dairy products, such as skim milk or low-fat yogurt. Avoid fatty cuts of meat, processed or cured meats, and poultry with skin. Fill about one fourth of your plate with lean proteins, such as fish, chicken without skin, beans, eggs, or tofu. Avoid pre-made and processed foods. These tend to be higher in sodium, added sugar, and fat. Reduce your daily sodium intake. Many people with hypertension should eat less than 1,500 mg of sodium a day. Do not drink alcohol if: Your health care provider tells you not to drink. You are pregnant, may be pregnant, or are planning to become pregnant. If you drink alcohol: Limit how much you have to: 0-1 drink a day for women. 0-2 drinks a day for men. Know how much alcohol is in your drink. In the [place] one drink equals one 12 oz bottle of beer (355 mL), one 5 oz glass of wine (148 mL), or one 1 oz glass of hard liquor (44 mL). Lifestyle  Work with your health care provider to maintain a healthy body weight or to lose weight. Ask what an ideal weight is for you. Get at least 30 minutes of exercise that causes your heart to beat faster (aerobic exercise) most days of the week. Activities may include walking, swimming, or biking. Include exercise to strengthen your muscles (resistance exercise), such as Pilates or lifting weights, as part of your weekly exercise routine. Try to do these types of exercises for 30 minutes at least 3 days a week. Do not use any products that contain nicotine or tobacco. These products include cigarettes, chewing tobacco, and vaping devices, such as e-cigarettes. If you need help quitting, ask your health care provider. Monitor your blood pressure at home as told by your health care provider. Keep all follow-up visits. This is important. Medicines Take over-the-counter and prescription medicines only as told by your health care provider. Follow directions carefully. Blood  pressure medicines must be taken as prescribed. Do not skip doses of blood pressure medicine. Doing this puts you at risk for problems and can make the medicine less effective. Ask your health care provider about side effects or reactions to medicines that you should watch for. Contact a health care provider if you: Think you are having a reaction to a medicine you are taking. Have headaches that keep coming back (recurring). Feel dizzy. Have swelling in your ankles. Have trouble with your vision. Get help right away if you: Develop a severe headache or confusion. Have unusual weakness or numbness. Feel faint. Have severe pain in your chest or abdomen. Vomit repeatedly. Have trouble breathing. These symptoms may be an emergency. Get help right away. Call 911. Do not wait to see if the symptoms will go away. Do not drive yourself to the hospital. Summary Hypertension is when the force of blood pumping through your arteries is too strong. If this condition is not controlled, it may put you at risk for serious complications. Your personal target blood pressure may vary depending on your medical conditions, your age, and other factors. For most people, a normal blood pressure is less than 120/80. Hypertension is treated with lifestyle changes, medicines, or a combination of both. Lifestyle changes include losing weight, eating a healthy,   low-sodium diet, exercising more, and limiting alcohol. This information is not intended to replace advice given to you by your health care provider. Make sure you discuss any questions you have with your health care provider. Document Revised: 06/19/2021 Document Reviewed: 06/19/2021 Elsevier Patient Education  2023 [employer]  

## 2022-05-28 NOTE — Therapy (Signed)
OUTPATIENT PHYSICAL THERAPY THORACOLUMBAR EVALUATION   Patient Name: Maria Bryan MRN: [id number] DOB:1976-02-26, 46 y.o., female Today's Date: 05/28/2022   PT End of Session - 05/28/22 0909     Visit Number 2    Number of Visits 12    Date for PT Re-Evaluation 06/26/22    Authorization Type BCBS    Progress Note Due on Visit 10    PT Start Time 0901    PT Stop Time 0539    PT Time Calculation (min) 43 min    Activity Tolerance Patient tolerated treatment well    Behavior During Therapy [facility] for tasks assessed/performed             Past Medical History:  Diagnosis Date   Hypertension    Obesity    Past Surgical History:  Procedure Laterality Date   ACHILLES TENDON REPAIR Left 2011   CESAREAN SECTION     LAPAROSCOPIC GASTRIC SLEEVE RESECTION N/A 06/15/2018   Procedure: LAPAROSCOPIC GASTRIC SLEEVE RESECTION, UPPER ENDO, ERAS PATHWAY;  Surgeon: Greer Pickerel, MD;  Location: [facility];  Service: General;  Laterality: N/A;   TUBAL LIGATION     Patient Active Problem List   Diagnosis Date Noted   Other intervertebral disc degeneration, lumbar region 09/26/2021   OSA (obstructive sleep apnea) 06/15/2018   Chronic lumbosacral pain 06/15/2018   Essential hypertension 05/24/2018   Heart palpitations 01/06/2018   Snoring 01/06/2018   Morbid obesity ([place]) 01/06/2018    PCP: Glendale Chard, MD  REFERRING PROVIDER: Jean Rosenthal M  REFERRING DIAG:  M51.36 (ICD-10-CM) - Other intervertebral disc degeneration, lumbar region  M54.50,G89.29 (ICD-10-CM) - Chronic bilateral low back pain, unspecified whether sciatica present    Rationale for Evaluation and Treatment Rehabilitation  THERAPY DIAG:  Other low back pain  Muscle spasm of back  Muscle weakness (generalized)  ONSET DATE: 09/04/2021  SUBJECTIVE:                                                                                                                                                                                            SUBJECTIVE STATEMENT: Pt reports she had an evaluation for her L knee at [facility] yesterday; otherwise no new changes.   Marland Kitchen PERTINENT HISTORY:  HTN; obesity; bariatric surgery in 2019   PAIN:  Are you having pain? Yes:  NPRS scale: 4/10 in low back; Lt knee 6/10 Pain location: see above.  Low back radiates to back of L knee  Pain description: ache, sharp Aggravating factors: prolonged standing Relieving factors: sitting, lying down, ice, massage, stretching, tylenol   PRECAUTIONS: None  WEIGHT BEARING RESTRICTIONS No  FALLS:  Has patient fallen in last 6 months? No  LIVING ENVIRONMENT: Lives with: lives with their family Lives in: House/apartment Stairs: Yes: Internal: 16 steps; on right going up Has following equipment at home: None  OCCUPATION: attorney, sitting  PLOF: Independent  PATIENT GOALS increase strength, decrease pain, get back to exercise program   OBJECTIVE:   DIAGNOSTIC FINDINGS:  [place] includes significant lower lumbar degenerative changes with disc desiccation 5 mm AP diameter canal at L4-5 severe right moderate left lateral recess narrowing.  Moderate foraminal encroachment.  L5-S1 disc bulge right greater than left extraforaminal disc extension.  Central canal without stenosis.  Moderate left and moderate to severe right foraminal encroachment.   PATIENT SURVEYS:  FOTO 44 with goal of 70  SCREENING FOR RED FLAGS: Bowel or bladder incontinence: No   COGNITION:  Overall cognitive status: Within functional limits for tasks assessed     SENSATION: wfl  MUSCLE LENGTH: wfl  POSTURE: anterior pelvic tilt  PALPATION: TTP paraspinals right sided mid thoracic through lumbar  LUMBAR ROM:   Active  A/PROM  eval  Flexion fullP!  Extension full  Right lateral flexion 75%  Left lateral flexion 75%  Right rotation 10%P!  Left rotation 25%P!   (Blank rows = not  tested)  LOWER EXTREMITY ROM:     Active  Right eval Left eval  Hip flexion wfl wfl  Hip extension    Hip abduction    Hip adduction    Hip internal rotation    Hip external rotation    Knee flexion wfl wfl  Knee extension    Ankle dorsiflexion    Ankle plantarflexion    Ankle inversion    Ankle eversion     (Blank rows = not tested)  LOWER EXTREMITY MMT:    MMT Right eval Left eval  Hip flexion 4 4  Hip extension    Hip abduction 5 5  Hip adduction    Hip internal rotation    Hip external rotation    Knee flexion    Knee extension    Ankle dorsiflexion    Ankle plantarflexion    Ankle inversion    Ankle eversion    Core strength 3+/5    LUMBAR SPECIAL TESTS:  Straight leg raise test: Negative, Slump test: Negative, and Stork standing: Negative Compression/distraction test:neg  FUNCTIONAL TESTS:  5 times sit to stand: 25 Timed up and go (TUG): 11  GAIT: Distance walked: 500 ft Assistive device utilized: None Level of assistance: Complete Independence Comments: gait wnl    TODAY'S TREATMENT  Pt seen for aquatic therapy today.  Treatment took place in water 3.25-4.5 ft in depth at the [facility] pool. Temp of water was 91.  Pt entered/exited the pool via stairs independently with bilat rail.  Without support:  walking forward, backward, side stepping, forward/backward marching; forward walking kicks  Straddling yellow noodle:  cycling with breast stroke arms; cc ski; jumping jack LEs At wall:  hip openers and crosses; hip abdct x 10 each; hamstring curls x 10; squats x 10; heel raises x 10 At stairs: quad, hamstring and piriformis stretch x 1 rep each LE;  L low back stretch x 1  Pt requires the buoyancy and hydrostatic pressure of water for support, and to offload joints by unweighting joint load by at least 50 % in navel deep water and by at least 75-80% in chest to neck deep water.  Viscosity of the water is  needed for resistance of  strengthening. Water current perturbations provides challenge to standing balance requiring increased core activation.    PATIENT EDUCATION:  Education details: intro to [employer]  Person educated: Patient and Spouse Education method: Explanation Education comprehension: verbalized understanding   HOME EXERCISE PROGRAM: [email or web address] last episode  ASSESSMENT:  CLINICAL IMPRESSION: Pt informed she may get a bill if receiving PT at 2 facilities. She is confident in aquatic environment and able to take direction for therapist on land.  She reported elimination of pain once exercising in water. She feels best when walking facing hot tub (LLE on down slope). Goals are ongoing.     FROM EVAL: Patient is a 46 y.o. F who was seen today for physical therapy evaluation and treatment for Lumbar disc degeneration, some L4-5 stenosis with left leg pain. Pt was evaluated by Ortho at [address] [facility]) in January without pt follow up returning for scheduled visits. Similar to [facility] assessment , pt's Le and ROM and flexibility are [facility] . She does have decreased ROM and flexibility as well as strength in core limited by pain.  No increase in radicular sx experienced with testing today although she does report radicular sx in RLE earlier today stating they are intermittent. She will benefit aquatic skilled PT to improve functional mobility, increase ROM and strength, decrease pain. Pt may choose to transition on to land based therapy after 4-5 weeks aquatic. At initial evaluation she states she has to been able to tolerate in past   OBJECTIVE IMPAIRMENTS decreased activity tolerance, decreased endurance, decreased ROM, decreased strength, obesity, and pain.   ACTIVITY LIMITATIONS bending, sitting, and standing  PARTICIPATION LIMITATIONS: cleaning, laundry, shopping, community activity, and yard work  PERSONAL FACTORS Past/current experiences and Time since onset of injury/illness/exacerbation are also  affecting patient's functional outcome.   REHAB POTENTIAL: Good  CLINICAL DECISION MAKING: Stable/uncomplicated  EVALUATION COMPLEXITY: Low   GOALS: Goals reviewed with patient? Yes  SHORT TERM GOALS: Target date: 06/05/22  Patient to demonstrate independence in on land HEP  Baseline:re-instruct on land program Goal status: INITIAL  2.  Pt will complete 5 X STS in under 15s to demonstrated improved strength and balance Baseline: 25 Goal status: INITIAL  3.  Pt to complete Tug in under 10s Baseline: 11 Goal status: INITIAL     LONG TERM GOALS: Target date: 06/26/22  Pt will be indep with final Aquatic HEP Baseline: none Goal status: INITIAL  2.  Pt to meet stated FOTO goal of 59 Baseline: 44 Goal status: INITIAL  3.  Pt will increase core strength to 4/5 Baseline: 3+ Goal status: INITIAL  4.  Pt to have improvement in lumbar ROM without pain at end range Baseline:  Goal status: INITIAL    PLAN: PT FREQUENCY: 1-2x/week  PT DURATION: 6 weeks  PLANNED INTERVENTIONS: Therapeutic exercises, Therapeutic activity, Neuromuscular re-education, Balance training, Gait training, Patient/Family education, Self Care, Joint mobilization, Joint manipulation, Stair training, Aquatic Therapy, Electrical stimulation, Spinal manipulation, Spinal mobilization, Cryotherapy, Moist heat, Taping, Traction, Ultrasound, and Manual therapy.  PLAN FOR NEXT SESSION: Aquatics for LB and LE stretching, trial water Pilates/ core strengthening  Mayer Camel, PTA 05/28/22 1:44 PM [facility] [address] [place], [place], [address] Phone: [phone]   Fax:  [phone]

## 2022-05-31 ENCOUNTER — Ambulatory Visit (HOSPITAL_BASED_OUTPATIENT_CLINIC_OR_DEPARTMENT_OTHER): Payer: Federal, State, Local not specified - PPO | Admitting: Physical Therapy

## 2022-06-01 ENCOUNTER — Other Ambulatory Visit: Payer: Self-pay | Admitting: Nurse Practitioner

## 2022-06-05 ENCOUNTER — Ambulatory Visit (HOSPITAL_BASED_OUTPATIENT_CLINIC_OR_DEPARTMENT_OTHER): Payer: Federal, State, Local not specified - PPO | Admitting: Physical Therapy

## 2022-06-05 DIAGNOSIS — M6281 Muscle weakness (generalized): Secondary | ICD-10-CM | POA: Diagnosis not present

## 2022-06-05 DIAGNOSIS — M25562 Pain in left knee: Secondary | ICD-10-CM | POA: Diagnosis not present

## 2022-06-05 DIAGNOSIS — M5459 Other low back pain: Secondary | ICD-10-CM | POA: Diagnosis not present

## 2022-06-05 DIAGNOSIS — M545 Low back pain, unspecified: Secondary | ICD-10-CM | POA: Diagnosis not present

## 2022-06-05 DIAGNOSIS — M62830 Muscle spasm of back: Secondary | ICD-10-CM

## 2022-06-05 DIAGNOSIS — G8929 Other chronic pain: Secondary | ICD-10-CM | POA: Diagnosis not present

## 2022-06-05 DIAGNOSIS — M25572 Pain in left ankle and joints of left foot: Secondary | ICD-10-CM | POA: Diagnosis not present

## 2022-06-05 NOTE — Therapy (Signed)
OUTPATIENT PHYSICAL THERAPY THORACOLUMBAR TREATMENT NOTE   Patient Name: Maria Bryan MRN: [id number] DOB:02/23/1976, 46 y.o., female Today's Date: 06/05/2022   PT End of Session - 06/05/22 0907     Visit Number 3    Number of Visits 12    Date for PT Re-Evaluation 06/26/22    Authorization Type BCBS    Progress Note Due on Visit 10    PT Start Time 0900    PT Stop Time 0941    PT Time Calculation (min) 41 min    Activity Tolerance Patient tolerated treatment well    Behavior During Therapy [facility] for tasks assessed/performed              Past Medical History:  Diagnosis Date   Hypertension    Obesity    Past Surgical History:  Procedure Laterality Date   ACHILLES TENDON REPAIR Left 2011   CESAREAN SECTION     LAPAROSCOPIC GASTRIC SLEEVE RESECTION N/A 06/15/2018   Procedure: LAPAROSCOPIC GASTRIC SLEEVE RESECTION, UPPER ENDO, ERAS PATHWAY;  Surgeon: Gaynelle Adu, MD;  Location: [facility];  Service: General;  Laterality: N/A;   TUBAL LIGATION     Patient Active Problem List   Diagnosis Date Noted   Other intervertebral disc degeneration, lumbar region 09/26/2021   OSA (obstructive sleep apnea) 06/15/2018   Chronic lumbosacral pain 06/15/2018   Essential hypertension 05/24/2018   Heart palpitations 01/06/2018   Snoring 01/06/2018   Morbid obesity ([facility]) 01/06/2018    PCP: Dorothyann Peng, MD  REFERRING PROVIDER: Doneen Poisson M  REFERRING DIAG:  M51.36 (ICD-10-CM) - Other intervertebral disc degeneration, lumbar region  M54.50,G89.29 (ICD-10-CM) - Chronic bilateral low back pain, unspecified whether sciatica present    Rationale for Evaluation and Treatment Rehabilitation  THERAPY DIAG:  Other low back pain  Muscle spasm of back  Muscle weakness (generalized)  ONSET DATE: 09/04/2021  SUBJECTIVE:                                                                                                                                                                                            SUBJECTIVE STATEMENT: "I'm feeling encouraged".  Pt reports she is trying to stretch more.  She has noticed reduced numbness in Lt foot.     PERTINENT HISTORY:  HTN; obesity; bariatric surgery in 2019   PAIN:  Are you having pain? Yes:  NPRS scale: 4/10 in low back; Lt knee 6/10 Pain location: see above.  Low back radiates to back of L knee  Pain description: ache, sharp Aggravating factors: prolonged standing Relieving factors: sitting, lying down, ice, massage, stretching, tylenol   PRECAUTIONS:  None  WEIGHT BEARING RESTRICTIONS No  FALLS:  Has patient fallen in last 6 months? No  LIVING ENVIRONMENT: Lives with: lives with their family Lives in: House/apartment Stairs: Yes: Internal: 16 steps; on right going up Has following equipment at home: None  OCCUPATION: attorney, sitting  PLOF: Independent  PATIENT GOALS increase strength, decrease pain, get back to exercise program   OBJECTIVE:   DIAGNOSTIC FINDINGS:  Previous MRI scan [place] includes significant lower lumbar degenerative changes with disc desiccation 5 mm AP diameter canal at L4-5 severe right moderate left lateral recess narrowing.  Moderate foraminal encroachment.  L5-S1 disc bulge right greater than left extraforaminal disc extension.  Central canal without stenosis.  Moderate left and moderate to severe right foraminal encroachment.   PATIENT SURVEYS:  FOTO 44 with goal of 71  SCREENING FOR RED FLAGS: Bowel or bladder incontinence: No   COGNITION:  Overall cognitive status: Within functional limits for tasks assessed     SENSATION: wfl  MUSCLE LENGTH: wfl  POSTURE: anterior pelvic tilt  PALPATION: TTP paraspinals right sided mid thoracic through lumbar  LUMBAR ROM:   Active  A/PROM  eval  Flexion fullP!  Extension full  Right lateral flexion 75%  Left lateral flexion 75%  Right rotation 10%P!  Left rotation 25%P!    (Blank rows = not tested)  LOWER EXTREMITY ROM:     Active  Right eval Left eval  Hip flexion wfl wfl  Hip extension    Hip abduction    Hip adduction    Hip internal rotation    Hip external rotation    Knee flexion wfl wfl  Knee extension    Ankle dorsiflexion    Ankle plantarflexion    Ankle inversion    Ankle eversion     (Blank rows = not tested)  LOWER EXTREMITY MMT:    MMT Right eval Left eval  Hip flexion 4 4  Hip extension    Hip abduction 5 5  Hip adduction    Hip internal rotation    Hip external rotation    Knee flexion    Knee extension    Ankle dorsiflexion    Ankle plantarflexion    Ankle inversion    Ankle eversion    Core strength 3+/5    LUMBAR SPECIAL TESTS:  Straight leg raise test: Negative, Slump test: Negative, and Stork standing: Negative Compression/distraction test:neg  FUNCTIONAL TESTS:  5 times sit to stand: 25 Timed up and go (TUG): 11  GAIT: Distance walked: 500 ft Assistive device utilized: None Level of assistance: Complete Independence Comments: gait wnl    TODAY'S TREATMENT  Pt seen for aquatic therapy today.  Treatment took place in water 3.25-4.5 ft in depth at the [facility] pool. Temp of water was 92.  Pt entered/exited the pool via stairs independently with bilat rail.  Without support:  walking forward, backward, side stepping, forward/backward marching; forward walking kicks  Holding rainbow hand buoys:  side stepping, and side step with squat.   Staggered stance with kick board row x 10 each leg forward Wide stance with short blue noodle pull down to thighs x 12, cues for relax knees and ab set Straddling yellow noodle:  cycling with breast stroke arms; cc ski; jumping jack LEs -cues for form and speed - 2 rounds  At wall:  hip abdct x 10 each; hurdle leg x 10 each ; squats x 10; heel raises x 10 At stairs: quad, hamstring and piriformis stretch  x 1 rep each LE;   Pt requires the buoyancy and  hydrostatic pressure of water for support, and to offload joints by unweighting joint load by at least 50 % in navel deep water and by at least 75-80% in chest to neck deep water.  Viscosity of the water is needed for resistance of strengthening. Water current perturbations provides challenge to standing balance requiring increased core activation.    PATIENT EDUCATION:  Education details: aquatics progressions and modifications; HEP (land stretches)  Person educated: Patient Education method: Explanation Education comprehension: verbalized understanding   HOME EXERCISE PROGRAM: [email or web address] (last episode;  edited on 06/05/2022) Access Code: [email or web address] URL: [email or web address] Date: 06/05/2022 Prepared by: [facility] [address] [facility]  Exercises - Supine Bridge  - 1 x daily - 7 x weekly - 1 sets - 10-15 reps - Standing Lumbar Extension  - 5 x daily - 7 x weekly - 1 sets - 2 reps - 5 seconds hold - Seated Hamstring Stretch  - 1 x daily - 7 x weekly - 1 sets - 2 reps - Seated Piriformis Stretch  - 1 x daily - 7 x weekly - 1 sets - 2 reps - 20 seconds hold - Quadricep Stretch with Chair and Counter Support  - 1 x daily - 7 x weekly - 1 sets - 2 reps - 20 seconds hold ASSESSMENT:  CLINICAL IMPRESSION: Pt able to increase amount of exercises completed with report of elimination of low back pain and reduction of knee pain to 2/10. Minor cues for form throughout. She feels best when walking facing hot tub (LLE on down slope).Issued land stretches per pt request.  Goals are ongoing.     FROM EVAL: Patient is a 46 y.o. F who was seen today for physical therapy evaluation and treatment for Lumbar disc degeneration, some L4-5 stenosis with left leg pain. Pt was evaluated by Ortho at [address] [facility]) in January without pt follow up returning for scheduled visits. Similar to [facility] assessment , pt's Le and ROM and flexibility are [facility] . She does have decreased ROM and  flexibility as well as strength in core limited by pain.  No increase in radicular sx experienced with testing today although she does report radicular sx in RLE earlier today stating they are intermittent. She will benefit aquatic skilled PT to improve functional mobility, increase ROM and strength, decrease pain. Pt may choose to transition on to land based therapy after 4-5 weeks aquatic.    OBJECTIVE IMPAIRMENTS decreased activity tolerance, decreased endurance, decreased ROM, decreased strength, obesity, and pain.   ACTIVITY LIMITATIONS bending, sitting, and standing  PARTICIPATION LIMITATIONS: cleaning, laundry, shopping, community activity, and yard work  PERSONAL FACTORS Past/current experiences and Time since onset of injury/illness/exacerbation are also affecting patient's functional outcome.   REHAB POTENTIAL: Good  CLINICAL DECISION MAKING: Stable/uncomplicated  EVALUATION COMPLEXITY: Low   GOALS: Goals reviewed with patient? Yes  SHORT TERM GOALS: Target date: 06/05/22  Patient to demonstrate independence in on land HEP  Baseline:re-instruct on land program Goal status: INITIAL  2.  Pt will complete 5 X STS in under 15s to demonstrated improved strength and balance Baseline: 25 Goal status: INITIAL  3.  Pt to complete Tug in under 10s Baseline: 11 Goal status: INITIAL     LONG TERM GOALS: Target date: 06/26/22  Pt will be indep with final Aquatic HEP Baseline: none Goal status: INITIAL  2.  Pt to meet stated FOTO goal of 59 Baseline: 44  Goal status: INITIAL  3.  Pt will increase core strength to 4/5 Baseline: 3+ Goal status: INITIAL  4.  Pt to have improvement in lumbar ROM without pain at end range Baseline:  Goal status: INITIAL    PLAN: PT FREQUENCY: 1-2x/week  PT DURATION: 6 weeks  PLANNED INTERVENTIONS: Therapeutic exercises, Therapeutic activity, Neuromuscular re-education, Balance training, Gait training, Patient/Family education, Self  Care, Joint mobilization, Joint manipulation, Stair training, Aquatic Therapy, Electrical stimulation, Spinal manipulation, Spinal mobilization, Cryotherapy, Moist heat, Taping, Traction, Ultrasound, and Manual therapy.  PLAN FOR NEXT SESSION: Aquatics for LB and LE stretching, trial water Pilates/ core strengthening  Kerin Perna, PTA 06/05/22 9:40 AM [facility] [address] [place], [place], [address] Phone: [phone]   Fax:  [phone]

## 2022-06-07 ENCOUNTER — Ambulatory Visit (HOSPITAL_BASED_OUTPATIENT_CLINIC_OR_DEPARTMENT_OTHER): Payer: Federal, State, Local not specified - PPO | Admitting: Physical Therapy

## 2022-06-07 ENCOUNTER — Encounter (HOSPITAL_BASED_OUTPATIENT_CLINIC_OR_DEPARTMENT_OTHER): Payer: Self-pay | Admitting: Physical Therapy

## 2022-06-07 DIAGNOSIS — M6281 Muscle weakness (generalized): Secondary | ICD-10-CM

## 2022-06-07 DIAGNOSIS — M545 Low back pain, unspecified: Secondary | ICD-10-CM | POA: Diagnosis not present

## 2022-06-07 DIAGNOSIS — M25572 Pain in left ankle and joints of left foot: Secondary | ICD-10-CM | POA: Diagnosis not present

## 2022-06-07 DIAGNOSIS — M5459 Other low back pain: Secondary | ICD-10-CM

## 2022-06-07 DIAGNOSIS — M25562 Pain in left knee: Secondary | ICD-10-CM | POA: Diagnosis not present

## 2022-06-07 DIAGNOSIS — M62830 Muscle spasm of back: Secondary | ICD-10-CM

## 2022-06-07 DIAGNOSIS — G8929 Other chronic pain: Secondary | ICD-10-CM | POA: Diagnosis not present

## 2022-06-07 NOTE — Therapy (Signed)
OUTPATIENT PHYSICAL THERAPY THORACOLUMBAR TREATMENT NOTE   Patient Name: Maria Bryan MRN: [id number] DOB:1976-01-28, 46 y.o., female Today's Date: 06/07/2022   PT End of Session - 06/07/22 0905     Visit Number 4    Number of Visits 12    Date for PT Re-Evaluation 06/26/22    Authorization Type BCBS    PT Start Time 0859    PT Stop Time 0940    PT Time Calculation (min) 41 min    Activity Tolerance Patient tolerated treatment well    Behavior During Therapy [facility] for tasks assessed/performed              Past Medical History:  Diagnosis Date   Hypertension    Obesity    Past Surgical History:  Procedure Laterality Date   ACHILLES TENDON REPAIR Left 2011   CESAREAN SECTION     LAPAROSCOPIC GASTRIC SLEEVE RESECTION N/A 06/15/2018   Procedure: LAPAROSCOPIC GASTRIC SLEEVE RESECTION, UPPER ENDO, ERAS PATHWAY;  Surgeon: Greer Pickerel, MD;  Location: [facility];  Service: General;  Laterality: N/A;   TUBAL LIGATION     Patient Active Problem List   Diagnosis Date Noted   Other intervertebral disc degeneration, lumbar region 09/26/2021   OSA (obstructive sleep apnea) 06/15/2018   Chronic lumbosacral pain 06/15/2018   Essential hypertension 05/24/2018   Heart palpitations 01/06/2018   Snoring 01/06/2018   Morbid obesity ([place]) 01/06/2018    PCP: Glendale Chard, MD  REFERRING PROVIDER: Jean Rosenthal M  REFERRING DIAG:  M51.36 (ICD-10-CM) - Other intervertebral disc degeneration, lumbar region  M54.50,G89.29 (ICD-10-CM) - Chronic bilateral low back pain, unspecified whether sciatica present    Rationale for Evaluation and Treatment Rehabilitation  THERAPY DIAG:  Other low back pain  Muscle spasm of back  Muscle weakness (generalized)  Chronic right-sided low back pain without sciatica  ONSET DATE: 09/04/2021  SUBJECTIVE:                                                                                                                                                                                            SUBJECTIVE STATEMENT: "I felt good after last session, I felt opened up"    PERTINENT HISTORY:  HTN; obesity; bariatric surgery in 2019   PAIN:  Are you having pain? No  - just stiffness  NPRS scale: 0/10  Pain location: R lower back, Lt knee   Pain description: stiff  Aggravating factors: prolonged standing Relieving factors: sitting, lying down, ice, massage, stretching, tylenol   PRECAUTIONS: None  WEIGHT BEARING RESTRICTIONS No  FALLS:  Has patient fallen in last 6 months? No  LIVING  ENVIRONMENT: Lives with: lives with their family Lives in: House/apartment Stairs: Yes: Internal: 16 steps; on right going up Has following equipment at home: None  OCCUPATION: attorney, sitting  PLOF: Independent  PATIENT GOALS increase strength, decrease pain, get back to exercise program   OBJECTIVE:   DIAGNOSTIC FINDINGS:  [place] includes significant lower lumbar degenerative changes with disc desiccation 5 mm AP diameter canal at L4-5 severe right moderate left lateral recess narrowing.  Moderate foraminal encroachment.  L5-S1 disc bulge right greater than left extraforaminal disc extension.  Central canal without stenosis.  Moderate left and moderate to severe right foraminal encroachment.   PATIENT SURVEYS:  FOTO 44 with goal of 58  SCREENING FOR RED FLAGS: Bowel or bladder incontinence: No   COGNITION:  Overall cognitive status: Within functional limits for tasks assessed     SENSATION: wfl  MUSCLE LENGTH: wfl  POSTURE: anterior pelvic tilt  PALPATION: TTP paraspinals right sided mid thoracic through lumbar  LUMBAR ROM:   Active  A/PROM  eval  Flexion fullP!  Extension full  Right lateral flexion 75%  Left lateral flexion 75%  Right rotation 10%P!  Left rotation 25%P!   (Blank rows = not tested)  LOWER EXTREMITY ROM:     Active  Right eval  Left eval  Hip flexion wfl wfl  Hip extension    Hip abduction    Hip adduction    Hip internal rotation    Hip external rotation    Knee flexion wfl wfl  Knee extension    Ankle dorsiflexion    Ankle plantarflexion    Ankle inversion    Ankle eversion     (Blank rows = not tested)  LOWER EXTREMITY MMT:    MMT Right eval Left eval  Hip flexion 4 4  Hip extension    Hip abduction 5 5  Hip adduction    Hip internal rotation    Hip external rotation    Knee flexion    Knee extension    Ankle dorsiflexion    Ankle plantarflexion    Ankle inversion    Ankle eversion    Core strength 3+/5    LUMBAR SPECIAL TESTS:  Straight leg raise test: Negative, Slump test: Negative, and Stork standing: Negative Compression/distraction test:neg  FUNCTIONAL TESTS:  5 times sit to stand: 25 Timed up and go (TUG): 11  GAIT: Distance walked: 500 ft Assistive device utilized: None Level of assistance: Complete Independence Comments: gait wnl    TODAY'S TREATMENT  Pt seen for aquatic therapy today.  Treatment took place in water 3.25-4.5 ft in depth at the [facility]. Temp of water was 92.  Pt entered/exited the pool via stairs independently with bilat rail.  Without support:  walking forward, backward walking and  side steppin Holding rainbow hand buoys:  side step with squat with arm add/abdct;  forward walking kicks with hand buoys on surface   Wide stance with thin square noodle pull down to thighs x 12, cues for  form At wall:  hip abdct x 10 each; Hip ext x10 each ; squats x 10; heel raises x 10 - 2 sets (2nd set of heel raises as single leg) Hip hinge with arm reach on kick board (back near wall) x 10 High knee marching forward/backward  Staggered stance with kick board row x 10 each leg forward- 2 sets Straddling yellow noodle:  cycling with breast stroke arms; cc ski; jumping jack LEs -cues for  form and speed - 2 rounds  At stairs: quad, hamstring and  piriformis stretch x 2 rep each LE;   Pt requires the buoyancy and hydrostatic pressure of water for support, and to offload joints by unweighting joint load by at least 50 % in navel deep water and by at least 75-80% in chest to neck deep water.  Viscosity of the water is needed for resistance of strengthening. Water current perturbations provides challenge to standing balance requiring increased core activation.    PATIENT EDUCATION:  Education details: aquatics progressions and modifications; HEP (land stretches) - issued another copy 10/13 Person educated: Patient Education method: Explanation Education comprehension: verbalized understanding   HOME EXERCISE PROGRAM: [email or web address] (last episode;  edited on 06/05/2022) Access Code: [email or web address] URL: [email or web address] Date: 06/05/2022 Prepared by: [facility] - Outpatient Rehab - [address]  Exercises - Supine Bridge  - 1 x daily - 7 x weekly - 1 sets - 10-15 reps - Standing Lumbar Extension  - 5 x daily - 7 x weekly - 1 sets - 2 reps - 5 seconds hold - Seated Hamstring Stretch  - 1 x daily - 7 x weekly - 1 sets - 2 reps - Seated Piriformis Stretch  - 1 x daily - 7 x weekly - 1 sets - 2 reps - 20 seconds hold - Quadricep Stretch with Chair and Counter Support  - 1 x daily - 7 x weekly - 1 sets - 2 reps - 20 seconds hold ASSESSMENT:  CLINICAL IMPRESSION: Pt able to increase amount of resistance used for ab set with noodle pull downs, as well as volume of exercises without increase in pain. Pt no longer feels a difference in the direction she faces (slope of pool) as she did when she started. Progressing well towards goals.   Will try supported back float next session.     FROM EVAL: Patient is a 46 y.o. F who was seen today for physical therapy evaluation and treatment for Lumbar disc degeneration, some L4-5 stenosis with left leg pain. Pt was evaluated by Ortho at [address] [facility]) in January without pt follow up  returning for scheduled visits. Similar to [facility] assessment , pt's Le and ROM and flexibility are [facility] . She does have decreased ROM and flexibility as well as strength in core limited by pain.  No increase in radicular sx experienced with testing today although she does report radicular sx in RLE earlier today stating they are intermittent. She will benefit aquatic skilled PT to improve functional mobility, increase ROM and strength, decrease pain. Pt may choose to transition on to land based therapy after 4-5 weeks aquatic.    OBJECTIVE IMPAIRMENTS decreased activity tolerance, decreased endurance, decreased ROM, decreased strength, obesity, and pain.   ACTIVITY LIMITATIONS bending, sitting, and standing  PARTICIPATION LIMITATIONS: cleaning, laundry, shopping, community activity, and yard work  PERSONAL FACTORS Past/current experiences and Time since onset of injury/illness/exacerbation are also affecting patient's functional outcome.   REHAB POTENTIAL: Good  CLINICAL DECISION MAKING: Stable/uncomplicated  EVALUATION COMPLEXITY: Low   GOALS: Goals reviewed with patient? Yes  SHORT TERM GOALS: Target date: 06/05/22  Patient to demonstrate independence in on land HEP  Prue on land program Goal status: INITIAL  2.  Pt will complete 5 X STS in under 15s to demonstrated improved strength and balance Baseline: 25 Goal status: INITIAL  3.  Pt to complete Tug in under 10s Baseline: 11 Goal status: INITIAL     LONG TERM GOALS: Target date: 06/26/22  Pt will be indep with final Aquatic HEP Baseline: none Goal status: INITIAL  2.  Pt to meet stated FOTO goal of 59 Baseline: 44 Goal status: INITIAL  3.  Pt will increase core strength to 4/5 Baseline: 3+ Goal status: INITIAL  4.  Pt to have improvement in lumbar ROM without pain at end range Baseline:  Goal status: INITIAL    PLAN: PT FREQUENCY: 1-2x/week  PT DURATION: 6 weeks  PLANNED  INTERVENTIONS: Therapeutic exercises, Therapeutic activity, Neuromuscular re-education, Balance training, Gait training, Patient/Family education, Self Care, Joint mobilization, Joint manipulation, Stair training, Aquatic Therapy, Electrical stimulation, Spinal manipulation, Spinal mobilization, Cryotherapy, Moist heat, Taping, Traction, Ultrasound, and Manual therapy.  PLAN FOR NEXT SESSION: Aquatics for LB and LE stretching, trial water Pilates/ core strengthening  Kerin Perna, PTA 06/07/22 12:39 PM [facility] [place], [place], [address] Phone: [phone]   Fax:  [phone]

## 2022-06-10 DIAGNOSIS — M1712 Unilateral primary osteoarthritis, left knee: Secondary | ICD-10-CM | POA: Diagnosis not present

## 2022-06-10 DIAGNOSIS — M25572 Pain in left ankle and joints of left foot: Secondary | ICD-10-CM | POA: Diagnosis not present

## 2022-06-11 NOTE — Therapy (Signed)
OUTPATIENT PHYSICAL THERAPY THORACOLUMBAR TREATMENT NOTE   Patient Name: Maria Bryan MRN: [id number] DOB:1976-01-05, 46 y.o., female Today's Date: 06/12/2022   PT End of Session - 06/12/22 1345     Visit Number 5    Number of Visits 12    Date for PT Re-Evaluation 06/26/22    Authorization Type BCBS    PT Start Time 0901    PT Stop Time 0945    PT Time Calculation (min) 44 min    Activity Tolerance Patient tolerated treatment well    Behavior During Therapy [facility] for tasks assessed/performed               Past Medical History:  Diagnosis Date   Hypertension    Obesity    Past Surgical History:  Procedure Laterality Date   ACHILLES TENDON REPAIR Left 2011   CESAREAN SECTION     LAPAROSCOPIC GASTRIC SLEEVE RESECTION N/A 06/15/2018   Procedure: LAPAROSCOPIC GASTRIC SLEEVE RESECTION, UPPER ENDO, ERAS PATHWAY;  Surgeon: Gaynelle Adu, MD;  Location: [facility];  Service: General;  Laterality: N/A;   TUBAL LIGATION     Patient Active Problem List   Diagnosis Date Noted   Other intervertebral disc degeneration, lumbar region 09/26/2021   OSA (obstructive sleep apnea) 06/15/2018   Chronic lumbosacral pain 06/15/2018   Essential hypertension 05/24/2018   Heart palpitations 01/06/2018   Snoring 01/06/2018   Morbid obesity ([facility]) 01/06/2018    PCP: Dorothyann Peng, MD  REFERRING PROVIDER: Doneen Poisson M  REFERRING DIAG:  M51.36 (ICD-10-CM) - Other intervertebral disc degeneration, lumbar region  M54.50,G89.29 (ICD-10-CM) - Chronic bilateral low back pain, unspecified whether sciatica present    Rationale for Evaluation and Treatment Rehabilitation  THERAPY DIAG:  Other low back pain  Muscle spasm of back  Muscle weakness (generalized)  Chronic right-sided low back pain without sciatica  ONSET DATE: 09/04/2021  SUBJECTIVE:                                                                                                                                                                                            SUBJECTIVE STATEMENT: "No back pain today just stiffness" Pt arrives with new referral for Left knee and ankle OA     PERTINENT HISTORY:  HTN; obesity; bariatric surgery in 2019   PAIN:  Are you having pain? No  - just stiffness  NPRS scale: 0/10  Pain location: R lower back, Lt knee   Pain description: stiff  Aggravating factors: prolonged standing Relieving factors: sitting, lying down, ice, massage, stretching, tylenol   PRECAUTIONS: None  WEIGHT BEARING RESTRICTIONS No  FALLS:  Has  patient fallen in last 6 months? No  LIVING ENVIRONMENT: Lives with: lives with their family Lives in: House/apartment Stairs: Yes: Internal: 16 steps; on right going up Has following equipment at home: None  OCCUPATION: attorney, sitting  PLOF: Independent  PATIENT GOALS increase strength, decrease pain, get back to exercise program   OBJECTIVE:   DIAGNOSTIC FINDINGS:  Previous MRI [place] includes significant lower lumbar degenerative changes with disc desiccation 5 mm AP diameter canal at L4-5 severe right moderate left lateral recess narrowing.  Moderate foraminal encroachment.  L5-S1 disc bulge right greater than left extraforaminal disc extension.  Central canal without stenosis.  Moderate left and moderate to severe right foraminal encroachment.   PATIENT SURVEYS:  FOTO 44 with goal of 70  SCREENING FOR RED FLAGS: Bowel or bladder incontinence: No   COGNITION:  Overall cognitive status: Within functional limits for tasks assessed     SENSATION: wfl  MUSCLE LENGTH: wfl  POSTURE: anterior pelvic tilt  PALPATION: TTP paraspinals right sided mid thoracic through lumbar  LUMBAR ROM:   Active  A/PROM  eval  Flexion fullP!  Extension full  Right lateral flexion 75%  Left lateral flexion 75%  Right rotation 10%P!  Left rotation 25%P!   (Blank rows = not tested)  LOWER  EXTREMITY ROM:     Active  Right eval Left eval  Hip flexion wfl wfl  Hip extension    Hip abduction    Hip adduction    Hip internal rotation    Hip external rotation    Knee flexion wfl wfl  Knee extension    Ankle dorsiflexion    Ankle plantarflexion    Ankle inversion    Ankle eversion     (Blank rows = not tested)  LOWER EXTREMITY MMT:    MMT Right eval Left eval  Hip flexion 4 4  Hip extension    Hip abduction 5 5  Hip adduction    Hip internal rotation    Hip external rotation    Knee flexion    Knee extension    Ankle dorsiflexion    Ankle plantarflexion    Ankle inversion    Ankle eversion    Core strength 3+/5    LUMBAR SPECIAL TESTS:  Straight leg raise test: Negative, Slump test: Negative, and Stork standing: Negative Compression/distraction test:neg  FUNCTIONAL TESTS:  5 times sit to stand: 25 Timed up and go (TUG): 11  GAIT: Distance walked: 500 ft Assistive device utilized: None Level of assistance: Complete Independence Comments: gait wnl    TODAY'S TREATMENT  Pt seen for aquatic therapy today.  Treatment took place in water 3.25-4.5 ft in depth at the [facility]. Temp of water was 92.  Pt entered/exited the pool via stairs independently with bilat rail.  Without support:  walking forward, backward walking and  side steppin Holding rainbow hand buoys:  side step with squat with arm add/abdct;  Hip hinge with arm reach on kick board (back near wall) x 10 Staggered stance with kick board row x 10 each leg forward- 2 sets Cues for increased speed for increased resistance as tolerated Kick board press (3-4 inches) hold for 10s x 6 Resisted lateral rotation using kick board R/L x7-8 ea. Cues and demonstration for execution. At wall:  hip abdct x 15 each; Hip ext x15 each ; squats x 15 cues for quad and glut set and range; heel raises  and TR Toe walking (knees extended for gastroc use  then knees bent for soleus) x 4  widths Heel walking x 4 widths At stairs: gastroc stretch Straddling yellow noodle:  cycling with breast stroke arms; cc ski; jumping jack LEs -cues for form and speed - 2 rounds    Pt requires the buoyancy and hydrostatic pressure of water for support, and to offload joints by unweighting joint load by at least 50 % in navel deep water and by at least 75-80% in chest to neck deep water.  Viscosity of the water is needed for resistance of strengthening. Water current perturbations provides challenge to standing balance requiring increased core activation.    PATIENT EDUCATION:  Education details: aquatics progressions and modifications; HEP (land stretches) - issued another copy 10/13 Person educated: Patient Education method: Explanation Education comprehension: verbalized understanding   HOME EXERCISE PROGRAM: [email or web address] (last episode;  edited on 06/05/2022) Access Code: [email or web address] URL: [email or web address] Date: 06/05/2022 Prepared by: [facility] - Outpatient Rehab - [address]  Exercises - Supine Bridge  - 1 x daily - 7 x weekly - 1 sets - 10-15 reps - Standing Lumbar Extension  - 5 x daily - 7 x weekly - 1 sets - 2 reps - 5 seconds hold - Seated Hamstring Stretch  - 1 x daily - 7 x weekly - 1 sets - 2 reps - Seated Piriformis Stretch  - 1 x daily - 7 x weekly - 1 sets - 2 reps - 20 seconds hold - Quadricep Stretch with Chair and Counter Support  - 1 x daily - 7 x weekly - 1 sets - 2 reps - 20 seconds hold ASSESSMENT:  CLINICAL IMPRESSION: Pt scheduled for MRI of left knee and ankle. Has new referral for left knee and ankle OA (which will be addressed going forward after MRI results). She tolerates progression of core strength well today adding resisted rotation. (Toleration will be monitored next session) Reduction in LB stiffness with session although some increase in right knee pain. Goals ongoing     FROM EVAL: Patient is a 46 y.o. F who was seen today for  physical therapy evaluation and treatment for Lumbar disc degeneration, some L4-5 stenosis with left leg pain. Pt was evaluated by Ortho at [address] [facility]) in January without pt follow up returning for scheduled visits. Similar to [facility] assessment , pt's Le and ROM and flexibility are [facility] . She does have decreased ROM and flexibility as well as strength in core limited by pain.  No increase in radicular sx experienced with testing today although she does report radicular sx in RLE earlier today stating they are intermittent. She will benefit aquatic skilled PT to improve functional mobility, increase ROM and strength, decrease pain. Pt may choose to transition on to land based therapy after 4-5 weeks aquatic.    OBJECTIVE IMPAIRMENTS decreased activity tolerance, decreased endurance, decreased ROM, decreased strength, obesity, and pain.   ACTIVITY LIMITATIONS bending, sitting, and standing  PARTICIPATION LIMITATIONS: cleaning, laundry, shopping, community activity, and yard work  PERSONAL FACTORS Past/current experiences and Time since onset of injury/illness/exacerbation are also affecting patient's functional outcome.   REHAB POTENTIAL: Good  CLINICAL DECISION MAKING: Stable/uncomplicated  EVALUATION COMPLEXITY: Low   GOALS: Goals reviewed with patient? Yes  SHORT TERM GOALS: Target date: 06/05/22  Patient to demonstrate independence in on land HEP  Baseline:re-instruct on land program Goal status: INITIAL  2.  Pt will complete 5 X STS in under 15s to demonstrated improved strength and balance Baseline: 25 Goal status: INITIAL  3.  Pt to complete Tug in under 10s Baseline: 11 Goal status: INITIAL     LONG TERM GOALS: Target date: 06/26/22  Pt will be indep with final Aquatic HEP Baseline: none Goal status: INITIAL  2.  Pt to meet stated FOTO goal of 59 Baseline: 44 Goal status: INITIAL  3.  Pt will increase core strength to 4/5 Baseline: 3+ Goal status:  INITIAL  4.  Pt to have improvement in lumbar ROM without pain at end range Baseline:  Goal status: INITIAL    PLAN: PT FREQUENCY: 1-2x/week  PT DURATION: 6 weeks  PLANNED INTERVENTIONS: Therapeutic exercises, Therapeutic activity, Neuromuscular re-education, Balance training, Gait training, Patient/Family education, Self Care, Joint mobilization, Joint manipulation, Stair training, Aquatic Therapy, Electrical stimulation, Spinal manipulation, Spinal mobilization, Cryotherapy, Moist heat, Taping, Traction, Ultrasound, and Manual therapy.  PLAN FOR NEXT SESSION: Aquatics for LB and LE stretching, trial water Pilates/ core strengthening  Rushie Chestnut) Barclay Lennox MPT 06/12/22 1:52 PM [facility] [address] [place], [place], [address] Phone: [phone]   Fax:  [phone]

## 2022-06-12 ENCOUNTER — Ambulatory Visit (HOSPITAL_BASED_OUTPATIENT_CLINIC_OR_DEPARTMENT_OTHER): Payer: Federal, State, Local not specified - PPO | Admitting: Physical Therapy

## 2022-06-12 ENCOUNTER — Encounter (HOSPITAL_BASED_OUTPATIENT_CLINIC_OR_DEPARTMENT_OTHER): Payer: Self-pay | Admitting: Physical Therapy

## 2022-06-12 DIAGNOSIS — M25572 Pain in left ankle and joints of left foot: Secondary | ICD-10-CM | POA: Diagnosis not present

## 2022-06-12 DIAGNOSIS — M545 Low back pain, unspecified: Secondary | ICD-10-CM | POA: Diagnosis not present

## 2022-06-12 DIAGNOSIS — M5459 Other low back pain: Secondary | ICD-10-CM | POA: Diagnosis not present

## 2022-06-12 DIAGNOSIS — G8929 Other chronic pain: Secondary | ICD-10-CM

## 2022-06-12 DIAGNOSIS — M6281 Muscle weakness (generalized): Secondary | ICD-10-CM | POA: Diagnosis not present

## 2022-06-12 DIAGNOSIS — M62830 Muscle spasm of back: Secondary | ICD-10-CM | POA: Diagnosis not present

## 2022-06-12 DIAGNOSIS — M25562 Pain in left knee: Secondary | ICD-10-CM | POA: Diagnosis not present

## 2022-06-14 ENCOUNTER — Ambulatory Visit (HOSPITAL_BASED_OUTPATIENT_CLINIC_OR_DEPARTMENT_OTHER): Payer: Federal, State, Local not specified - PPO | Admitting: Physical Therapy

## 2022-06-14 DIAGNOSIS — M1712 Unilateral primary osteoarthritis, left knee: Secondary | ICD-10-CM | POA: Diagnosis not present

## 2022-06-16 ENCOUNTER — Other Ambulatory Visit: Payer: Self-pay | Admitting: Nurse Practitioner

## 2022-06-19 ENCOUNTER — Encounter (HOSPITAL_BASED_OUTPATIENT_CLINIC_OR_DEPARTMENT_OTHER): Payer: Self-pay | Admitting: Physical Therapy

## 2022-06-19 ENCOUNTER — Ambulatory Visit (HOSPITAL_BASED_OUTPATIENT_CLINIC_OR_DEPARTMENT_OTHER): Payer: Federal, State, Local not specified - PPO | Admitting: Physical Therapy

## 2022-06-19 DIAGNOSIS — M5459 Other low back pain: Secondary | ICD-10-CM | POA: Diagnosis not present

## 2022-06-19 DIAGNOSIS — M545 Low back pain, unspecified: Secondary | ICD-10-CM

## 2022-06-19 DIAGNOSIS — M25572 Pain in left ankle and joints of left foot: Secondary | ICD-10-CM | POA: Diagnosis not present

## 2022-06-19 DIAGNOSIS — M6281 Muscle weakness (generalized): Secondary | ICD-10-CM | POA: Diagnosis not present

## 2022-06-19 DIAGNOSIS — M25562 Pain in left knee: Secondary | ICD-10-CM | POA: Diagnosis not present

## 2022-06-19 DIAGNOSIS — G8929 Other chronic pain: Secondary | ICD-10-CM | POA: Diagnosis not present

## 2022-06-19 DIAGNOSIS — M62830 Muscle spasm of back: Secondary | ICD-10-CM

## 2022-06-19 NOTE — Therapy (Signed)
OUTPATIENT PHYSICAL THERAPY THORACOLUMBAR TREATMENT NOTE   Patient Name: Maria Bryan MRN: [id number] DOB:1976/06/15, 46 y.o., female Today's Date: 06/19/2022   PT End of Session - 06/19/22 0911     Visit Number 6    Number of Visits 12    Date for PT Re-Evaluation 06/26/22    Authorization Type BC[employer]    Progress Note Due on Visit 10    PT Start Time 0905    PT Stop Time 0945    PT Time Calculation (min) 40 min    Activity Tolerance Patient tolerated treatment well    Behavior During Therapy [facility] for tasks assessed/performed               Past Medical History:  Diagnosis Date   Hypertension    Obesity    Past Surgical History:  Procedure Laterality Date   ACHILLES TENDON REPAIR Left 2011   CESAREAN SECTION     LAPAROSCOPIC GASTRIC SLEEVE RESECTION N/A 06/15/2018   Procedure: LAPAROSCOPIC GASTRIC SLEEVE RESECTION, UPPER ENDO, ERAS PATHWAY;  Surgeon: Greer Pickerel, MD;  Location: [facility];  Service: General;  Laterality: N/A;   TUBAL LIGATION     Patient Active Problem List   Diagnosis Date Noted   Other intervertebral disc degeneration, lumbar region 09/26/2021   OSA (obstructive sleep apnea) 06/15/2018   Chronic lumbosacral pain 06/15/2018   Essential hypertension 05/24/2018   Heart palpitations 01/06/2018   Snoring 01/06/2018   Morbid obesity ([place]) 01/06/2018    PCP: Glendale Chard, MD  REFERRING PROVIDER: Jean Rosenthal M  REFERRING DIAG:  M51.36 (ICD-10-CM) - Other intervertebral disc degeneration, lumbar region  M54.50,G89.29 (ICD-10-CM) - Chronic bilateral low back pain, unspecified whether sciatica present    Rationale for Evaluation and Treatment Rehabilitation  THERAPY DIAG:  Other low back pain  Muscle spasm of back  Muscle weakness (generalized)  Chronic right-sided low back pain without sciatica  ONSET DATE: 09/04/2021  SUBJECTIVE:                                                                                                                                                                                            SUBJECTIVE STATEMENT: "Stiff ness continues, knee alweays hurts   PERTINENT HISTORY:  HTN; obesity; bariatric surgery in 2019   PAIN:  Are you having pain? No  - just stiffness  NPRS scale: 0/10  Pain location: R lower back, Lt knee   Pain description: stiff  Aggravating factors: prolonged standing Relieving factors: sitting, lying down, ice, massage, stretching, tylenol   PRECAUTIONS: None  WEIGHT BEARING RESTRICTIONS No  FALLS:  Has patient fallen in last  6 months? No  LIVING ENVIRONMENT: Lives with: lives with their family Lives in: House/apartment Stairs: Yes: Internal: 16 steps; on right going up Has following equipment at home: None  OCCUPATION: attorney, sitting  PLOF: Independent  PATIENT GOALS increase strength, decrease pain, get back to exercise program   OBJECTIVE:   DIAGNOSTIC FINDINGS:  [place] includes significant lower lumbar degenerative changes with disc desiccation 5 mm AP diameter canal at L4-5 severe right moderate left lateral recess narrowing.  Moderate foraminal encroachment.  L5-S1 disc bulge right greater than left extraforaminal disc extension.  Central canal without stenosis.  Moderate left and moderate to severe right foraminal encroachment.   PATIENT SURVEYS:  FOTO 44 with goal of 92  SCREENING FOR RED FLAGS: Bowel or bladder incontinence: No   COGNITION:  Overall cognitive status: Within functional limits for tasks assessed     SENSATION: wfl  MUSCLE LENGTH: wfl  POSTURE: anterior pelvic tilt  PALPATION: TTP paraspinals right sided mid thoracic through lumbar  LUMBAR ROM:   Active  A/PROM  eval  Flexion fullP!  Extension full  Right lateral flexion 75%  Left lateral flexion 75%  Right rotation 10%P!  Left rotation 25%P!   (Blank rows = not tested)  LOWER EXTREMITY ROM:      Active  Right eval Left eval  Hip flexion wfl wfl  Hip extension    Hip abduction    Hip adduction    Hip internal rotation    Hip external rotation    Knee flexion wfl wfl  Knee extension    Ankle dorsiflexion    Ankle plantarflexion    Ankle inversion    Ankle eversion     (Blank rows = not tested)  LOWER EXTREMITY MMT:    MMT Right eval Left eval  Hip flexion 4 4  Hip extension    Hip abduction 5 5  Hip adduction    Hip internal rotation    Hip external rotation    Knee flexion    Knee extension    Ankle dorsiflexion    Ankle plantarflexion    Ankle inversion    Ankle eversion    Core strength 3+/5    LUMBAR SPECIAL TESTS:  Straight leg raise test: Negative, Slump test: Negative, and Stork standing: Negative Compression/distraction test:neg  FUNCTIONAL TESTS:  5 times sit to stand: 25 Timed up and go (TUG): 11  10/25: 5x STS: 12.91  TUG: 9.15  GAIT: Distance walked: 500 ft Assistive device utilized: None Level of assistance: Complete Independence Comments: gait wnl    TODAY'S TREATMENT  Pt seen for aquatic therapy today.  Treatment took place in water 3.25-4.5 ft in depth at the [place]. Temp of water was 92.  Pt entered/exited the pool via stairs independently with bilat rail.  Without support:  walking forward, backward walking and  side stepping Core engagement with yellow hand bupys at side forward amb x 4 width L stretch exaggerated, at steps 4x30s hold. %th reps with hip hiking R/L Holding rainbow hand buoys:  side lunge with squat with arm add/abdct x 4 widths Forward lunge x 4 widths Backward lunge x 10 Toe walking (knees extended for gastroc use then knees bent for soleus) x 4 widths Heel walking x 4 widths At stairs: gastroc stretch Forward step ups bottom step leading R/L x12. Cues for abdominal bracing throughout  Staggered stance with kick board row x 10 each leg forward- 2 sets Cues for increased  speed for  increased resistance as tolerated Kick board press (3-4 inches) hold for 10s x 6 Resisted lateral rotation using kick board R/L x10 ea. Cues and demonstration for execution. At wall:  hip abdct x 15 each; Hip ext x15 each ; squats x 15 cues for quad and glut set and range; heel raises  and TR  Straddling yellow noodle:  cycling with breast stroke arms; cc ski; jumping jack LEs -cues for form and speed - 2 rounds    Pt requires the buoyancy and hydrostatic pressure of water for support, and to offload joints by unweighting joint load by at least 50 % in navel deep water and by at least 75-80% in chest to neck deep water.  Viscosity of the water is needed for resistance of strengthening. Water current perturbations provides challenge to standing balance requiring increased core activation.    PATIENT EDUCATION:  Education details: aquatics progressions and modifications; HEP (land stretches) - issued another copy 10/13 Person educated: Patient Education method: Explanation Education comprehension: verbalized understanding   HOME EXERCISE PROGRAM: [email or web address] (last episode;  edited on 06/05/2022) Access Code: [email or web address] URL: [email or web address] Date: 06/05/2022 Prepared by: [facility] - Outpatient Rehab - [address]  Exercises - Supine Bridge  - 1 x daily - 7 x weekly - 1 sets - 10-15 reps - Standing Lumbar Extension  - 5 x daily - 7 x weekly - 1 sets - 2 reps - 5 seconds hold - Seated Hamstring Stretch  - 1 x daily - 7 x weekly - 1 sets - 2 reps - Seated Piriformis Stretch  - 1 x daily - 7 x weekly - 1 sets - 2 reps - 20 seconds hold - Quadricep Stretch with Chair and Counter Support  - 1 x daily - 7 x weekly - 1 sets - 2 reps - 20 seconds hold ASSESSMENT:  CLINICAL IMPRESSION: Pt reports stiffness after last session which resolved with movement.  Some gastroc and ant tib soreness as well with added exercises foucing in that area.  She is instructed throughout today to  maintain abd bracing for core engagement with all activities. VC and demonstration needed for improved core rotator engagement with resisted actvitiy. She tolerates all well without increase in any discomfort.  MRI results (as therapist read on pt phone not in epic yet) Chondromalacia patella, subtle breakdown of meniscus. Re-assess next visit      FROM EVAL: Patient is a 46 y.o. F who was seen today for physical therapy evaluation and treatment for Lumbar disc degeneration, some L4-5 stenosis with left leg pain. Pt was evaluated by Ortho at [address] [facility]) in January without pt follow up returning for scheduled visits. Similar to [facility] assessment , pt's Le and ROM and flexibility are [facility] . She does have decreased ROM and flexibility as well as strength in core limited by pain.  No increase in radicular sx experienced with testing today although she does report radicular sx in RLE earlier today stating they are intermittent. She will benefit aquatic skilled PT to improve functional mobility, increase ROM and strength, decrease pain. Pt may choose to transition on to land based therapy after 4-5 weeks aquatic.    OBJECTIVE IMPAIRMENTS decreased activity tolerance, decreased endurance, decreased ROM, decreased strength, obesity, and pain.   ACTIVITY LIMITATIONS bending, sitting, and standing  PARTICIPATION LIMITATIONS: cleaning, laundry, shopping, community activity, and yard work  PERSONAL FACTORS Past/current experiences and Time since onset of injury/illness/exacerbation are also affecting patient's functional outcome.  REHAB POTENTIAL: Good  CLINICAL DECISION MAKING: Stable/uncomplicated  EVALUATION COMPLEXITY: Low   GOALS: Goals reviewed with patient? Yes  SHORT TERM GOALS: Target date: 06/05/22  Patient to demonstrate independence in on land HEP  [place] on land program Goal status: INITIAL  2.  Pt will complete 5 X STS in under 15s to demonstrated  improved strength and balance Baseline: 25  10/25: 12.91 Goal status: met  3.  Pt to complete Tug in under 10s Baseline: 11s  10/25 9.15   Goal status: Met     LONG TERM GOALS: Target date: 06/26/22  Pt will be indep with final Aquatic HEP Baseline: none Goal status: INITIAL  2.  Pt to meet stated FOTO goal of 59 Baseline: 44 Goal status: INITIAL  3.  Pt will increase core strength to 4/5 Baseline: 3+ Goal status: INITIAL  4.  Pt to have improvement in lumbar ROM without pain at end range Baseline:  Goal status: INITIAL    PLAN: PT FREQUENCY: 1-2x/week  PT DURATION: 6 weeks  PLANNED INTERVENTIONS: Therapeutic exercises, Therapeutic activity, Neuromuscular re-education, Balance training, Gait training, Patient/Family education, Self Care, Joint mobilization, Joint manipulation, Stair training, Aquatic Therapy, Electrical stimulation, Spinal manipulation, Spinal mobilization, Cryotherapy, Moist heat, Taping, Traction, Ultrasound, and Manual therapy.  PLAN FOR NEXT SESSION: re-cert: assess knee/clarks test. Aquatics for LB and LE stretching, trial water Pilates/ core strengthening  Stanton Kidney Tharon Aquas) Sabatino Williard MPT 06/19/22 1:20 PM [facility] [address] [place], [place], [address] Phone: [phone]   Fax:  [phone]

## 2022-06-20 NOTE — Therapy (Signed)
OUTPATIENT PHYSICAL THERAPY THORACOLUMBAR TREATMENT NOTE   Patient Name: Maria Bryan MRN: [id number] DOB:1975-10-20, 46 y.o., female Today's Date: 06/23/2022   Progress Note/ Evaluation Knee Reporting Period 05/15/22 to 06/21/22  See note below for Objective Data and Assessment of Progress/Goals.     PT End of Session -     Visit Number 7   Number of Visits 23   Date for PT Re-Evaluation 08/16/22   Authorization Type    PT Start Time  900   PT Stop Time 945   PT Time Calculation (min) 45   Activity Tolerance Patient tolerated treatment well   Behavior During Therapy  WFL for tasks assessed/performed      Past Medical History:  Diagnosis Date   Hypertension    Obesity    Past Surgical History:  Procedure Laterality Date   ACHILLES TENDON REPAIR Left 2011   CESAREAN SECTION     LAPAROSCOPIC GASTRIC SLEEVE RESECTION N/A 06/15/2018   Procedure: LAPAROSCOPIC GASTRIC SLEEVE RESECTION, UPPER ENDO, ERAS PATHWAY;  Surgeon: Greer Pickerel, MD;  Location: [facility];  Service: General;  Laterality: N/A;   TUBAL LIGATION     Patient Active Problem List   Diagnosis Date Noted   Other intervertebral disc degeneration, lumbar region 09/26/2021   OSA (obstructive sleep apnea) 06/15/2018   Chronic lumbosacral pain 06/15/2018   Essential hypertension 05/24/2018   Heart palpitations 01/06/2018   Snoring 01/06/2018   Morbid obesity ([place]) 01/06/2018    PCP: Glendale Chard, MD  REFERRING PROVIDER: Jean Rosenthal M  REFERRING DIAG:  M51.36 (ICD-10-CM) - Other intervertebral disc degeneration, lumbar region  M54.50,G89.29 (ICD-10-CM) - Chronic bilateral low back pain, unspecified whether sciatica present    Rationale for Evaluation and Treatment Rehabilitation  THERAPY DIAG:  Acute pain of left knee - Plan: PT plan of care cert/re-cert  Pain in left ankle and joints of left foot - Plan: PT plan of care cert/re-cert  Other low back pain - Plan: PT plan of care  cert/re-cert  ONSET DATE: 62/70/3500  SUBJECTIVE:                                                                                                                                                                                           SUBJECTIVE STATEMENT: "Knee is sore, back as well but not too bad." "Not have as many episodes of radiating pain"    PERTINENT HISTORY:  HTN; obesity; bariatric surgery in 2019   PAIN:  Are you having pain? Yes and stiffness  NPRS scale: 3-4/10  Pain location: R lower back,    Pain description: stiff  Aggravating factors: prolonged  standing Relieving factors: sitting, lying down, ice, massage, stretching, tylenol   PAIN:  Are you having pain? Yes: NPRS scale: 5/10 Pain location: left knee Pain description: ache, sharp Aggravating factors: walking pivoting Relieving factors: sitting resting    PRECAUTIONS: None  WEIGHT BEARING RESTRICTIONS No  FALLS:  Has patient fallen in last 6 months? No  LIVING ENVIRONMENT: Lives with: lives with their family Lives in: House/apartment Stairs: Yes: Internal: 16 steps; on right going up Has following equipment at home: None  OCCUPATION: attorney, sitting  PLOF: Independent  PATIENT GOALS increase strength, decrease pain, get back to exercise program   OBJECTIVE:   DIAGNOSTIC FINDINGS:  Previous MRI scan [place] includes significant lower lumbar degenerative changes with disc desiccation 5 mm AP diameter canal at L4-5 severe right moderate left lateral recess narrowing.  Moderate foraminal encroachment.  L5-S1 disc bulge right greater than left extraforaminal disc extension.  Central canal without stenosis.  Moderate left and moderate to severe right foraminal encroachment.   MRI left knee: 10/23 results not in epic, as read on Pt's phone: MRI results (as therapist read on pt phone not in epic yet) Chondromalacia patella, subtle breakdown of meniscus.  PATIENT SURVEYS:   FOTO 44 with goal of 39  SCREENING FOR RED FLAGS: Bowel or bladder incontinence: No   COGNITION:  Overall cognitive status: Within functional limits for tasks assessed     SENSATION: wfl  MUSCLE LENGTH: wfl  POSTURE: anterior pelvic tilt  PALPATION: TTP paraspinals right sided mid thoracic through lumbar  LUMBAR ROM:   Active  A/PROM  eval AROM 10/27  Flexion fullP!   Extension full   Right lateral flexion 75%   Left lateral flexion 75%   Right rotation 10%P! 25% P!  Left rotation 25%P! 50%P!   (Blank rows = not tested)  LOWER EXTREMITY ROM:     Active  Right eval Left eval Left  Hip flexion wfl wfl   Hip extension     Hip abduction     Hip adduction     Hip internal rotation     Hip external rotation     Knee flexion wfl wfl 125d  Knee extension   0  Ankle dorsiflexion     Ankle plantarflexion     Ankle inversion     Ankle eversion      (Blank rows = not tested)  LOWER EXTREMITY MMT:    MMT Right eval Left eval Right / Left 06/21/22  Hip flexion 4 4   Hip extension     Hip abduction 5 5   Hip adduction     Hip internal rotation     Hip external rotation     Knee flexion   50 / 41.1  Knee extension   47.0 / 46.2  Ankle dorsiflexion     Ankle plantarflexion     Ankle inversion     Ankle eversion     Core strength 4-/5   KNEE TEST: Clarks test: positive left Compression/distraction test:neg  LUMBAR SPECIAL TESTS:  Straight leg raise test: Negative, Slump test: Negative, and Stork standing: Negative   FUNCTIONAL TESTS:  5 times sit to stand: 25 Timed up and go (TUG): 11  10/25: 5x STS: 12.91  TUG: 9.15  GAIT: Distance walked: 500 ft Assistive device utilized: None Level of assistance: Complete Independence Comments: gait wnl  10/25: pt with slight antalgic limp lle.    TODAY'S TREATMENT   Evaluation left knee Objective testing knee and  LB   Pt seen for aquatic therapy today.  Treatment took place in water 3.25-4.5  ft in depth at the [place]. Temp of water was 92.  Pt entered/exited the pool via stairs independently with bilat rail.  Without support:  walking forward, backward walking and  side stepping Core engagement with yellow hand buoys at side forward amb x 4 width L stretch exaggerated, at steps 4x30s hold. 5th reps with hip hiking R/L Forward step ups bottom step leading R/L x12. Cues for abdominal bracing throughout Straddling yellow noodle:  cycling with breast stroke arms; cc ski; jumping jack LEs -cues for form and speed - 2 rounds    Pt requires the buoyancy and hydrostatic pressure of water for support, and to offload joints by unweighting joint load by at least 50 % in navel deep water and by at least 75-80% in chest to neck deep water.  Viscosity of the water is needed for resistance of strengthening. Water current perturbations provides challenge to standing balance requiring increased core activation.    PATIENT EDUCATION:  Education details: aquatics progressions and modifications; HEP (land stretches) - issued another copy 10/13 Person educated: Patient Education method: Explanation Education comprehension: verbalized understanding   HOME EXERCISE PROGRAM: [email or web address] (last episode;  edited on 06/05/2022) Access Code: [email or web address] URL: [email or web address] Date: 06/05/2022 Prepared by: [facility] - Outpatient Rehab - [address]  Exercises - Supine Bridge  - 1 x daily - 7 x weekly - 1 sets - 10-15 reps - Standing Lumbar Extension  - 5 x daily - 7 x weekly - 1 sets - 2 reps - 5 seconds hold - Seated Hamstring Stretch  - 1 x daily - 7 x weekly - 1 sets - 2 reps - Seated Piriformis Stretch  - 1 x daily - 7 x weekly - 1 sets - 2 reps - 20 seconds hold - Quadricep Stretch with Chair and Counter Support  - 1 x daily - 7 x weekly - 1 sets - 2 reps - 20 seconds hold ASSESSMENT:  CLINICAL IMPRESSION: Left knee assessment completed.  She has a positive  Clark test with weakness in left quad .  Her patella glides slightly laterally with tight IT band and she describes pain in posterior patella area with movement which is consistent with dx of chondromalacia patella. She does have history of left achilles tendon repair and her left foot supinates slightly which may be a factor in knee dysfunction. She has difficulty climbing stairs in home to 2nd floor. She is encouraged to try a arch support to improve LLE alignment to see if this helps to decrease knee pain.   PN: Pt demonstrates improvement in LB reporting decrease in pain and radicular symptoms.  Her lumbar ROM has improved  as well as her objective testing as noted in chart above.  She has been consistently participating in therapy sessions and reports compliance with HEP.   We will continue seeing pt for both knee and le rehab.  Goals have been updated and added.        FROM EVAL: Patient is a 46 y.o. F who was seen today for physical therapy evaluation and treatment for Lumbar disc degeneration, some L4-5 stenosis with left leg pain. Pt was evaluated by Ortho at [address] [facility]) in January without pt follow up returning for scheduled visits. Similar to [facility] assessment , pt's Le and ROM and flexibility are [facility] . She does have decreased ROM and flexibility as well as strength  in core limited by pain.  No increase in radicular sx experienced with testing today although she does report radicular sx in RLE earlier today stating they are intermittent. She will benefit aquatic skilled PT to improve functional mobility, increase ROM and strength, decrease pain. Pt may choose to transition on to land based therapy after 4-5 weeks aquatic.    OBJECTIVE IMPAIRMENTS decreased activity tolerance, decreased endurance, decreased ROM, decreased strength, obesity, and pain.   ACTIVITY LIMITATIONS bending, sitting, and standing  PARTICIPATION LIMITATIONS: cleaning, laundry, shopping, community  activity, and yard work  PERSONAL FACTORS Past/current experiences and Time since onset of injury/illness/exacerbation are also affecting patient's functional outcome.   REHAB POTENTIAL: Good  CLINICAL DECISION MAKING: Stable/uncomplicated  EVALUATION COMPLEXITY: Low   GOALS: Goals reviewed with patient? Yes  SHORT TERM GOALS: Target date: 06/05/22  Patient to demonstrate independence in on land HEP  [place] on land program Goal status: Met  2.  Pt will complete 5 X STS in under 15s to demonstrated improved strength and balance Baseline: 25  10/25: 12.91 Goal status: met  3.  Pt to complete Tug in under 10s Baseline: 11s  10/25 9.15   Goal status: Met     LONG TERM GOALS: Target date: 112/22/23  Pt will be indep with final Aquatic HEP Baseline: none Goal status: inital  2.  Pt to meet stated FOTO goal of 59 Baseline: 44 Goal status: ongoing  3.  Pt will increase core strength to 4/5 Baseline: 3+ Goal status: Ongoing  4.  Pt to have improvement in lumbar ROM without pain at end range Baseline:  Goal status: ongoing  5. Pt to have decrease in Left knee pain to <2/10 to demonstrate improving condition    Baseline: 5/10   Goal Status: New   6. Pt will have improvement in left knee strength equal to contralateral side to demonstrate improving strength for increased toleration to walking   Baseline: see chart   Goal Status: New   7. Pt will be able to climb stairs using alternation pattern full flight of steps.   Baseline: hurts, using both alternating and step to   PLAN: PT FREQUENCY: 1-2x/week  PT DURATION: 6 weeks  PLANNED INTERVENTIONS: Therapeutic exercises, Therapeutic activity, Neuromuscular re-education, Balance training, Gait training, Patient/Family education, Self Care, Joint mobilization, Joint manipulation, Stair training, Aquatic Therapy, Electrical stimulation, Spinal manipulation, Spinal mobilization, Cryotherapy, Moist heat,  Taping, Traction, Ultrasound, and Manual therapy.  PLAN FOR NEXT SESSION: Knee strengthening, Aquatics for LB and LE stretching, trial water Pilates/ core strengthening  Stanton Kidney Tharon Aquas) Caliya Narine MPT 06/23/22 5:22 PM [facility] [address] [place], [place], [address] Phone: [phone]   Fax:  [phone]

## 2022-06-21 ENCOUNTER — Encounter (HOSPITAL_BASED_OUTPATIENT_CLINIC_OR_DEPARTMENT_OTHER): Payer: Self-pay | Admitting: Physical Therapy

## 2022-06-21 ENCOUNTER — Ambulatory Visit (HOSPITAL_BASED_OUTPATIENT_CLINIC_OR_DEPARTMENT_OTHER): Payer: Federal, State, Local not specified - PPO | Admitting: Physical Therapy

## 2022-06-21 DIAGNOSIS — M545 Low back pain, unspecified: Secondary | ICD-10-CM | POA: Diagnosis not present

## 2022-06-21 DIAGNOSIS — M25562 Pain in left knee: Secondary | ICD-10-CM

## 2022-06-21 DIAGNOSIS — M62830 Muscle spasm of back: Secondary | ICD-10-CM | POA: Diagnosis not present

## 2022-06-21 DIAGNOSIS — M25572 Pain in left ankle and joints of left foot: Secondary | ICD-10-CM | POA: Diagnosis not present

## 2022-06-21 DIAGNOSIS — G8929 Other chronic pain: Secondary | ICD-10-CM | POA: Diagnosis not present

## 2022-06-21 DIAGNOSIS — M5459 Other low back pain: Secondary | ICD-10-CM

## 2022-06-21 DIAGNOSIS — M6281 Muscle weakness (generalized): Secondary | ICD-10-CM | POA: Diagnosis not present

## 2022-06-24 DIAGNOSIS — M1712 Unilateral primary osteoarthritis, left knee: Secondary | ICD-10-CM | POA: Diagnosis not present

## 2022-06-25 ENCOUNTER — Encounter: Payer: Federal, State, Local not specified - PPO | Admitting: Nurse Practitioner

## 2022-06-25 NOTE — Patient Instructions (Signed)
Hypertension, Adult ?Hypertension is another name for high blood pressure. High blood pressure forces your heart to work harder to pump blood. This can cause problems over time. ?There are two numbers in a blood pressure reading. There is a top number (systolic) over a bottom number (diastolic). It is best to have a blood pressure that is below 120/80. ?What are the causes? ?The cause of this condition is not known. Some other conditions can lead to high blood pressure. ?What increases the risk? ?Some lifestyle factors can make you more likely to develop high blood pressure: ?Smoking. ?Not getting enough exercise or physical activity. ?Being overweight. ?Having too much fat, sugar, calories, or salt (sodium) in your diet. ?Drinking too much alcohol. ?Other risk factors include: ?Having any of these conditions: ?Heart disease. ?Diabetes. ?High cholesterol. ?Kidney disease. ?Obstructive sleep apnea. ?Having a family history of high blood pressure and high cholesterol. ?Age. The risk increases with age. ?Stress. ?What are the signs or symptoms? ?High blood pressure may not cause symptoms. Very high blood pressure (hypertensive crisis) may cause: ?Headache. ?Fast or uneven heartbeats (palpitations). ?Shortness of breath. ?Nosebleed. ?Vomiting or feeling like you may vomit (nauseous). ?Changes in how you see. ?Very bad chest pain. ?Feeling dizzy. ?Seizures. ?How is this treated? ?This condition is treated by making healthy lifestyle changes, such as: ?Eating healthy foods. ?Exercising more. ?Drinking less alcohol. ?Your doctor may prescribe medicine if lifestyle changes do not help enough and if: ?Your top number is above 130. ?Your bottom number is above 80. ?Your personal target blood pressure may vary. ?Follow these instructions at home: ?Eating and drinking ? ?If told, follow the DASH eating plan. To follow this plan: ?Fill one half of your plate at each meal with fruits and vegetables. ?Fill one fourth of your plate  at each meal with whole grains. Whole grains include whole-wheat pasta, brown rice, and whole-grain bread. ?Eat or drink low-fat dairy products, such as skim milk or low-fat yogurt. ?Fill one fourth of your plate at each meal with low-fat (lean) proteins. Low-fat proteins include fish, chicken without skin, eggs, beans, and tofu. ?Avoid fatty meat, cured and processed meat, or chicken with skin. ?Avoid pre-made or processed food. ?Limit the amount of salt in your diet to less than 1,500 mg each day. ?Do not drink alcohol if: ?Your doctor tells you not to drink. ?You are pregnant, may be pregnant, or are planning to become pregnant. ?If you drink alcohol: ?Limit how much you have to: ?0-1 drink a day for women. ?0-2 drinks a day for men. ?Know how much alcohol is in your drink. In the [place] one drink equals one 12 oz bottle of beer (355 mL), one 5 oz glass of wine (148 mL), or one 1? oz glass of hard liquor (44 mL). ?Lifestyle ? ?Work with your doctor to stay at a healthy weight or to lose weight. Ask your doctor what the best weight is for you. ?Get at least 30 minutes of exercise that causes your heart to beat faster (aerobic exercise) most days of the week. This may include walking, swimming, or biking. ?Get at least 30 minutes of exercise that strengthens your muscles (resistance exercise) at least 3 days a week. This may include lifting weights or doing Pilates. ?Do not smoke or use any products that contain nicotine or tobacco. If you need help quitting, ask your doctor. ?Check your blood pressure at home as told by your doctor. ?Keep all follow-up visits. ?Medicines ?Take over-the-counter and prescription medicines   only as told by your doctor. Follow directions carefully. ?Do not skip doses of blood pressure medicine. The medicine does not work as well if you skip doses. Skipping doses also puts you at risk for problems. ?Ask your doctor about side effects or reactions to medicines that you should watch  for. ?Contact a doctor if: ?You think you are having a reaction to the medicine you are taking. ?You have headaches that keep coming back. ?You feel dizzy. ?You have swelling in your ankles. ?You have trouble with your vision. ?Get help right away if: ?You get a very bad headache. ?You start to feel mixed up (confused). ?You feel weak or numb. ?You feel faint. ?You have very bad pain in your: ?Chest. ?Belly (abdomen). ?You vomit more than once. ?You have trouble breathing. ?These symptoms may be an emergency. Get help right away. Call 911. ?Do not wait to see if the symptoms will go away. ?Do not drive yourself to the hospital. ?Summary ?Hypertension is another name for high blood pressure. ?High blood pressure forces your heart to work harder to pump blood. ?For most people, a normal blood pressure is less than 120/80. ?Making healthy choices can help lower blood pressure. If your blood pressure does not get lower with healthy choices, you may need to take medicine. ?This information is not intended to replace advice given to you by your health care provider. Make sure you discuss any questions you have with your health care provider. ?Document Revised: 05/31/2021 Document Reviewed: 05/31/2021 ?Elsevier Patient Education ? 2023 [employer] ? ?

## 2022-06-25 NOTE — Progress Notes (Signed)
Not seen

## 2022-06-26 ENCOUNTER — Telehealth (HOSPITAL_BASED_OUTPATIENT_CLINIC_OR_DEPARTMENT_OTHER): Payer: Self-pay | Admitting: Physical Therapy

## 2022-06-26 ENCOUNTER — Ambulatory Visit (HOSPITAL_BASED_OUTPATIENT_CLINIC_OR_DEPARTMENT_OTHER): Payer: Federal, State, Local not specified - PPO | Attending: Orthopaedic Surgery | Admitting: Physical Therapy

## 2022-06-26 DIAGNOSIS — M6281 Muscle weakness (generalized): Secondary | ICD-10-CM | POA: Insufficient documentation

## 2022-06-26 DIAGNOSIS — M62830 Muscle spasm of back: Secondary | ICD-10-CM | POA: Insufficient documentation

## 2022-06-26 DIAGNOSIS — M5459 Other low back pain: Secondary | ICD-10-CM | POA: Insufficient documentation

## 2022-06-26 DIAGNOSIS — M25562 Pain in left knee: Secondary | ICD-10-CM | POA: Insufficient documentation

## 2022-06-26 DIAGNOSIS — G8929 Other chronic pain: Secondary | ICD-10-CM | POA: Insufficient documentation

## 2022-06-26 DIAGNOSIS — M25572 Pain in left ankle and joints of left foot: Secondary | ICD-10-CM | POA: Insufficient documentation

## 2022-06-26 DIAGNOSIS — M545 Low back pain, unspecified: Secondary | ICD-10-CM | POA: Insufficient documentation

## 2022-06-26 NOTE — Telephone Encounter (Signed)
LVM regarding missed 10:15 appt today. Advised this is her last appointment scheduled and to call back should she want to reschedule her land-based re assessment.  Bailey Kolbe C. Charlot Gouin PT, DPT 06/26/22 10:45 AM

## 2022-06-26 NOTE — Therapy (Incomplete)
OUTPATIENT PHYSICAL THERAPY THORACOLUMBAR TREATMENT NOTE   Patient Name: Maria Bryan MRN: [id number] DOB:11/07/1975, 46 y.o., female Today's Date: 06/26/2022       Past Medical History:  Diagnosis Date   Hypertension    Obesity    Past Surgical History:  Procedure Laterality Date   ACHILLES TENDON REPAIR Left 2011   CESAREAN SECTION     LAPAROSCOPIC GASTRIC SLEEVE RESECTION N/A 06/15/2018   Procedure: LAPAROSCOPIC GASTRIC SLEEVE RESECTION, UPPER ENDO, ERAS PATHWAY;  Surgeon: Greer Pickerel, MD;  Location: [facility];  Service: General;  Laterality: N/A;   TUBAL LIGATION     Patient Active Problem List   Diagnosis Date Noted   Other intervertebral disc degeneration, lumbar region 09/26/2021   OSA (obstructive sleep apnea) 06/15/2018   Chronic lumbosacral pain 06/15/2018   Essential hypertension 05/24/2018   Heart palpitations 01/06/2018   Snoring 01/06/2018   Morbid obesity ([place]) 01/06/2018    PCP: Glendale Chard, MD  REFERRING PROVIDER: Jean Rosenthal M  REFERRING DIAG:  M51.36 (ICD-10-CM) - Other intervertebral disc degeneration, lumbar region  M54.50,G89.29 (ICD-10-CM) - Chronic bilateral low back pain, unspecified whether sciatica present    Rationale for Evaluation and Treatment Rehabilitation  THERAPY DIAG:  No diagnosis found.  ONSET DATE: 09/04/2021  SUBJECTIVE:                                                                                                                                                                                           SUBJECTIVE STATEMENT: "Knee is sore, back as well but not too bad." "Not have as many episodes of radiating pain"    PERTINENT HISTORY:  HTN; obesity; bariatric surgery in 2019   PAIN:  Are you having pain? Yes and stiffness  NPRS scale: 3-4/10  Pain location: R lower back,    Pain description: stiff  Aggravating factors: prolonged standing Relieving factors: sitting, lying down, ice,  massage, stretching, tylenol   PAIN:  Are you having pain? Yes: NPRS scale: 5/10 Pain location: left knee Pain description: ache, sharp Aggravating factors: walking pivoting Relieving factors: sitting resting    PRECAUTIONS: None  WEIGHT BEARING RESTRICTIONS No  FALLS:  Has patient fallen in last 6 months? No  LIVING ENVIRONMENT: Lives with: lives with their family Lives in: House/apartment Stairs: Yes: Internal: 16 steps; on right going up Has following equipment at home: None  OCCUPATION: attorney, sitting  PLOF: Independent  PATIENT GOALS increase strength, decrease pain, get back to exercise program   OBJECTIVE:   DIAGNOSTIC FINDINGS:  [place] includes significant lower lumbar degenerative changes with disc desiccation 5 mm AP  diameter canal at L4-5 severe right moderate left lateral recess narrowing.  Moderate foraminal encroachment.  L5-S1 disc bulge right greater than left extraforaminal disc extension.  Central canal without stenosis.  Moderate left and moderate to severe right foraminal encroachment.   MRI left knee: 10/23 results not in epic, as read on Pt's phone: MRI results (as therapist read on pt phone not in epic yet) Chondromalacia patella, subtle breakdown of meniscus.  PATIENT SURVEYS:  FOTO 44 with goal of 53  SCREENING FOR RED FLAGS: Bowel or bladder incontinence: No   COGNITION:  Overall cognitive status: Within functional limits for tasks assessed     SENSATION: wfl  MUSCLE LENGTH: wfl  POSTURE: anterior pelvic tilt  PALPATION: TTP paraspinals right sided mid thoracic through lumbar  LUMBAR ROM:   Active  A/PROM  eval AROM 10/27  Flexion fullP!   Extension full   Right lateral flexion 75%   Left lateral flexion 75%   Right rotation 10%P! 25% P!  Left rotation 25%P! 50%P!   (Blank rows = not tested)  LOWER EXTREMITY ROM:     Active  Right eval Left eval Left  Hip flexion wfl wfl    Hip extension     Hip abduction     Hip adduction     Hip internal rotation     Hip external rotation     Knee flexion wfl wfl 125d  Knee extension   0  Ankle dorsiflexion     Ankle plantarflexion     Ankle inversion     Ankle eversion      (Blank rows = not tested)  LOWER EXTREMITY MMT:    MMT Right eval Left eval Right / Left 06/21/22  Hip flexion 4 4   Hip extension     Hip abduction 5 5   Hip adduction     Hip internal rotation     Hip external rotation     Knee flexion   50 / 41.1  Knee extension   47.0 / 46.2  Ankle dorsiflexion     Ankle plantarflexion     Ankle inversion     Ankle eversion     Core strength 4-/5   KNEE TEST: Clarks test: positive left Compression/distraction test:neg  LUMBAR SPECIAL TESTS:  Straight leg raise test: Negative, Slump test: Negative, and Stork standing: Negative   FUNCTIONAL TESTS:  5 times sit to stand: 25 Timed up and go (TUG): 11  10/25: 5x STS: 12.91  TUG: 9.15  GAIT: Distance walked: 500 ft Assistive device utilized: None Level of assistance: Complete Independence Comments: gait wnl  10/25: pt with slight antalgic limp lle.    TODAY'S TREATMENT   Evaluation left knee Objective testing knee and LB   Pt seen for aquatic therapy today.  Treatment took place in water 3.25-4.5 ft in depth at the [place] [facility]. Temp of water was 92.  Pt entered/exited the pool via stairs independently with bilat rail.  Without support:  walking forward, backward walking and  side stepping Core engagement with yellow hand buoys at side forward amb x 4 width L stretch exaggerated, at steps 4x30s hold. 5th reps with hip hiking R/L Forward step ups bottom step leading R/L x12. Cues for abdominal bracing throughout Straddling yellow noodle:  cycling with breast stroke arms; cc ski; jumping jack LEs -cues for form and speed - 2 rounds    Pt requires the buoyancy and hydrostatic pressure of water for support, and to  offload  joints by unweighting joint load by at least 50 % in navel deep water and by at least 75-80% in chest to neck deep water.  Viscosity of the water is needed for resistance of strengthening. Water current perturbations provides challenge to standing balance requiring increased core activation.    PATIENT EDUCATION:  Education details: aquatics progressions and modifications; HEP (land stretches) - issued another copy 10/13 Person educated: Patient Education method: Explanation Education comprehension: verbalized understanding   HOME EXERCISE PROGRAM: [email or web address] (last episode;  edited on 06/05/2022) Access Code: [email or web address] URL: [email or web address] Date: 06/05/2022 Prepared by: [facility] - [address]  Exercises - Supine Bridge  - 1 x daily - 7 x weekly - 1 sets - 10-15 reps - Standing Lumbar Extension  - 5 x daily - 7 x weekly - 1 sets - 2 reps - 5 seconds hold - Seated Hamstring Stretch  - 1 x daily - 7 x weekly - 1 sets - 2 reps - Seated Piriformis Stretch  - 1 x daily - 7 x weekly - 1 sets - 2 reps - 20 seconds hold - Quadricep Stretch with Chair and Counter Support  - 1 x daily - 7 x weekly - 1 sets - 2 reps - 20 seconds hold ASSESSMENT:  CLINICAL IMPRESSION: Left knee assessment completed.  She has a positive Clark test with weakness in left quad .  Her patella glides slightly laterally with tight IT band and she describes pain in posterior patella area with movement which is consistent with dx of chondromalacia patella. She does have history of left achilles tendon repair and her left foot supinates slightly which may be a factor in knee dysfunction. She has difficulty climbing stairs in home to 2nd floor. She is encouraged to try a arch support to improve LLE alignment to see if this helps to decrease knee pain.   PN: Pt demonstrates improvement in LB reporting decrease in pain and radicular symptoms.  Her lumbar ROM has improved  as well as her  objective testing as noted in chart above.  She has been consistently participating in therapy sessions and reports compliance with HEP.   We will continue seeing pt for both knee and le rehab.  Goals have been updated and added.        FROM EVAL: Patient is a 46 y.o. F who was seen today for physical therapy evaluation and treatment for Lumbar disc degeneration, some L4-5 stenosis with left leg pain. Pt was evaluated by Ortho at [address] [facility]) in January without pt follow up returning for scheduled visits. Similar to [facility] assessment , pt's Le and ROM and flexibility are [facility] . She does have decreased ROM and flexibility as well as strength in core limited by pain.  No increase in radicular sx experienced with testing today although she does report radicular sx in RLE earlier today stating they are intermittent. She will benefit aquatic skilled PT to improve functional mobility, increase ROM and strength, decrease pain. Pt may choose to transition on to land based therapy after 4-5 weeks aquatic.    OBJECTIVE IMPAIRMENTS decreased activity tolerance, decreased endurance, decreased ROM, decreased strength, obesity, and pain.   ACTIVITY LIMITATIONS bending, sitting, and standing  PARTICIPATION LIMITATIONS: cleaning, laundry, shopping, community activity, and yard work  PERSONAL FACTORS Past/current experiences and Time since onset of injury/illness/exacerbation are also affecting patient's functional outcome.   REHAB POTENTIAL: Good  CLINICAL DECISION MAKING: Stable/uncomplicated  EVALUATION COMPLEXITY: Low   GOALS: Goals reviewed  with patient? Yes  SHORT TERM GOALS: Target date: 06/05/22  Patient to demonstrate independence in on land HEP  [place] on land program Goal status: Met  2.  Pt will complete 5 X STS in under 15s to demonstrated improved strength and balance Baseline: 25  10/25: 12.91 Goal status: met  3.  Pt to complete Tug in under  10s Baseline: 11s  10/25 9.15   Goal status: Met     LONG TERM GOALS: Target date: 112/22/23  Pt will be indep with final Aquatic HEP Baseline: none Goal status: inital  2.  Pt to meet stated FOTO goal of 59 Baseline: 44 Goal status: ongoing  3.  Pt will increase core strength to 4/5 Baseline: 3+ Goal status: Ongoing  4.  Pt to have improvement in lumbar ROM without pain at end range Baseline:  Goal status: ongoing  5. Pt to have decrease in Left knee pain to <2/10 to demonstrate improving condition    Baseline: 5/10   Goal Status: New   6. Pt will have improvement in left knee strength equal to contralateral side to demonstrate improving strength for increased toleration to walking   Baseline: see chart   Goal Status: New   7. Pt will be able to climb stairs using alternation pattern full flight of steps.   Baseline: hurts, using both alternating and step to   PLAN: PT FREQUENCY: 1-2x/week  PT DURATION: 6 weeks  PLANNED INTERVENTIONS: Therapeutic exercises, Therapeutic activity, Neuromuscular re-education, Balance training, Gait training, Patient/Family education, Self Care, Joint mobilization, Joint manipulation, Stair training, Aquatic Therapy, Electrical stimulation, Spinal manipulation, Spinal mobilization, Cryotherapy, Moist heat, Taping, Traction, Ultrasound, and Manual therapy.  PLAN FOR NEXT SESSION: Knee strengthening, Aquatics for LB and LE stretching, trial water Pilates/ core strengthening  Alyona Romack C. Laikyn Gewirtz PT, DPT 06/26/22 10:20 AM

## 2022-07-21 DIAGNOSIS — M25511 Pain in right shoulder: Secondary | ICD-10-CM | POA: Diagnosis not present

## 2022-07-28 NOTE — Therapy (Signed)
OUTPATIENT PHYSICAL THERAPY THORACOLUMBAR TREATMENT NOTE   Patient Name: Maria Bryan MRN: [id number] DOB:1975-10-20, 46 y.o., female Today's Date: 06/23/2022   Progress Note/ Evaluation Knee Reporting Period 05/15/22 to 06/21/22  See note below for Objective Data and Assessment of Progress/Goals.     PT End of Session -     Visit Number 7   Number of Visits 23   Date for PT Re-Evaluation 08/16/22   Authorization Type    PT Start Time  900   PT Stop Time 945   PT Time Calculation (min) 45   Activity Tolerance Patient tolerated treatment well   Behavior During Therapy  WFL for tasks assessed/performed      Past Medical History:  Diagnosis Date   Hypertension    Obesity    Past Surgical History:  Procedure Laterality Date   ACHILLES TENDON REPAIR Left 2011   CESAREAN SECTION     LAPAROSCOPIC GASTRIC SLEEVE RESECTION N/A 06/15/2018   Procedure: LAPAROSCOPIC GASTRIC SLEEVE RESECTION, UPPER ENDO, ERAS PATHWAY;  Surgeon: Greer Pickerel, MD;  Location: [facility];  Service: General;  Laterality: N/A;   TUBAL LIGATION     Patient Active Problem List   Diagnosis Date Noted   Other intervertebral disc degeneration, lumbar region 09/26/2021   OSA (obstructive sleep apnea) 06/15/2018   Chronic lumbosacral pain 06/15/2018   Essential hypertension 05/24/2018   Heart palpitations 01/06/2018   Snoring 01/06/2018   Morbid obesity ([place]) 01/06/2018    PCP: Glendale Chard, MD  REFERRING PROVIDER: Jean Rosenthal M  REFERRING DIAG:  M51.36 (ICD-10-CM) - Other intervertebral disc degeneration, lumbar region  M54.50,G89.29 (ICD-10-CM) - Chronic bilateral low back pain, unspecified whether sciatica present    Rationale for Evaluation and Treatment Rehabilitation  THERAPY DIAG:  Acute pain of left knee - Plan: PT plan of care cert/re-cert  Pain in left ankle and joints of left foot - Plan: PT plan of care cert/re-cert  Other low back pain - Plan: PT plan of care  cert/re-cert  ONSET DATE: 62/70/3500  SUBJECTIVE:                                                                                                                                                                                           SUBJECTIVE STATEMENT: "Knee is sore, back as well but not too bad." "Not have as many episodes of radiating pain"    PERTINENT HISTORY:  HTN; obesity; bariatric surgery in 2019   PAIN:  Are you having pain? Yes and stiffness  NPRS scale: 3-4/10  Pain location: R lower back,    Pain description: stiff  Aggravating factors: prolonged  standing Relieving factors: sitting, lying down, ice, massage, stretching, tylenol   PAIN:  Are you having pain? Yes: NPRS scale: 5/10 Pain location: left knee Pain description: ache, sharp Aggravating factors: walking pivoting Relieving factors: sitting resting    PRECAUTIONS: None  WEIGHT BEARING RESTRICTIONS No  FALLS:  Has patient fallen in last 6 months? No  LIVING ENVIRONMENT: Lives with: lives with their family Lives in: House/apartment Stairs: Yes: Internal: 16 steps; on right going up Has following equipment at home: None  OCCUPATION: attorney, sitting  PLOF: Independent  PATIENT GOALS increase strength, decrease pain, get back to exercise program   OBJECTIVE:   DIAGNOSTIC FINDINGS:  [place] includes significant lower lumbar degenerative changes with disc desiccation 5 mm AP diameter canal at L4-5 severe right moderate left lateral recess narrowing.  Moderate foraminal encroachment.  L5-S1 disc bulge right greater than left extraforaminal disc extension.  Central canal without stenosis.  Moderate left and moderate to severe right foraminal encroachment.   MRI left knee: 10/23 results not in epic, as read on Pt's phone: MRI results (as therapist read on pt phone not in epic yet) Chondromalacia patella, subtle breakdown of meniscus.  PATIENT SURVEYS:   FOTO 44 with goal of 39  SCREENING FOR RED FLAGS: Bowel or bladder incontinence: No   COGNITION:  Overall cognitive status: Within functional limits for tasks assessed     SENSATION: wfl  MUSCLE LENGTH: wfl  POSTURE: anterior pelvic tilt  PALPATION: TTP paraspinals right sided mid thoracic through lumbar  LUMBAR ROM:   Active  A/PROM  eval AROM 10/27  Flexion fullP!   Extension full   Right lateral flexion 75%   Left lateral flexion 75%   Right rotation 10%P! 25% P!  Left rotation 25%P! 50%P!   (Blank rows = not tested)  LOWER EXTREMITY ROM:     Active  Right eval Left eval Left  Hip flexion wfl wfl   Hip extension     Hip abduction     Hip adduction     Hip internal rotation     Hip external rotation     Knee flexion wfl wfl 125d  Knee extension   0  Ankle dorsiflexion     Ankle plantarflexion     Ankle inversion     Ankle eversion      (Blank rows = not tested)  LOWER EXTREMITY MMT:    MMT Right eval Left eval Right / Left 06/21/22  Hip flexion 4 4   Hip extension     Hip abduction 5 5   Hip adduction     Hip internal rotation     Hip external rotation     Knee flexion   50 / 41.1  Knee extension   47.0 / 46.2  Ankle dorsiflexion     Ankle plantarflexion     Ankle inversion     Ankle eversion     Core strength 4-/5   KNEE TEST: Clarks test: positive left Compression/distraction test:neg  LUMBAR SPECIAL TESTS:  Straight leg raise test: Negative, Slump test: Negative, and Stork standing: Negative   FUNCTIONAL TESTS:  5 times sit to stand: 25 Timed up and go (TUG): 11  10/25: 5x STS: 12.91  TUG: 9.15  GAIT: Distance walked: 500 ft Assistive device utilized: None Level of assistance: Complete Independence Comments: gait wnl  10/25: pt with slight antalgic limp lle.    TODAY'S TREATMENT   Evaluation left knee Objective testing knee and  LB   Pt seen for aquatic therapy today.  Treatment took place in water 3.25-4.5  ft in depth at the [facility]. Temp of water was 92.  Pt entered/exited the pool via stairs independently with bilat rail.  Without support:  walking forward, backward walking and  side stepping Core engagement with yellow hand buoys at side forward amb x 4 width L stretch exaggerated, at steps 4x30s hold. 5th reps with hip hiking R/L Forward step ups bottom step leading R/L x12. Cues for abdominal bracing throughout Straddling yellow noodle:  cycling with breast stroke arms; cc ski; jumping jack LEs -cues for form and speed - 2 rounds    Pt requires the buoyancy and hydrostatic pressure of water for support, and to offload joints by unweighting joint load by at least 50 % in navel deep water and by at least 75-80% in chest to neck deep water.  Viscosity of the water is needed for resistance of strengthening. Water current perturbations provides challenge to standing balance requiring increased core activation.    PATIENT EDUCATION:  Education details: aquatics progressions and modifications; HEP (land stretches) - issued another copy 10/13 Person educated: Patient Education method: Explanation Education comprehension: verbalized understanding   HOME EXERCISE PROGRAM: [email or web address] (last episode;  edited on 06/05/2022) Access Code: [email or web address] URL: [email or web address] Date: 06/05/2022 Prepared by: [facility] [address]  Exercises - Supine Bridge  - 1 x daily - 7 x weekly - 1 sets - 10-15 reps - Standing Lumbar Extension  - 5 x daily - 7 x weekly - 1 sets - 2 reps - 5 seconds hold - Seated Hamstring Stretch  - 1 x daily - 7 x weekly - 1 sets - 2 reps - Seated Piriformis Stretch  - 1 x daily - 7 x weekly - 1 sets - 2 reps - 20 seconds hold - Quadricep Stretch with Chair and Counter Support  - 1 x daily - 7 x weekly - 1 sets - 2 reps - 20 seconds hold ASSESSMENT:  CLINICAL IMPRESSION: Left knee assessment completed.  She has a positive  Clark test with weakness in left quad .  Her patella glides slightly laterally with tight IT band and she describes pain in posterior patella area with movement which is consistent with dx of chondromalacia patella. She does have history of left achilles tendon repair and her left foot supinates slightly which may be a factor in knee dysfunction. She has difficulty climbing stairs in home to 2nd floor. She is encouraged to try a arch support to improve LLE alignment to see if this helps to decrease knee pain.   PN: Pt demonstrates improvement in LB reporting decrease in pain and radicular symptoms.  Her lumbar ROM has improved  as well as her objective testing as noted in chart above.  She has been consistently participating in therapy sessions and reports compliance with HEP.   We will continue seeing pt for both knee and le rehab.  Goals have been updated and added.        FROM EVAL: Patient is a 46 y.o. F who was seen today for physical therapy evaluation and treatment for Lumbar disc degeneration, some L4-5 stenosis with left leg pain. Pt was evaluated by Ortho at [address] [facility]) in January without pt follow up returning for scheduled visits. Similar to [facility] assessment , pt's Le and ROM and flexibility are [facility] . She does have decreased ROM and flexibility as well as strength  in core limited by pain.  No increase in radicular sx experienced with testing today although she does report radicular sx in RLE earlier today stating they are intermittent. She will benefit aquatic skilled PT to improve functional mobility, increase ROM and strength, decrease pain. Pt may choose to transition on to land based therapy after 4-5 weeks aquatic.    OBJECTIVE IMPAIRMENTS decreased activity tolerance, decreased endurance, decreased ROM, decreased strength, obesity, and pain.   ACTIVITY LIMITATIONS bending, sitting, and standing  PARTICIPATION LIMITATIONS: cleaning, laundry, shopping, community  activity, and yard work  PERSONAL FACTORS Past/current experiences and Time since onset of injury/illness/exacerbation are also affecting patient's functional outcome.   REHAB POTENTIAL: Good  CLINICAL DECISION MAKING: Stable/uncomplicated  EVALUATION COMPLEXITY: Low   GOALS: Goals reviewed with patient? Yes  SHORT TERM GOALS: Target date: 06/05/22  Patient to demonstrate independence in on land HEP  [place] on land program Goal status: Met  2.  Pt will complete 5 X STS in under 15s to demonstrated improved strength and balance Baseline: 25  10/25: 12.91 Goal status: met  3.  Pt to complete Tug in under 10s Baseline: 11s  10/25 9.15   Goal status: Met     LONG TERM GOALS: Target date: 112/22/23  Pt will be indep with final Aquatic HEP Baseline: none Goal status: inital  2.  Pt to meet stated FOTO goal of 59 Baseline: 44 Goal status: ongoing  3.  Pt will increase core strength to 4/5 Baseline: 3+ Goal status: Ongoing  4.  Pt to have improvement in lumbar ROM without pain at end range Baseline:  Goal status: ongoing  5. Pt to have decrease in Left knee pain to <2/10 to demonstrate improving condition    Baseline: 5/10   Goal Status: New   6. Pt will have improvement in left knee strength equal to contralateral side to demonstrate improving strength for increased toleration to walking   Baseline: see chart   Goal Status: New   7. Pt will be able to climb stairs using alternation pattern full flight of steps.   Baseline: hurts, using both alternating and step to   PLAN: PT FREQUENCY: 1-2x/week  PT DURATION: 6 weeks  PLANNED INTERVENTIONS: Therapeutic exercises, Therapeutic activity, Neuromuscular re-education, Balance training, Gait training, Patient/Family education, Self Care, Joint mobilization, Joint manipulation, Stair training, Aquatic Therapy, Electrical stimulation, Spinal manipulation, Spinal mobilization, Cryotherapy, Moist heat,  Taping, Traction, Ultrasound, and Manual therapy.  PLAN FOR NEXT SESSION: Knee strengthening, Aquatics for LB and LE stretching, trial water Pilates/ core strengthening  Stanton Kidney Tharon Aquas) Zaidyn Claire MPT 06/23/22 5:22 PM [facility] [address] [place], [place], [address] Phone: [phone]   Fax:  [phone]

## 2022-07-29 ENCOUNTER — Ambulatory Visit (HOSPITAL_BASED_OUTPATIENT_CLINIC_OR_DEPARTMENT_OTHER): Payer: Federal, State, Local not specified - PPO | Attending: Orthopaedic Surgery | Admitting: Physical Therapy

## 2022-07-29 ENCOUNTER — Encounter (HOSPITAL_BASED_OUTPATIENT_CLINIC_OR_DEPARTMENT_OTHER): Payer: Self-pay | Admitting: Physical Therapy

## 2022-07-29 DIAGNOSIS — M25572 Pain in left ankle and joints of left foot: Secondary | ICD-10-CM | POA: Diagnosis not present

## 2022-07-29 DIAGNOSIS — M5459 Other low back pain: Secondary | ICD-10-CM | POA: Diagnosis not present

## 2022-07-29 DIAGNOSIS — M25562 Pain in left knee: Secondary | ICD-10-CM | POA: Insufficient documentation

## 2022-07-29 DIAGNOSIS — M6281 Muscle weakness (generalized): Secondary | ICD-10-CM | POA: Diagnosis not present

## 2022-07-29 DIAGNOSIS — Z1231 Encounter for screening mammogram for malignant neoplasm of breast: Secondary | ICD-10-CM | POA: Diagnosis not present

## 2022-07-29 LAB — HM MAMMOGRAPHY: HM Mammogram: NORMAL (ref 0–4)

## 2022-07-31 DIAGNOSIS — S29011A Strain of muscle and tendon of front wall of thorax, initial encounter: Secondary | ICD-10-CM | POA: Diagnosis not present

## 2022-07-31 DIAGNOSIS — R0789 Other chest pain: Secondary | ICD-10-CM | POA: Diagnosis not present

## 2022-08-01 ENCOUNTER — Encounter (HOSPITAL_BASED_OUTPATIENT_CLINIC_OR_DEPARTMENT_OTHER): Payer: Self-pay | Admitting: Physical Therapy

## 2022-08-01 ENCOUNTER — Ambulatory Visit (HOSPITAL_BASED_OUTPATIENT_CLINIC_OR_DEPARTMENT_OTHER): Payer: Federal, State, Local not specified - PPO | Admitting: Physical Therapy

## 2022-08-01 DIAGNOSIS — M5459 Other low back pain: Secondary | ICD-10-CM | POA: Diagnosis not present

## 2022-08-01 DIAGNOSIS — M6281 Muscle weakness (generalized): Secondary | ICD-10-CM

## 2022-08-01 DIAGNOSIS — M25572 Pain in left ankle and joints of left foot: Secondary | ICD-10-CM | POA: Diagnosis not present

## 2022-08-01 DIAGNOSIS — M25562 Pain in left knee: Secondary | ICD-10-CM

## 2022-08-01 NOTE — Therapy (Signed)
OUTPATIENT PHYSICAL THERAPY THORACOLUMBAR TREATMENT NOTE            Re-assess/re-cert  Patient Name: Maria Bryan MRN: [id number] DOB:[birth year], 46 y.o., female Today's Date: 08/01/2022   PT End of Session - 08/01/22 1039     Visit Number 910    Number of Visits 23    Date for PT Re-Evaluation 09/09/22    Authorization Type [employer]    Authorization Time Period 07/29/22 - 09/09/22    Progress Note Due on Visit 18    PT Start Time 1035    PT Stop Time 1115    PT Time Calculation (min) 40 min    Activity Tolerance Patient tolerated treatment well    Behavior During Therapy [facility] for tasks assessed/performed               PT End of Session -     Visit Number 7   Number of Visits 23   Date for PT Re-Evaluation 08/16/22   Authorization Type    PT Start Time  900   PT Stop Time 945   PT Time Calculation (min) 45   Activity Tolerance Patient tolerated treatment well   Behavior During Therapy  WFL for tasks assessed/performed      Past Medical History:  Diagnosis Date   Hypertension    Obesity    Past Surgical History:  Procedure Laterality Date   ACHILLES TENDON REPAIR Left 2011   [place] N/A 06/15/2018   Procedure: LAPAROSCOPIC GASTRIC SLEEVE RESECTION, UPPER ENDO, ERAS PATHWAY;  Surgeon: Greer Pickerel, MD;  Location: [facility];  Service: General;  Laterality: N/A;   TUBAL LIGATION     Patient Active Problem List   Diagnosis Date Noted   Other intervertebral disc degeneration, lumbar region 09/26/2021   OSA (obstructive sleep apnea) 06/15/2018   Chronic lumbosacral pain 06/15/2018   Essential hypertension 05/24/2018   Heart palpitations 01/06/2018   Snoring 01/06/2018   Morbid obesity ([place]) 01/06/2018    PCP: Glendale Chard, MD  REFERRING PROVIDER: Jean Rosenthal M  REFERRING DIAG:  M51.36 (ICD-10-CM) - Other intervertebral disc degeneration, lumbar region  M54.50,G89.29 (ICD-10-CM) - Chronic  bilateral low back pain, unspecified whether sciatica present    Rationale for Evaluation and Treatment Rehabilitation  THERAPY DIAG:  Acute pain of left knee  Pain in left ankle and joints of left foot  Other low back pain  Muscle weakness (generalized)  ONSET DATE: 09/04/2021  SUBJECTIVE:  SUBJECTIVE STATEMENT: "Pain is better today""    PERTINENT HISTORY:  HTN; obesity; bariatric surgery in 2019   PAIN:  Are you having pain? Yes and stiffness  NPRS scale: 3/10  Pain location: R lower back,    Pain description: stiff  Aggravating factors: prolonged standing Relieving factors: sitting, lying down, ice, massage, stretching, tylenol   PAIN:  Are you having pain? Yes: NPRS scale: 3/10 Pain location: left knee Pain description: ache, sharp Aggravating factors: walking pivoting Relieving factors: sitting resting  Right shoulder/c-spine recent dysfunction etiology unknown 2/10 Right clavical/anterior to posterior shoudler into cervical spine Aggravating: side lying, lifting Relieves: supine; ice  PRECAUTIONS: None  WEIGHT BEARING RESTRICTIONS No  FALLS:  Has patient fallen in last 6 months? No  LIVING ENVIRONMENT: Lives with: lives with their family Lives in: House/apartment Stairs: Yes: Internal: 16 steps; on right going up Has following equipment at home: None  OCCUPATION: attorney, sitting  PLOF: Independent  PATIENT GOALS increase strength, decrease pain, get back to exercise program   OBJECTIVE:   DIAGNOSTIC FINDINGS:  [place] includes significant lower lumbar degenerative changes with disc desiccation 5 mm AP diameter canal at L4-5 severe right moderate left lateral recess narrowing.  Moderate foraminal encroachment.  L5-S1  disc bulge right greater than left extraforaminal disc extension.  Central canal without stenosis.  Moderate left and moderate to severe right foraminal encroachment.   MRI left knee: 10/23 results not in epic, as read on Pt's phone: MRI results (as therapist read on pt phone not in epic yet) Chondromalacia patella, subtle breakdown of meniscus.   07/22/22: X-ray right clavicle neg as per pt  PATIENT SURVEYS:  FOTO 44 with goal of 59 07/29/22: 50%  SCREENING FOR RED FLAGS: Bowel or bladder incontinence: No   COGNITION:  Overall cognitive status: Within functional limits for tasks assessed     SENSATION: wfl  MUSCLE LENGTH: wfl  POSTURE: anterior pelvic tilt  PALPATION: TTP paraspinals right sided mid thoracic through lumbar 12/4: slight TTP  LUMBAR ROM:   Active  A/PROM  eval AROM 10/27 AROM 12/4  Flexion fullP!    Extension full    Right lateral flexion 75%  full  Left lateral flexion 75%  full  Right rotation 10%P! 25% P! Full p!  Left rotation 25%P! 50%P! Full P!   (Blank rows = not tested)  LOWER EXTREMITY ROM:     Active  Right eval Left eval Left 10/27 Left 12/4  Hip flexion wfl wfl    Hip extension      Hip abduction      Hip adduction      Hip internal rotation      Hip external rotation      Knee flexion wfl wfl 125d 122d  Knee extension   0   Ankle dorsiflexion      Ankle plantarflexion      Ankle inversion      Ankle eversion       (Blank rows = not tested)  LOWER EXTREMITY MMT:    MMT Right eval Left eval Right / Left 06/21/22 Right / Left  Hip flexion 4 4    Hip extension      Hip abduction 5 5    Hip adduction      Hip internal rotation      Hip external rotation      Knee flexion   50 / 41.1 67.0 /60.1   Knee extension   47.0 /  46.2 43.7 /43.7  Ankle dorsiflexion      Ankle plantarflexion      Ankle inversion      Ankle eversion      Core strength 4-/5   KNEE TEST: Clarks test: positive left Compression/distraction  test:neg  LUMBAR SPECIAL TESTS:  Straight leg raise test: Negative, Slump test: Negative, and Stork standing: Negative   FUNCTIONAL TESTS:  5 times sit to stand: 25 Timed up and go (TUG): 11  10/25: 5x STS: 12.91  TUG: 9.15  12/4: 5X STS:10          TUG: 8   GAIT: Distance walked: 500 ft Assistive device utilized: None Level of assistance: Complete Independence Comments: gait wnl  10/25: pt with slight antalgic limp lle.               12/4: Normal gait   TODAY'S TREATMENT    Pt seen for aquatic therapy today.  Treatment took place in water 3.25-4.5 ft in depth at the [facility]. Temp of water was 92.  Pt entered/exited the pool via stairs independently with bilat rail.   *Walking forward, back and side stepping.  Cues for upright positioning. *L stretch *Hip hinge against wall for post core strengthening. VC and demonstration for execution. *Core engagement: LE staggered RE/L then feet together x 15 s in both sagittal and frontal planes  - plank on steps with hip extension  - plank on yellow hand buoys holding 3x30 sec then with arm raises x 5 *Noodle step down in neutral then ER hip x 10 slow then x 15s fast R/L *ankle and hip strategy engagement/proprioceptive task with frontal plane oscillations (add/abd of hip) x15s *reactive standing balance: standing on sm squoodle rotating forward and back then maintaining balance directly on feet side by side. Then tandem.  Pt requires the buoyancy and hydrostatic pressure of water for support, and to offload joints by unweighting joint load by at least 50 % in navel deep water and by at least 75-80% in chest to neck deep water.  Viscosity of the water is needed for resistance of strengthening. Water current perturbations provides challenge to standing balance requiring increased core activation.    PATIENT EDUCATION:  Education details: aquatics progressions and modifications; HEP (land stretches) - issued  another copy 10/13 Person educated: Patient Education method: Explanation Education comprehension: verbalized understanding   HOME EXERCISE PROGRAM: [email or web address] (last episode;  edited on 06/05/2022) Access Code: [email or web address] URL: [email or web address] Date: 06/05/2022 Prepared by: [facility] [address]  Exercises - Supine Bridge  - 1 x daily - 7 x weekly - 1 sets - 10-15 reps - Standing Lumbar Extension  - 5 x daily - 7 x weekly - 1 sets - 2 reps - 5 seconds hold - Seated Hamstring Stretch  - 1 x daily - 7 x weekly - 1 sets - 2 reps - Seated Piriformis Stretch  - 1 x daily - 7 x weekly - 1 sets - 2 reps - 20 seconds hold - Quadricep Stretch with Chair and Counter Support  - 1 x daily - 7 x weekly - 1 sets - 2 reps - 20 seconds hold ASSESSMENT:  CLINICAL IMPRESSION: Progressed strengthening of core and le's. Introduced Best boy which pt has good execution of once motor planning established. Attained excellent core firing. Worked ankle and hip strategies for strength and proprioception. Pt with reduction of pain to 0/10 upon completion  Re-assessment impression: Pt with 6 week lapse in  therapy due to scheduling as well as pt feeling better. Since then she has had an event involving right clavicle, without known trama or event. Xray neg. Repots swelling of medial area of clavicle with pain. It extends throughout right shoulder and cervical spine area.  Has since improved with medication but continues to be limiting.  With objective testing today she has actually improved in most areas including gait speed, strength, sit to stand/power. Foto improved. In past few weeks she has begun water aerobics and use of elliptical at home gym.  She believes she overdid it and had recurrence of knee and LBP. She will benefit from continued skilled physical therapy with plan on 2-3 session in pool for instruction on HEP and remaining land based for management and progression  of strength, instruction on body mechanics and shoulder/cervical spine intervention as appropriate.   Left knee assessment completed.  She has a positive Clark test with weakness in left quad .  Her patella glides slightly laterally with tight IT band and she describes pain in posterior patella area with movement which is consistent with dx of chondromalacia patella. She does have history of left achilles tendon repair and her left foot supinates slightly which may be a factor in knee dysfunction. She has difficulty climbing stairs in home to 2nd floor. She is encouraged to try a arch support to improve LLE alignment to see if this helps to decrease knee pain.   PN: Pt demonstrates improvement in LB reporting decrease in pain and radicular symptoms.  Her lumbar ROM has improved  as well as her objective testing as noted in chart above.  She has been consistently participating in therapy sessions and reports compliance with HEP.   We will continue seeing pt for both knee and le rehab.  Goals have been updated and added.        FROM EVAL: Patient is a 46 y.o. F who was seen today for physical therapy evaluation and treatment for Lumbar disc degeneration, some L4-5 stenosis with left leg pain. Pt was evaluated by Ortho at [address] [facility]) in January without pt follow up returning for scheduled visits. Similar to [facility] assessment , pt's Le and ROM and flexibility are [facility] . She does have decreased ROM and flexibility as well as strength in core limited by pain.  No increase in radicular sx experienced with testing today although she does report radicular sx in RLE earlier today stating they are intermittent. She will benefit aquatic skilled PT to improve functional mobility, increase ROM and strength, decrease pain. Pt may choose to transition on to land based therapy after 4-5 weeks aquatic.    OBJECTIVE IMPAIRMENTS decreased activity tolerance, decreased endurance, decreased ROM, decreased  strength, obesity, and pain.   ACTIVITY LIMITATIONS bending, sitting, and standing  PARTICIPATION LIMITATIONS: cleaning, laundry, shopping, community activity, and yard work  PERSONAL FACTORS Past/current experiences and Time since onset of injury/illness/exacerbation are also affecting patient's functional outcome.   REHAB POTENTIAL: Good  CLINICAL DECISION MAKING: Stable/uncomplicated  EVALUATION COMPLEXITY: Low   GOALS: Goals reviewed with patient? Yes  SHORT TERM GOALS: Target date: 06/05/22  Patient to demonstrate independence in on land HEP  [place] on land program Goal status: Met  2.  Pt will complete 5 X STS in under 15s to demonstrated improved strength and balance Baseline: 25  10/25: 12.91 Goal status: met  3.  Pt to complete Tug in under 10s Baseline: 11s  10/25 9.15   Goal status: Met  LONG TERM GOALS: Target date: 112/22/23  Pt will be indep with final Aquatic HEP Baseline: none Goal status: inital  2.  Pt to meet stated FOTO goal of 59 Baseline: 44 Goal status: ongoing  3.  Pt will increase core strength to 4/5 Baseline: 3+ Goal status: Ongoing  4.  Pt to have improvement in lumbar ROM without pain at end range Baseline: Pain end range with rotation Goal status: ongoing  5. Pt to have decrease in Left knee pain to <2/10 to demonstrate improving condition    Baseline: 5/10   Goal Status: New   6. Pt will have improvement in left knee strength equal to contralateral side to demonstrate improving strength for increased toleration to walking   Baseline: see chart   Goal Status: Partially met   7. Pt will be able to climb stairs using alternation pattern full flight of steps.   Baseline: hurts, using both alternating and step to   Goal: New   8. Pt will be indep with final land based HEP   Baseline: TB assigned   Goal: New  PLAN: PT FREQUENCY: 1-2x/week  PT DURATION: 6 weeks  PLANNED INTERVENTIONS: Therapeutic  exercises, Therapeutic activity, Neuromuscular re-education, Balance training, Gait training, Patient/Family education, Self Care, Joint mobilization, Joint manipulation, Stair training, Aquatic Therapy, Electrical stimulation, Spinal manipulation, Spinal mobilization, Cryotherapy, Moist heat, Taping, Traction, Ultrasound, and Manual therapy.  PLAN FOR NEXT SESSION: Knee strengthening, Aquatics for LB and LE stretching, trial water Pilates/ core strengthening. Land based: HEP; body mechanics, strengthening LE and core, Right shoulder strengthening/Rom, perhaps DN for ms spasm and pain.  Stanton Kidney [place]) Leocadia Idleman MPT 08/01/22 11:24 AM [facility] [address] [place], [place], [address] Phone: [phone]   Fax:  [phone]

## 2022-08-13 DIAGNOSIS — N898 Other specified noninflammatory disorders of vagina: Secondary | ICD-10-CM | POA: Diagnosis not present

## 2022-08-13 DIAGNOSIS — Z1151 Encounter for screening for human papillomavirus (HPV): Secondary | ICD-10-CM | POA: Diagnosis not present

## 2022-08-13 DIAGNOSIS — Z01419 Encounter for gynecological examination (general) (routine) without abnormal findings: Secondary | ICD-10-CM | POA: Diagnosis not present

## 2022-08-13 DIAGNOSIS — Z113 Encounter for screening for infections with a predominantly sexual mode of transmission: Secondary | ICD-10-CM | POA: Diagnosis not present

## 2022-08-13 DIAGNOSIS — Z124 Encounter for screening for malignant neoplasm of cervix: Secondary | ICD-10-CM | POA: Diagnosis not present

## 2022-08-14 ENCOUNTER — Ambulatory Visit (HOSPITAL_BASED_OUTPATIENT_CLINIC_OR_DEPARTMENT_OTHER): Payer: Federal, State, Local not specified - PPO | Admitting: Physical Therapy

## 2022-08-16 ENCOUNTER — Telehealth (HOSPITAL_BASED_OUTPATIENT_CLINIC_OR_DEPARTMENT_OTHER): Payer: Self-pay | Admitting: Physical Therapy

## 2022-08-16 ENCOUNTER — Ambulatory Visit (HOSPITAL_BASED_OUTPATIENT_CLINIC_OR_DEPARTMENT_OTHER): Payer: Federal, State, Local not specified - PPO | Admitting: Physical Therapy

## 2022-08-16 NOTE — Telephone Encounter (Signed)
No-show for today's appointment. Called and talked to her, she apologizes  for missing this appointment. Reminded her of time/date for next appointment.  Lerry Liner PT DPT PN2

## 2022-08-21 ENCOUNTER — Encounter (HOSPITAL_BASED_OUTPATIENT_CLINIC_OR_DEPARTMENT_OTHER): Payer: Self-pay | Admitting: Physical Therapy

## 2022-08-21 ENCOUNTER — Ambulatory Visit (HOSPITAL_BASED_OUTPATIENT_CLINIC_OR_DEPARTMENT_OTHER): Payer: Federal, State, Local not specified - PPO | Admitting: Physical Therapy

## 2022-08-21 DIAGNOSIS — M5459 Other low back pain: Secondary | ICD-10-CM

## 2022-08-21 DIAGNOSIS — M25562 Pain in left knee: Secondary | ICD-10-CM | POA: Diagnosis not present

## 2022-08-21 DIAGNOSIS — M6281 Muscle weakness (generalized): Secondary | ICD-10-CM

## 2022-08-21 DIAGNOSIS — M25572 Pain in left ankle and joints of left foot: Secondary | ICD-10-CM | POA: Diagnosis not present

## 2022-08-21 LAB — HM PAP SMEAR

## 2022-08-21 NOTE — Therapy (Signed)
OUTPATIENT PHYSICAL THERAPY THORACOLUMBAR TREATMENT NOTE             Patient Name: Maria Bryan MRN: [id number] DOB:10-10-1975, 46 y.o., female Today's Date: 08/21/2022   PT End of Session - 08/21/22 0858     Visit Number 10    Number of Visits 23    Date for PT Re-Evaluation 09/09/22    Authorization Type BCBS    PT Start Time 0854    PT Stop Time 0929    PT Time Calculation (min) 35 min    Activity Tolerance Patient tolerated treatment well    Behavior During Therapy [facility] for tasks assessed/performed                Past Medical History:  Diagnosis Date   Hypertension    Obesity    Past Surgical History:  Procedure Laterality Date   ACHILLES TENDON REPAIR Left 2011   CESAREAN SECTION     LAPAROSCOPIC GASTRIC SLEEVE RESECTION N/A 06/15/2018   Procedure: LAPAROSCOPIC GASTRIC SLEEVE RESECTION, UPPER ENDO, ERAS PATHWAY;  Surgeon: Greer Pickerel, MD;  Location: [facility];  Service: General;  Laterality: N/A;   TUBAL LIGATION     Patient Active Problem List   Diagnosis Date Noted   Other intervertebral disc degeneration, lumbar region 09/26/2021   OSA (obstructive sleep apnea) 06/15/2018   Chronic lumbosacral pain 06/15/2018   Essential hypertension 05/24/2018   Heart palpitations 01/06/2018   Snoring 01/06/2018   Morbid obesity ([place]) 01/06/2018    PCP: Glendale Chard, MD  REFERRING PROVIDER: Jean Rosenthal M  REFERRING DIAG:  M51.36 (ICD-10-CM) - Other intervertebral disc degeneration, lumbar region  M54.50,G89.29 (ICD-10-CM) - Chronic bilateral low back pain, unspecified whether sciatica present    Rationale for Evaluation and Treatment Rehabilitation  THERAPY DIAG:  Muscle weakness (generalized)  Other low back pain  Acute pain of left knee  ONSET DATE: 09/04/2021  SUBJECTIVE:                                                                                                                                                                                            SUBJECTIVE STATEMENT: Pt performs her home stretches consistently though has not performed the bridging.  Pt states her pain is improving or she is managing it better.  Pt reports much improved radicular sx's into bilat LE's and still has 1-2/10 N/T in bilat LE's.  Episodes of radicular sx's are much less. She uses a theragun which helps.  Pt uses ice, compression, and rest to treat L knee which helps.  Pt rarely has episodes of L knee instability which is an improvement.  Pt states she was sore after prior Rx.    Pt has stiffness in knee with sitting.  She has difficulty with stairs.  Pt has difficulty with reaching into an overhead cabinet.  Pt has pain and limitations with playing with her 2 children.     Pt was performing some water aerobics and some gym exercises at the [facility] prior to Thanksgiving.  Pt began having clavicle/shoulder pain.  She stopped going to the [facility] and performing water aerobics.  Pt then picked her son up and had increased clavicle pain and swelling.  Pt reports her pain and swelling are improving though she continues to have pain.  Pt had a MRI and was informed no fractures, but does have some arthritis.      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 Yes and stiffness  NPRS scale: 3-4/10 current, 7/10 worst, 0/10 best Pain location: Central lower lumbar     Pain description: aching, stiffness  Aggravating factors: prolonged standing Relieving factors: sitting, lying down, ice, massage, stretching, tylenol   PAIN:  Are you having pain? Yes: NPRS scale: 4/10 current, 8-9/10 worst, Best: 1-2/10 Pain location: left knee Pain description: ache, sharp Aggravating factors: walking pivoting Relieving factors: sitting resting  Right shoulder/c-spine recent dysfunction etiology unknown 8/10 Right clavical/anterior to posterior  shoudler into cervical spine Aggravating: side lying, lifting Relieves: supine; ice  PRECAUTIONS: None  WEIGHT BEARING RESTRICTIONS No  FALLS:  Has patient fallen in last 6 months? No  LIVING ENVIRONMENT: Lives with: lives with their family Lives in: House/apartment Stairs: Yes: Internal: 16 steps; on right going up Has following equipment at home: None  OCCUPATION: attorney, sitting  PLOF: Independent  PATIENT GOALS increase strength, decrease pain, get back to exercise program   OBJECTIVE:   DIAGNOSTIC FINDINGS:  Previous [place] includes significant lower lumbar degenerative changes with disc desiccation 5 mm AP diameter canal at L4-5 severe right moderate left lateral recess narrowing.  Moderate foraminal encroachment.  L5-S1 disc bulge right greater than left extraforaminal disc extension.  Central canal without stenosis.  Moderate left and moderate to severe right foraminal encroachment.   MRI left knee: 10/23 results not in epic, as read on Pt's phone: MRI results (as therapist read on pt phone not in epic yet) Chondromalacia patella, subtle breakdown of meniscus.   07/22/22: X-ray right clavicle neg as per pt   TODAY'S TREATMENT    Reviewed current function, pt presentation, pain levels, HEP compliance, and response to prior Rx.   PT educated pt with contraction of and palpation of TrA.  Pt performed supine TrA contraction with 5 sec hold.   Pt performed: Supine marching with TrA  x 10 reps Supine bridge with TrA x 10 Supine PPT 2x10  Pt received a HEP handout and was educated in correct form and appropriate frequency.  Pt instructed she should not have pain with HEP.      PATIENT EDUCATION:  Education details:  HEP, POC, exercise form, and exercise rationale Person educated: Patient Education method: Explanation, verbal and tactile cues, demonstration, handout, Education comprehension: verbalized understanding, returned  demonstration, verbal and tactile cues required   HOME EXERCISE PROGRAM: [email or web address] (last episode;  edited on 06/05/2022) Access Code: [email or web address] URL: [email or web address] Date: 06/05/2022 Prepared by: [place]  Exercises - Supine Bridge  - 1 x daily - 7 x  weekly - 1 sets - 10-15 reps - Standing Lumbar Extension  - 5 x daily - 7 x weekly - 1 sets - 2 reps - 5 seconds hold - Seated Hamstring Stretch  - 1 x daily - 7 x weekly - 1 sets - 2 reps - Seated Piriformis Stretch  - 1 x daily - 7 x weekly - 1 sets - 2 reps - 20 seconds hold - Quadricep Stretch with Chair and Counter Support  - 1 x daily - 7 x weekly - 1 sets - 2 reps - 20 seconds hold  Updated HEP: Access Code: [email or web address] URL: [email or web address] Date: 08/21/2022 Prepared by: Ronny Flurry  Exercises - Supine Transversus Abdominis Bracing - Hands on Stomach  - 1 x daily - 7 x weekly - 2 sets - 10 reps - 5 seconds hold - Supine March  - 1-2 x daily - 7 x weekly - 2 sets - 10 reps - Supine Posterior Pelvic Tilt  - 2 x daily - 7 x weekly - 2 sets - 10 reps  ASSESSMENT:  CLINICAL IMPRESSION: Pt presents to PT for a land based treatment.  She has been receiving aquatic therapy and reports improvement.  Pt reports improved pain, radicular sx's, and L knee stability.  Pt has a recent onset of shoulder/clavicle pain which is improving.  She had a MRI and states it showed no fx, just arthritis.  Pt has a land based HEP which she has been performing the stretches daily.  PT instructed pt in supine core strengthening exercises including TrA contraction.  Pt demonstrated good form with TrA contraction and performed exercises well with cuing and instruction in correct form.  PT updated HEP with supine core exercises.  She responded well to Rx having no increased pain and no c/o's after Rx.  Pt should cont to benefit from cont skilled PT services to improve core and LE strength,  address goals, and improve function.      OBJECTIVE IMPAIRMENTS decreased activity tolerance, decreased endurance, decreased ROM, decreased strength, obesity, and pain.   ACTIVITY LIMITATIONS bending, sitting, and standing  PARTICIPATION LIMITATIONS: cleaning, laundry, shopping, community activity, and yard work  PERSONAL FACTORS Past/current experiences and Time since onset of injury/illness/exacerbation are also affecting patient's functional outcome.   REHAB POTENTIAL: Good  CLINICAL DECISION MAKING: Stable/uncomplicated  EVALUATION COMPLEXITY: Low   GOALS: Goals reviewed with patient? Yes  SHORT TERM GOALS: Target date: 06/05/22  Patient to demonstrate independence in on land HEP  [place] on land program Goal status: Met  2.  Pt will complete 5 X STS in under 15s to demonstrated improved strength and balance Baseline: 25  10/25: 12.91 Goal status: met  3.  Pt to complete Tug in under 10s Baseline: 11s  10/25 9.15   Goal status: Met     LONG TERM GOALS: Target date: 112/22/23  Pt will be indep with final Aquatic HEP Baseline: none Goal status: inital  2.  Pt to meet stated FOTO goal of 59 Baseline: 44 Goal status: ongoing  3.  Pt will increase core strength to 4/5 Baseline: 3+ Goal status: Ongoing  4.  Pt to have improvement in lumbar ROM without pain at end range Baseline: Pain end range with rotation Goal status: ongoing  5. Pt to have decrease in Left knee pain to <2/10 to demonstrate improving condition    Baseline: 5/10   Goal Status: New   6. Pt will have improvement in left knee strength equal to contralateral side  to demonstrate improving strength for increased toleration to walking   Baseline: see chart   Goal Status: Partially met   7. Pt will be able to climb stairs using alternation pattern full flight of steps.   Baseline: hurts, using both alternating and step to   Goal: New   8. Pt will be indep with final land based  HEP   Baseline: TB assigned   Goal: New  PLAN: PT FREQUENCY: 1-2x/week  PT DURATION: 6 weeks  PLANNED INTERVENTIONS: Therapeutic exercises, Therapeutic activity, Neuromuscular re-education, Balance training, Gait training, Patient/Family education, Self Care, Joint mobilization, Joint manipulation, Stair training, Aquatic Therapy, Electrical stimulation, Spinal manipulation, Spinal mobilization, Cryotherapy, Moist heat, Taping, Traction, Ultrasound, and Manual therapy.  PLAN FOR NEXT SESSION: Knee strengthening, Aquatics for LB and LE stretching, trial water Pilates/ core strengthening. Land based: HEP; Economist, strengthening LE and core  Selinda Michaels III PT, DPT 08/21/22 5:28 PM

## 2022-08-28 ENCOUNTER — Encounter (HOSPITAL_BASED_OUTPATIENT_CLINIC_OR_DEPARTMENT_OTHER): Payer: Self-pay | Admitting: Physical Therapy

## 2022-08-28 ENCOUNTER — Ambulatory Visit (HOSPITAL_BASED_OUTPATIENT_CLINIC_OR_DEPARTMENT_OTHER): Payer: Federal, State, Local not specified - PPO | Attending: Orthopaedic Surgery | Admitting: Physical Therapy

## 2022-08-28 DIAGNOSIS — M25562 Pain in left knee: Secondary | ICD-10-CM | POA: Diagnosis not present

## 2022-08-28 DIAGNOSIS — M6281 Muscle weakness (generalized): Secondary | ICD-10-CM | POA: Diagnosis not present

## 2022-08-28 DIAGNOSIS — M5459 Other low back pain: Secondary | ICD-10-CM | POA: Diagnosis not present

## 2022-08-28 DIAGNOSIS — M25511 Pain in right shoulder: Secondary | ICD-10-CM | POA: Insufficient documentation

## 2022-08-28 DIAGNOSIS — M25611 Stiffness of right shoulder, not elsewhere classified: Secondary | ICD-10-CM | POA: Insufficient documentation

## 2022-08-28 NOTE — Therapy (Signed)
OUTPATIENT PHYSICAL THERAPY THORACOLUMBAR TREATMENT NOTE             Patient Name: Maria Bryan MRN: [id number] DOB:07-Jun-1976, 47 y.o., female Today's Date: 08/29/2022   PT End of Session - 08/28/22 1027     Visit Number 11    Number of Visits 23    Date for PT Re-Evaluation 09/09/22    Authorization Type BCBS    PT Start Time 0855    PT Stop Time 0929    PT Time Calculation (min) 34 min    Activity Tolerance Patient tolerated treatment well    Behavior During Therapy [facility] for tasks assessed/performed                 Past Medical History:  Diagnosis Date   Hypertension    Obesity    Past Surgical History:  Procedure Laterality Date   ACHILLES TENDON REPAIR Left 2011   CESAREAN SECTION     LAPAROSCOPIC GASTRIC SLEEVE RESECTION N/A 06/15/2018   Procedure: LAPAROSCOPIC GASTRIC SLEEVE RESECTION, UPPER ENDO, ERAS PATHWAY;  Surgeon: Greer Pickerel, MD;  Location: [facility];  Service: General;  Laterality: N/A;   TUBAL LIGATION     Patient Active Problem List   Diagnosis Date Noted   Other intervertebral disc degeneration, lumbar region 09/26/2021   OSA (obstructive sleep apnea) 06/15/2018   Chronic lumbosacral pain 06/15/2018   Essential hypertension 05/24/2018   Heart palpitations 01/06/2018   Snoring 01/06/2018   Morbid obesity ([place]) 01/06/2018    PCP: Glendale Chard, MD  REFERRING PROVIDER: Jean Rosenthal M  REFERRING DIAG:  M51.36 (ICD-10-CM) - Other intervertebral disc degeneration, lumbar region  M54.50,G89.29 (ICD-10-CM) - Chronic bilateral low back pain, unspecified whether sciatica present    Rationale for Evaluation and Treatment Rehabilitation  THERAPY DIAG:  Muscle weakness (generalized)  Other low back pain  Acute pain of left knee  ONSET DATE: 09/04/2021  SUBJECTIVE:                                                                                                                                                                                            SUBJECTIVE STATEMENT: Pt has stiffness in knee with sitting.  She has difficulty with stairs.  Pt has difficulty with reaching into an overhead cabinet.  Pt has pain and limitations with playing with her 2 children.     Pt had a x ray of her shoulder and was informed no fractures, but does have some arthritis.  Pt states her shoulder is still angry and her clavicle is still swollen.  Pt may talk to MD about getting a MRI.  Pt had some cervical issues awhile ago which resolved though now her neck has become more stiff.    Pt reports having some soreness and pain which lingered after prior Rx.  Pt reports compliance with HEP.  Pt denies increased pain with exercises though did feel some irritation with supine marching.  Pt stopped land therapy in early 2023 due to increased pain.  Pt states her L knee has bothered her some.  She has to find the right position of flexion for her knee with the exercises.  She uses a theragun for her lumbar and shoulder which helps.       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 Yes and stiffness  NPRS scale: 7/10 current, 7/10 worst, 0/10 best Pain location: Central lower lumbar     Pain description: aching, stiffness  Aggravating factors: prolonged standing Relieving factors: sitting, lying down, ice, massage, stretching, tylenol   PAIN:  Are you having pain? Yes: NPRS scale: 4/10 current, 8-9/10 worst, Best: 1-2/10 Pain location: left knee Pain description: ache, sharp Aggravating factors: walking pivoting Relieving factors: sitting resting  Right shoulder/c-spine recent dysfunction etiology unknown 8/10 Right clavical/anterior to posterior shoudler into cervical spine Aggravating: side lying, lifting Relieves: supine; ice  PRECAUTIONS: None  WEIGHT BEARING RESTRICTIONS No  FALLS:  Has patient fallen in last  6 months? No  LIVING ENVIRONMENT: Lives with: lives with their family Lives in: House/apartment Stairs: Yes: Internal: 16 steps; on right going up Has following equipment at home: None  OCCUPATION: attorney, sitting  PLOF: Independent  PATIENT GOALS increase strength, decrease pain, get back to exercise program   OBJECTIVE:   DIAGNOSTIC FINDINGS:  Previous MRI scan 09/18/2020 [place] report includes significant lower lumbar degenerative changes with disc desiccation 5 mm AP diameter canal at L4-5 severe right moderate left lateral recess narrowing.  Moderate foraminal encroachment.  L5-S1 disc bulge right greater than left extraforaminal disc extension.  Central canal without stenosis.  Moderate left and moderate to severe right foraminal encroachment.   MRI left knee: 10/23 results not in epic, as read on Pt's phone: MRI results (as therapist read on pt phone not in epic yet) Chondromalacia patella, subtle breakdown of meniscus.   07/22/22: X-ray right clavicle neg as per pt   TODAY'S TREATMENT   OBSERVATION:  Pt has a more prominent R proximal clavicle.     Reviewed pt presentation, pain levels, HEP compliance, and response to prior Rx.   PT educated pt with contraction of and palpation of TrA.  Pt performed supine TrA contraction with 5 sec hold.   Pt performed: Supine marching with TrA x 10 reps Supine bridge with TrA x 10 Supine PPT 2x10 Supine clams with TrA 2x10 with RTB Supine manual HS stretch x30 sec bilat Supine HS stretch with strap 2x30 sec bilat  PT updated HEP.  Pt received a HEP handout and was educated in correct form and appropriate frequency.  Pt instructed she should not have pain with HEP.      PATIENT EDUCATION:  Education details:  HEP, POC, exercise form, and exercise rationale Person educated: Patient Education method: Explanation, verbal and tactile cues, demonstration, handout, Education comprehension: verbalized understanding, returned  demonstration, verbal and tactile cues required   HOME EXERCISE PROGRAM: [email or web address] (last episode;  edited on 06/05/2022) Access Code: [email or web address] URL: [email or web address] Date: 06/05/2022 Prepared by: [place]  Updated HEP on prior visit: Access Code: [email or web address] URL: [email or web address] Date: 08/21/2022 Prepared by: Ronny Flurry  Exercises - Supine Transversus Abdominis Bracing - Hands on Stomach  - 1 x daily - 7 x weekly - 2 sets - 10 reps - 5 seconds hold - Supine March  - 1-2 x daily - 7 x weekly - 2 sets - 10 reps - Supine Posterior Pelvic Tilt  - 2 x daily - 7 x weekly - 2 sets - 10 reps  Updated HEP today: - Hooklying Clamshell with Resistance  - 1 x daily - 4-5 x weekly - 2 sets - 10 reps - Supine Hamstring Stretch with Strap  - 2 x daily - 7 x weekly - 1 sets - 2 reps - 20-30 seconds hold  ASSESSMENT:  CLINICAL IMPRESSION: Pt had some soreness and pain after prior land based Rx.  PT reviewed HEP and updated HEP.  Pt demonstrates good understanding of HEP.  PT provided cuing for form with core exercises.  She demonstrates improved form with cuing and instruction and also had improved form compared to prior Rx.  Pt states she felt much better with HS stretch.  Pt responded well to Rx stating her back and knee felt better after Rx reporting improved lumbar pain from 7/10 before Rx to 4-5/10 after Rx.  Pt should cont to benefit from cont skilled PT services to improve core and LE strength, address goals, and improve function.        OBJECTIVE IMPAIRMENTS decreased activity tolerance, decreased endurance, decreased ROM, decreased strength, obesity, and pain.   ACTIVITY LIMITATIONS bending, sitting, and standing  PARTICIPATION LIMITATIONS: cleaning, laundry, shopping, community activity, and yard work  PERSONAL FACTORS Past/current experiences and Time since onset of injury/illness/exacerbation are also affecting  patient's functional outcome.   REHAB POTENTIAL: Good  CLINICAL DECISION MAKING: Stable/uncomplicated  EVALUATION COMPLEXITY: Low   GOALS: Goals reviewed with patient? Yes  SHORT TERM GOALS: Target date: 06/05/22  Patient to demonstrate independence in on land HEP  [place] on land program Goal status: Met  2.  Pt will complete 5 X STS in under 15s to demonstrated improved strength and balance Baseline: 25  10/25: 12.91 Goal status: met  3.  Pt to complete Tug in under 10s Baseline: 11s  10/25 9.15   Goal status: Met     LONG TERM GOALS: Target date: 112/22/23  Pt will be indep with final Aquatic HEP Baseline: none Goal status: inital  2.  Pt to meet stated FOTO goal of 59 Baseline: 44 Goal status: ongoing  3.  Pt will increase core strength to 4/5 Baseline: 3+ Goal status: Ongoing  4.  Pt to have improvement in lumbar ROM without pain at end range Baseline: Pain end range with rotation Goal status: ongoing  5. Pt to have decrease in Left knee pain to <2/10 to demonstrate improving condition    Baseline: 5/10   Goal Status: New   6. Pt will have improvement in left knee strength equal to contralateral side to demonstrate improving strength for increased toleration to walking   Baseline: see chart   Goal Status: Partially met   7. Pt will be able to climb stairs using alternation pattern full flight of steps.   Baseline: hurts, using both alternating and step to   Goal: New   8. Pt will be indep with final land based HEP   Baseline: TB assigned   Goal: New  PLAN: PT FREQUENCY: 1-2x/week  PT DURATION: 6  weeks  PLANNED INTERVENTIONS: Therapeutic exercises, Therapeutic activity, Neuromuscular re-education, Balance training, Gait training, Patient/Family education, Self Care, Joint mobilization, Joint manipulation, Stair training, Aquatic Therapy, Electrical stimulation, Spinal manipulation, Spinal mobilization, Cryotherapy, Moist heat,  Taping, Traction, Ultrasound, and Manual therapy.  PLAN FOR NEXT SESSION: Knee strengthening, Aquatics for LB and LE stretching, trial water Pilates/ core strengthening. Land based: HEP; Economist, strengthening LE and core  Selinda Michaels III PT, DPT 08/29/22 9:25 AM

## 2022-08-29 DIAGNOSIS — M25512 Pain in left shoulder: Secondary | ICD-10-CM | POA: Diagnosis not present

## 2022-08-29 DIAGNOSIS — M7541 Impingement syndrome of right shoulder: Secondary | ICD-10-CM | POA: Diagnosis not present

## 2022-08-30 ENCOUNTER — Ambulatory Visit (HOSPITAL_BASED_OUTPATIENT_CLINIC_OR_DEPARTMENT_OTHER): Payer: Federal, State, Local not specified - PPO | Admitting: Physical Therapy

## 2022-08-30 ENCOUNTER — Encounter (HOSPITAL_BASED_OUTPATIENT_CLINIC_OR_DEPARTMENT_OTHER): Payer: Self-pay | Admitting: Physical Therapy

## 2022-08-30 DIAGNOSIS — M25611 Stiffness of right shoulder, not elsewhere classified: Secondary | ICD-10-CM | POA: Diagnosis not present

## 2022-08-30 DIAGNOSIS — M25511 Pain in right shoulder: Secondary | ICD-10-CM | POA: Diagnosis not present

## 2022-08-30 DIAGNOSIS — M25562 Pain in left knee: Secondary | ICD-10-CM

## 2022-08-30 DIAGNOSIS — M5459 Other low back pain: Secondary | ICD-10-CM | POA: Diagnosis not present

## 2022-08-30 DIAGNOSIS — M6281 Muscle weakness (generalized): Secondary | ICD-10-CM

## 2022-08-30 NOTE — Therapy (Signed)
OUTPATIENT PHYSICAL THERAPY THORACOLUMBAR TREATMENT NOTE             Patient Name: Maria Bryan MRN: [id number] DOB:[birth year], 47 y.o., female Today's Date: 08/30/2022   PT End of Session - 08/30/22 1157     Visit Number 12    Number of Visits 23    Date for PT Re-Evaluation 09/09/22    Authorization Type BCBS    PT Start Time 1105    PT Stop Time 1147    PT Time Calculation (min) 42 min    Activity Tolerance Patient tolerated treatment well    Behavior During Therapy WFL for tasks assessed/performed                  Past Medical History:  Diagnosis Date   Hypertension    Obesity    Past Surgical History:  Procedure Laterality Date   ACHILLES TENDON REPAIR Left 2011   [place] N/A 06/15/2018   Procedure: LAPAROSCOPIC GASTRIC SLEEVE RESECTION, UPPER ENDO, ERAS PATHWAY;  Surgeon: Greer Pickerel, MD;  Location: [facility];  Service: General;  Laterality: N/A;   TUBAL LIGATION     Patient Active Problem List   Diagnosis Date Noted   Other intervertebral disc degeneration, lumbar region 09/26/2021   OSA (obstructive sleep apnea) 06/15/2018   Chronic lumbosacral pain 06/15/2018   Essential hypertension 05/24/2018   Heart palpitations 01/06/2018   Snoring 01/06/2018   Morbid obesity ([place]) 01/06/2018    PCP: Glendale Chard, MD  REFERRING PROVIDER: Jean Rosenthal M  REFERRING DIAG:  M51.36 (ICD-10-CM) - Other intervertebral disc degeneration, lumbar region  M54.50,G89.29 (ICD-10-CM) - Chronic bilateral low back pain, unspecified whether sciatica present    Rationale for Evaluation and Treatment Rehabilitation  THERAPY DIAG:  Muscle weakness (generalized)  Other low back pain  Acute pain of left knee  ONSET DATE: 09/04/2021  SUBJECTIVE:                                                                                                                                                                                            SUBJECTIVE STATEMENT: Pt states she felt much better after prior Rx and she has less pain today.  Her back and shoulder are feeling better.  Pt saw MD yesterday for her R shoulder and he started her on prednisone.  MD referred pt to PT for impingement syndrome of R shoulder  She uses a theragun for her lumbar and shoulder which helps.  Pt ordered a theracane and should get it tomorrow.     PERTINENT HISTORY:  Degenerative anterolisthesis  L4-5 HTN; obesity; bariatric surgery in 2019 left achilles tendon repair    PAIN:  Are you having pain? Yes and stiffness  NPRS scale: 3-4/10 current, 7/10 worst, 0/10 best Pain location: Central lower lumbar     Pain description: aching, stiffness  Aggravating factors: prolonged standing Relieving factors: sitting, lying down, ice, massage, stretching, tylenol   PAIN:  Are you having pain? Yes: NPRS scale: 4/10 current, 8-9/10 worst, Best: 1-2/10 Pain location: left knee Pain description: ache, sharp Aggravating factors: walking pivoting Relieving factors: sitting resting  Right shoulder and clavicle 5/10 Right clavical/anterior to posterior shoudler into cervical spine Aggravating: side lying, lifting Relieves: supine; ice  PRECAUTIONS: None  WEIGHT BEARING RESTRICTIONS No  FALLS:  Has patient fallen in last 6 months? No  LIVING ENVIRONMENT: Lives with: lives with their family Lives in: House/apartment Stairs: Yes: Internal: 16 steps; on right going up Has following equipment at home: None  OCCUPATION: attorney, sitting  PLOF: Independent  PATIENT GOALS increase strength, decrease pain, get back to exercise program   OBJECTIVE:   DIAGNOSTIC FINDINGS:  Previous [place] includes significant lower lumbar degenerative changes with disc desiccation 5 mm AP diameter canal at L4-5 severe right moderate left lateral recess narrowing.  Moderate foraminal encroachment.  L5-S1  disc bulge right greater than left extraforaminal disc extension.  Central canal without stenosis.  Moderate left and moderate to severe right foraminal encroachment.   MRI left knee: 10/23 results not in epic, as read on Pt's phone: MRI results (as therapist read on pt phone not in epic yet) Chondromalacia patella, subtle breakdown of meniscus.   07/22/22: X-ray right clavicle neg as per pt   TODAY'S TREATMENT   OBSERVATION:  Pt has a more prominent R proximal clavicle.     Reviewed pt presentation, pain levels, and response to prior Rx.   Pt performed: Supine marching with TrA 2 x 10 reps Supine alt UE/LE with Tra x10 Supine alt LE extension with TrA x12 Supine bridge with TrA 2 x 10 Supine PPT x10 Supine clams with TrA 2x10 with RTB Supine SLR with TrA 2x5 bilat Supine manual HS stretch 3x30 sec bilat    Manual Therapy:  Palpated bilat lumbar paraspinals and glutes for soft tissue tightness and mobility.      -Pt has soft tissue tightness and trigger points in bilat lumbar paraspinals and bilat glutes.  Pt has tenderness at trigger points.   Educated pt in using the theracane and demonstrated using the theracane.  Pt used the theracane in the clinic with PT guidance.     PATIENT EDUCATION:  Education details:  HEP, POC, exercise form, and exercise rationale Person educated: Patient Education method: Explanation, verbal and tactile cues, demonstration, handout, Education comprehension: verbalized understanding, returned demonstration, verbal and tactile cues required   HOME EXERCISE PROGRAM: [email or web address] (last episode;  edited on 06/05/2022) Access Code: [email or web address] URL: [email or web address] Date: 06/05/2022 Prepared by: [place]  Updated HEP on prior visit: Access Code: [email or web address] URL: [email or web address] Date: 08/21/2022 Prepared by: Ronny Flurry    ASSESSMENT:  CLINICAL IMPRESSION: Pt stated she felt  better after prior Rx and reports improved improved pain today.  Pt did begin prednisone yesterday due to her shoulder pain.  She has a PT order for her shoulder indicating impingement syndrome.  PT progressed core exercises and Pt performed well with cuing and instruction in correct form.  Pt has core muscle weakness though is improving.  Pt  states that the HS stretch felt good.  Pt ordered a theracane and PT instructed pt in how to use it today.  Pt used theracane in the clinic with direction from PT.  Pt does have soft tissue tightness in bilat lumbar paraspinals and glutes.  Pt responded well to Rx having no c/o's after Rx.  Pt should cont to benefit from cont skilled PT services to improve core and LE strength, address goals, and improve function.        OBJECTIVE IMPAIRMENTS decreased activity tolerance, decreased endurance, decreased ROM, decreased strength, obesity, and pain.   ACTIVITY LIMITATIONS bending, sitting, and standing  PARTICIPATION LIMITATIONS: cleaning, laundry, shopping, community activity, and yard work  PERSONAL FACTORS Past/current experiences and Time since onset of injury/illness/exacerbation are also affecting patient's functional outcome.   REHAB POTENTIAL: Good  CLINICAL DECISION MAKING: Stable/uncomplicated  EVALUATION COMPLEXITY: Low   GOALS: Goals reviewed with patient? Yes  SHORT TERM GOALS: Target date: 06/05/22  Patient to demonstrate independence in on land HEP  Baseline:re-instruct on land program Goal status: Met  2.  Pt will complete 5 X STS in under 15s to demonstrated improved strength and balance Baseline: 25  10/25: 12.91 Goal status: met  3.  Pt to complete Tug in under 10s Baseline: 11s  10/25 9.15   Goal status: Met     LONG TERM GOALS: Target date: 112/22/23  Pt will be indep with final Aquatic HEP Baseline: none Goal status: inital  2.  Pt to meet stated FOTO goal of 59 Baseline: 44 Goal status: ongoing  3.  Pt will  increase core strength to 4/5 Baseline: 3+ Goal status: Ongoing  4.  Pt to have improvement in lumbar ROM without pain at end range Baseline: Pain end range with rotation Goal status: ongoing  5. Pt to have decrease in Left knee pain to <2/10 to demonstrate improving condition    Baseline: 5/10   Goal Status: New   6. Pt will have improvement in left knee strength equal to contralateral side to demonstrate improving strength for increased toleration to walking   Baseline: see chart   Goal Status: Partially met   7. Pt will be able to climb stairs using alternation pattern full flight of steps.   Baseline: hurts, using both alternating and step to   Goal: New   8. Pt will be indep with final land based HEP   Baseline: TB assigned   Goal: New  PLAN: PT FREQUENCY: 1-2x/week  PT DURATION: 6 weeks  PLANNED INTERVENTIONS: Therapeutic exercises, Therapeutic activity, Neuromuscular re-education, Balance training, Gait training, Patient/Family education, Self Care, Joint mobilization, Joint manipulation, Stair training, Aquatic Therapy, Electrical stimulation, Spinal manipulation, Spinal mobilization, Cryotherapy, Moist heat, Taping, Traction, Ultrasound, and Manual therapy.  PLAN FOR NEXT SESSION: Knee strengthening, Aquatics for LB and LE stretching, trial water Pilates/ core strengthening. Land based: HEP; body mechanics, strengthening LE and core.  STM to lumbar and glutes next visit.   Audie Clear III PT, DPT 08/30/22 4:53 PM

## 2022-09-09 ENCOUNTER — Ambulatory Visit (HOSPITAL_BASED_OUTPATIENT_CLINIC_OR_DEPARTMENT_OTHER): Payer: Federal, State, Local not specified - PPO | Admitting: Physical Therapy

## 2022-09-09 DIAGNOSIS — M25511 Pain in right shoulder: Secondary | ICD-10-CM | POA: Diagnosis not present

## 2022-09-09 DIAGNOSIS — M25611 Stiffness of right shoulder, not elsewhere classified: Secondary | ICD-10-CM | POA: Diagnosis not present

## 2022-09-09 DIAGNOSIS — M6281 Muscle weakness (generalized): Secondary | ICD-10-CM

## 2022-09-09 DIAGNOSIS — M5459 Other low back pain: Secondary | ICD-10-CM | POA: Diagnosis not present

## 2022-09-09 DIAGNOSIS — M25562 Pain in left knee: Secondary | ICD-10-CM | POA: Diagnosis not present

## 2022-09-09 NOTE — Therapy (Signed)
OUTPATIENT PHYSICAL THERAPY SHOULDER EVALUATION   Patient Name: Maria Bryan MRN: [id number] DOB:20-Mar-1976, 47 y.o., female Today's Date: 09/10/2022  END OF SESSION:  PT End of Session - 09/09/22 1628     Visit Number 13   1st visit for shoulder   Number of Visits 25    Date for PT Re-Evaluation 10/21/22   for shoulder   Authorization Type BCBS    PT Start Time 1520    PT Stop Time 1614    PT Time Calculation (min) 54 min    Activity Tolerance Patient tolerated treatment well    Behavior During Therapy WFL for tasks assessed/performed             Past Medical History:  Diagnosis Date   Hypertension    Obesity    Past Surgical History:  Procedure Laterality Date   ACHILLES TENDON REPAIR Left 2011   [place] SECTION     LAPAROSCOPIC GASTRIC SLEEVE RESECTION N/A 06/15/2018   Procedure: LAPAROSCOPIC GASTRIC SLEEVE RESECTION, UPPER ENDO, ERAS PATHWAY;  Surgeon: Gaynelle Adu, MD;  Location: [facility];  Service: General;  Laterality: N/A;   TUBAL LIGATION     Patient Active Problem List   Diagnosis Date Noted   Other intervertebral disc degeneration, lumbar region 09/26/2021   OSA (obstructive sleep apnea) 06/15/2018   Chronic lumbosacral pain 06/15/2018   Essential hypertension 05/24/2018   Heart palpitations 01/06/2018   Snoring 01/06/2018   Morbid obesity ([facility]) 01/06/2018    REFERRING PROVIDER: Juanell Fairly MD  REFERRING DIAG: M75.41 Impingement syndrome of Right shoulder  THERAPY DIAG:  Right shoulder pain, unspecified chronicity  Muscle weakness (generalized)  Stiffness of right shoulder, not elsewhere classified  Rationale for Evaluation and Treatment: Rehabilitation  ONSET DATE: 07/16/2022  SUBJECTIVE:                                                                                                                                                                                      SUBJECTIVE STATEMENT: Pt was performing some water  aerobics and some gym exercises at the [facility] 1 week prior to Thanksgiving. Pt began having clavicle/shoulder pain. She stopped going to the [facility] and performing water aerobics. Pt then picked her son up on 11/21 and had increased clavicle pain and swelling.  Pt went to ortho urgent care on 11/26 had an x ray.  She was informed no fractures, but does have some arthritis.  Pt received a steroid dosepak which helped for a few days, but then the pain returned.   Pt had a follow up on 12/6 and Dx indicated strain of R pectoral muscle and [place]/chest pain.  Pt rested  her shoulder and resumed PT for her back and knee.    Pt continued to have pain and swelling and returned to [facility] on 1/4.  Assessment indicated impingement vs pecoralis strain per MD note.  Pt was prescribed a medrol dosepak and just finished it.  Pt reports improved sx's with dosepak.  MD ordered PT and PT order indicated impingement syndrome.     Pt has pain lying on both sides and has disturbed sleep.  Pt has pain repetitively folding clothes and hanging up clothes.  Pt has some limitations with bathing.  Pt has pain and limitations with reaching into overhead cabinet.   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 Yes NPRS:  4/10 current, 8-9/10 worst, 1/10 best. Location:  R shoulder, sided cervical, R UT, R clavicle  PRECAUTIONS: Other: anterolisthesis L4-5  WEIGHT BEARING RESTRICTIONS: No  FALLS:  Has patient fallen in last 6 months? No   OCCUPATION: Attorney, sitting  PLOF: Independent; Pt was able to perform her ADLs and IADLs and reaching activities without R shoulder pain.   PATIENT GOALS: to improve pain and restore PLOF  NEXT MD VISIT:   OBJECTIVE:   DIAGNOSTIC FINDINGS:  X rays of R clavicle:  AC arthropathy and what appears to be [place] arthropathy per MD  note  PATIENT SURVEYS:  UEFI:  30/80  COGNITION: Overall cognitive status: Within functional limits for tasks assessed     Pt is R hand dominant  OBSERVATION: R [place] jt more prominent   UPPER EXTREMITY ROM:   Active ROM Right eval Left eval  Shoulder flexion 134 with pain 164  Shoulder scaption 152 with min pain 164  Shoulder abduction 118 with min pain 145  Shoulder adduction    Shoulder internal rotation 55 62  Shoulder external rotation 65 79  Elbow flexion    Elbow extension    Wrist flexion    Wrist extension    Wrist ulnar deviation    Wrist radial deviation    Wrist pronation    Wrist supination    (Blank rows = not tested)  UPPER EXTREMITY MMT:  MMT Right eval Left eval  Shoulder flexion 5/5 5/5  Shoulder scaption 5/5 5/5  Shoulder abduction 5/5 5/5  Shoulder adduction    Shoulder internal rotation 4+/5 5/5  Shoulder external rotation 4+/5 5/5  Middle trapezius    Lower trapezius    Elbow flexion    Elbow extension    Wrist flexion    Wrist extension    Wrist ulnar deviation    Wrist radial deviation    Wrist pronation    Wrist supination    Grip strength (lbs)    (Blank rows = not tested)  SHOULDER SPECIAL TESTS: Impingement tests:  Neer's: negative bilat Hawkin's [place]:  R:  positive, L: no pain in shoulder but had pain towards elbow Crossover Impingement:  R: positive, L: negative  PALPATION:  TTP:  R [place] jt, R AC jt, R clavicle, and R lateral shoulder. Pt has significant soft tissue tightness in R > L UT.     TODAY'S TREATMENT:  See below for pt education  PATIENT EDUCATION: Education details: Objective findings, POC, rationale of exercises, dx, and prognosis.  PT used shoulder model and explained to pt dx, relevant anatomy, and biomechanics of shoulder.  PT answered Pt's questions.  Person educated:  Patient Education method: Customer service manager Education comprehension: verbalized understanding  HOME EXERCISE PROGRAM: Will give at a later date.   ASSESSMENT:  CLINICAL IMPRESSION: Patient is a 47 y.o. female with a dx of impingement syndrome of R shoulder presenting to the clinic with R shoulder pain, limited ROM in R shoulder, and minimal weakness in R shoulder RTC.  MD note indicated impingement syndrome vs pectoralis strain.  Pt received a 2nd round of dosepak on 1/4 and is feeling better currently.  Pt is currently receiving PT for lumbar and knee.  She reports having disturbed sleep due to shoulder pain.  She has pain and limitations with ADLs and IADls including laundry and bathing.  Pt has pain with reaching overhead.  She had good strength t/o R shoulder with minimal strength deficits in R shoulder ER and IR.  Pt should benefit from skilled PT services to address impairments and improve overall function.   OBJECTIVE IMPAIRMENTS: decreased activity tolerance, decreased ROM, decreased strength, hypomobility, increased fascial restrictions, increased muscle spasms, impaired flexibility, impaired UE functional use, and pain.   ACTIVITY LIMITATIONS: lifting, bathing, dressing, reach over head, and hygiene/grooming  PARTICIPATION LIMITATIONS: cleaning and laundry  PERSONAL FACTORS:  lumbar pain and prior cervical pain  are also affecting patient's functional outcome.   REHAB POTENTIAL: Good  CLINICAL DECISION MAKING: Stable/uncomplicated  EVALUATION COMPLEXITY: Low   GOALS:   SHORT TERM GOALS: Target date: 09/30/2022   Pt will be independent and compliant with HEP for improved pain, strength, ROM, and function.   Baseline: Goal status: INITIAL  2.  Pt will demo improved R shoulder AROM by at least 10 deg in flex and abd for improved reaching.  Baseline:  Goal status: INITIAL  3.  Pt will report at least a 25% improvement in performing ADLs/IADLs.  Baseline:   Goal status: INITIAL  LONG TERM GOALS: Target date: 10/21/2022  Pt will be able to reach overheard into cabinet without significant pain and difficulty.  Baseline:  Goal status: INITIAL  2.  Pt will be able to do her laundry including folding clothes without significant shoulder pain and difficulty.   Baseline:  Goal status: INITIAL  3.  Pt will demo improved R shoulder RTC strength to 5/5 MMT for improved reaching and performance of ADLs/IADLs.  Baseline:  Goal status: INITIAL  4.  Pt will be able to perform her household chores without significant R shoulder pain.  Baseline:  Goal status: INITIAL    PLAN:  PT FREQUENCY: 2x/week  PT DURATION: 6 weeks  PLANNED INTERVENTIONS: Therapeutic exercises, Therapeutic activity, Neuromuscular re-education, Patient/Family education, Self Care, Joint mobilization, Aquatic Therapy, Dry Needling, Electrical stimulation, Cryotherapy, Moist heat, Taping, Ultrasound, Ionotophoresis 4mg /ml Dexamethasone, Manual therapy, and Re-evaluation  PLAN FOR NEXT SESSION: STM/IASTM to UT.  Progress scapular strength and stabilization per pt tolerance.  Perform supine serratus punches, supine shoulder ABC, S/L ER, and prone extension per pt tolerance.  Establish HEP.   Selinda Michaels III PT, DPT 09/10/22 7:49 PM

## 2022-09-10 ENCOUNTER — Encounter (HOSPITAL_BASED_OUTPATIENT_CLINIC_OR_DEPARTMENT_OTHER): Payer: Self-pay | Admitting: Physical Therapy

## 2022-09-18 ENCOUNTER — Encounter (HOSPITAL_BASED_OUTPATIENT_CLINIC_OR_DEPARTMENT_OTHER): Payer: Self-pay | Admitting: Physical Therapy

## 2022-09-18 ENCOUNTER — Ambulatory Visit (HOSPITAL_BASED_OUTPATIENT_CLINIC_OR_DEPARTMENT_OTHER): Payer: Federal, State, Local not specified - PPO | Admitting: Physical Therapy

## 2022-09-18 DIAGNOSIS — M25611 Stiffness of right shoulder, not elsewhere classified: Secondary | ICD-10-CM | POA: Diagnosis not present

## 2022-09-18 DIAGNOSIS — M25511 Pain in right shoulder: Secondary | ICD-10-CM

## 2022-09-18 DIAGNOSIS — M5459 Other low back pain: Secondary | ICD-10-CM | POA: Diagnosis not present

## 2022-09-18 DIAGNOSIS — M6281 Muscle weakness (generalized): Secondary | ICD-10-CM

## 2022-09-18 DIAGNOSIS — M25562 Pain in left knee: Secondary | ICD-10-CM | POA: Diagnosis not present

## 2022-09-18 NOTE — Therapy (Addendum)
OUTPATIENT PHYSICAL THERAPY TREATMENT  PHYSICAL THERAPY DISCHARGE SUMMARY  Visits from Start of Care: 14  Current functional level related to goals / functional outcomes: Indep   Remaining deficits: unknown   Education / Equipment: Management of condition/ HEP   Patient agrees to discharge. Patient goals were not met. Patient is being discharged due to not returning since the last visit.  Patient Name: Maria Bryan MRN: [id number] DOB:23-May-1976, 47 y.o., female Today's Date: 09/18/2022  END OF SESSION:  PT End of Session - 09/18/22 1609     Visit Number 14    Number of Visits 25    Date for PT Re-Evaluation 10/21/22   for shoulder   Authorization Type BCBS    PT Start Time 1607    PT Stop Time 1645    PT Time Calculation (min) 38 min    Activity Tolerance Patient tolerated treatment well    Behavior During Therapy WFL for tasks assessed/performed              Past Medical History:  Diagnosis Date   Hypertension    Obesity    Past Surgical History:  Procedure Laterality Date   ACHILLES TENDON REPAIR Left 2011   CESAREAN SECTION     LAPAROSCOPIC GASTRIC SLEEVE RESECTION N/A 06/15/2018   Procedure: LAPAROSCOPIC GASTRIC SLEEVE RESECTION, UPPER ENDO, ERAS PATHWAY;  Surgeon: Gaynelle Adu, MD;  Location: [facility];  Service: General;  Laterality: N/A;   TUBAL LIGATION     Patient Active Problem List   Diagnosis Date Noted   Other intervertebral disc degeneration, lumbar region 09/26/2021   OSA (obstructive sleep apnea) 06/15/2018   Chronic lumbosacral pain 06/15/2018   Essential hypertension 05/24/2018   Heart palpitations 01/06/2018   Snoring 01/06/2018   Morbid obesity (HCC) 01/06/2018    REFERRING PROVIDER: Juanell Fairly MD  REFERRING DIAG: M75.41 Impingement syndrome of Right shoulder  THERAPY DIAG:  Right shoulder pain, unspecified chronicity  Muscle weakness (generalized)  Stiffness of right shoulder, not elsewhere  classified  Other low back pain  Acute pain of left knee  Rationale for Evaluation and Treatment: Rehabilitation  ONSET DATE: 07/16/2022  SUBJECTIVE:                                                                                                                                                                                      SUBJECTIVE STATEMENT: 09/18/22: Pt states shoulder is feeling better. Pt slept on it a little last night. It has been hurting less during the day.   From prior visits: Pt was performing some water aerobics and some gym exercises at the [facility] 1 week prior to Thanksgiving. Pt  began having clavicle/shoulder pain. She stopped going to the [facility] and performing water aerobics. Pt then picked her son up on 11/21 and had increased clavicle pain and swelling.  Pt went to ortho urgent care on 11/26 had an x ray.  She was informed no fractures, but does have some arthritis.  Pt received a steroid dosepak which helped for a few days, but then the pain returned.   Pt had a follow up on 12/6 and Dx indicated strain of R pectoral muscle and [place]/chest pain.  Pt rested her shoulder and resumed PT for her back and knee.    Pt continued to have pain and swelling and returned to [facility] on 1/4.  Assessment indicated impingement vs pecoralis strain per MD note.  Pt was prescribed a medrol dosepak and just finished it.  Pt reports improved sx's with dosepak.  MD ordered PT and PT order indicated impingement syndrome.     Pt has pain lying on both sides and has disturbed sleep.  Pt has pain repetitively folding clothes and hanging up clothes.  Pt has some limitations with bathing.  Pt has pain and limitations with reaching into overhead cabinet.   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 Yes NPRS:  4/10 current, 8-9/10 worst,  1/10 best. Location:  R shoulder, sided cervical, R UT, R clavicle  PRECAUTIONS: Other: anterolisthesis L4-5  WEIGHT BEARING RESTRICTIONS: No  FALLS:  Has patient fallen in last 6 months? No  OCCUPATION: Attorney, sitting  PLOF: Independent; Pt was able to perform her ADLs and IADLs and reaching activities without R shoulder pain.   PATIENT GOALS: to improve pain and restore PLOF  NEXT MD VISIT:   OBJECTIVE:   DIAGNOSTIC FINDINGS:  X rays of R clavicle:  AC arthropathy and what appears to be [place] arthropathy per MD note  PATIENT SURVEYS:  UEFI:  30/80     Pt is R hand dominant  OBSERVATION: R Stanton jt more prominent   UPPER EXTREMITY ROM:   Active ROM Right eval Left eval  Shoulder flexion 134 with pain 164  Shoulder scaption 152 with min pain 164  Shoulder abduction 118 with min pain 145  Shoulder adduction    Shoulder internal rotation 55 62  Shoulder external rotation 65 79  Elbow flexion    Elbow extension    Wrist flexion    Wrist extension    Wrist ulnar deviation    Wrist radial deviation    Wrist pronation    Wrist supination    (Blank rows = not tested)  UPPER EXTREMITY MMT:  MMT Right eval Left eval  Shoulder flexion 5/5 5/5  Shoulder scaption 5/5 5/5  Shoulder abduction 5/5 5/5  Shoulder adduction    Shoulder internal rotation 4+/5 5/5  Shoulder external rotation 4+/5 5/5  Middle trapezius    Lower trapezius    Elbow flexion    Elbow extension    Wrist flexion    Wrist extension    Wrist ulnar deviation    Wrist radial deviation    Wrist pronation    Wrist supination    Grip strength (lbs)    (Blank rows = not tested)  SHOULDER SPECIAL TESTS: Impingement tests:  Neer's: negative bilat Hawkin's Kennedy:  R:  positive, L: no pain in shoulder but had pain towards elbow Crossover Impingement:  R: positive, L: negative  PALPATION:  TTP:  R Victoria jt, R AC jt, R clavicle, and R lateral shoulder. Pt has significant soft tissue tightness  in R > L UT.     TODAY'S TREATMENT:      09/18/22 THEREX Nustep L4 x 5 min S/L open/close book x10 Reactive iso row green TB 10x3 sec Shoulder ER red TB 10x3 Shoulder ext green TB 10x3 sec  MANUAL THERAPY STM, TPR and MFR R UT, cervical paraspinals, pecs   PATIENT EDUCATION: Education details: Objective findings, POC, rationale of exercises, dx, and prognosis.  PT used shoulder model and explained to pt dx, relevant anatomy, and biomechanics of shoulder.  PT answered Pt's questions.  Person educated: Patient Education method: Medical illustrator Education comprehension: verbalized understanding  HOME EXERCISE PROGRAM: Access Code: [email or web address] URL: [email or web address] Date: 09/18/2022 Prepared by: Vernon Prey April Kirstie Peri  Exercises - Sidelying Thoracic Rotation with Open Book  - 1 x daily - 7 x weekly - 1 sets - 10 reps - 5 sec hold - Standing Shoulder Row Reactive Isometric  - 1 x daily - 7 x weekly - 2 sets - 10 reps - 3 sec hold - Shoulder External Rotation with Anchored Resistance  - 1 x daily - 7 x weekly - 2 sets - 10 reps - Shoulder extension with resistance - Neutral  - 1 x daily - 7 x weekly - 2 sets - 10 reps  ASSESSMENT:  CLINICAL IMPRESSION: Pt with improving R shoulder pain. Treatment session focused on gentle stretching and scapular strengthening to reduce impingement.   OBJECTIVE IMPAIRMENTS: decreased activity tolerance, decreased ROM, decreased strength, hypomobility, increased fascial restrictions, increased muscle spasms, impaired flexibility, impaired UE functional use, and pain.    GOALS:   SHORT TERM GOALS: Target date: 09/30/2022   Pt will be independent and compliant with HEP for improved pain, strength, ROM, and function.   Baseline: Goal status: INITIAL  2.  Pt will demo improved R shoulder AROM by at least 10 deg in flex and abd for improved reaching.  Baseline:  Goal status: INITIAL  3.  Pt will report at least a  25% improvement in performing ADLs/IADLs.  Baseline:  Goal status: INITIAL  LONG TERM GOALS: Target date: 10/21/2022  Pt will be able to reach overheard into cabinet without significant pain and difficulty.  Baseline:  Goal status: INITIAL  2.  Pt will be able to do her laundry including folding clothes without significant shoulder pain and difficulty.   Baseline:  Goal status: INITIAL  3.  Pt will demo improved R shoulder RTC strength to 5/5 MMT for improved reaching and performance of ADLs/IADLs.  Baseline:  Goal status: INITIAL  4.  Pt will be able to perform her household chores without significant R shoulder pain.  Baseline:  Goal status: INITIAL    PLAN:  PT FREQUENCY: 2x/week  PT DURATION: 6 weeks  PLANNED INTERVENTIONS: Therapeutic exercises, Therapeutic activity, Neuromuscular re-education, Patient/Family education, Self Care, Joint mobilization, Aquatic Therapy, Dry Needling, Electrical stimulation, Cryotherapy, Moist heat, Taping, Ultrasound, Ionotophoresis 4mg /ml Dexamethasone, Manual therapy, and Re-evaluation  PLAN FOR NEXT SESSION: STM/IASTM to UT.  Progress scapular strength and stabilization per pt tolerance.  Perform supine serratus punches, supine shoulder ABC, S/L ER, and prone extension per pt tolerance.  Establish HEP.   Izaias Krupka April Dell Ponto, PT 09/18/22 4:10 PM  Addned: Rushie Chestnut) Ziemba MPT 01/22/23 [phone]

## 2022-09-23 ENCOUNTER — Ambulatory Visit (HOSPITAL_BASED_OUTPATIENT_CLINIC_OR_DEPARTMENT_OTHER): Payer: Federal, State, Local not specified - PPO | Admitting: Physical Therapy

## 2022-09-26 ENCOUNTER — Ambulatory Visit (HOSPITAL_BASED_OUTPATIENT_CLINIC_OR_DEPARTMENT_OTHER): Payer: Federal, State, Local not specified - PPO | Admitting: Physical Therapy

## 2022-10-08 ENCOUNTER — Ambulatory Visit (HOSPITAL_BASED_OUTPATIENT_CLINIC_OR_DEPARTMENT_OTHER): Payer: Federal, State, Local not specified - PPO | Admitting: Physical Therapy

## 2022-10-10 ENCOUNTER — Ambulatory Visit (HOSPITAL_BASED_OUTPATIENT_CLINIC_OR_DEPARTMENT_OTHER): Payer: Federal, State, Local not specified - PPO | Admitting: Physical Therapy

## 2022-10-14 ENCOUNTER — Ambulatory Visit (HOSPITAL_BASED_OUTPATIENT_CLINIC_OR_DEPARTMENT_OTHER): Payer: Federal, State, Local not specified - PPO | Attending: Orthopaedic Surgery | Admitting: Physical Therapy

## 2022-10-14 DIAGNOSIS — M5459 Other low back pain: Secondary | ICD-10-CM | POA: Insufficient documentation

## 2022-10-14 DIAGNOSIS — M6281 Muscle weakness (generalized): Secondary | ICD-10-CM | POA: Insufficient documentation

## 2022-10-14 DIAGNOSIS — M25562 Pain in left knee: Secondary | ICD-10-CM | POA: Insufficient documentation

## 2022-10-14 DIAGNOSIS — M25511 Pain in right shoulder: Secondary | ICD-10-CM | POA: Insufficient documentation

## 2022-10-14 DIAGNOSIS — M25611 Stiffness of right shoulder, not elsewhere classified: Secondary | ICD-10-CM | POA: Insufficient documentation

## 2022-10-17 ENCOUNTER — Ambulatory Visit (HOSPITAL_BASED_OUTPATIENT_CLINIC_OR_DEPARTMENT_OTHER): Payer: Federal, State, Local not specified - PPO | Admitting: Physical Therapy

## 2022-10-21 ENCOUNTER — Ambulatory Visit (HOSPITAL_BASED_OUTPATIENT_CLINIC_OR_DEPARTMENT_OTHER): Payer: Federal, State, Local not specified - PPO | Admitting: Physical Therapy

## 2022-10-24 ENCOUNTER — Encounter (HOSPITAL_BASED_OUTPATIENT_CLINIC_OR_DEPARTMENT_OTHER): Payer: Federal, State, Local not specified - PPO | Admitting: Physical Therapy

## 2023-03-10 ENCOUNTER — Other Ambulatory Visit: Payer: Self-pay | Admitting: Nurse Practitioner

## 2023-04-18 ENCOUNTER — Other Ambulatory Visit: Payer: Self-pay | Admitting: Nurse Practitioner

## 2023-04-24 DIAGNOSIS — K080 Exfoliation of teeth due to systemic causes: Secondary | ICD-10-CM | POA: Diagnosis not present

## 2023-04-29 NOTE — Progress Notes (Signed)
Madelaine Bhat, CMA,acting as a Neurosurgeon for Arnette Felts, FNP.,have documented all relevant documentation on the behalf of Arnette Felts, FNP,as directed by  Arnette Felts, FNP while in the presence of Arnette Felts, FNP.  Subjective:  Patient ID: Maria Bryan , female    DOB: April 07, 1976 , 47 y.o.   MRN: [id number]  Chief Complaint  Patient presents with   Hypertension    HPI  Patient presents today for BP check, Patient reports compliance with medications. Patient denies any Chest pain, SOB, and headaches. Patient reports she is having knee and back pain and would like to do physical therapy again. She went to aquatic PT and then transitioned back to land PT. She has been to [facility] in [place]. She did do some water aerobics but PT thought was too early. She has already had knee injections and steroid dose pack. She has pain when she bends her knee. She will have problems with clearing her foot/knee when walking at times.   BP Readings from Last 3 Encounters: 04/30/23 : 110/70 05/28/22 : 118/78 04/30/22 : (!) 144/80       Past Medical History:  Diagnosis Date   Hypertension    Obesity      Family History  Problem Relation Age of Onset   Diabetes Mother    Hypertension Mother    Peripheral Artery Disease Mother    Hypertension Father    Prostate cancer Father    Hypertension Sister    Diabetes Maternal Aunt    Hypertension Maternal Grandmother    Diabetes Maternal Aunt    Lung cancer Maternal Aunt      Current Outpatient Medications:    acetaminophen (TYLENOL) 500 MG tablet, Take 1,000 mg by mouth daily as needed for moderate pain or headache., Disp: , Rfl:    amLODipine (NORVASC) 10 MG tablet, TAKE 1 TABLET DAILY, Disp: 90 tablet, Rfl: 1   calcium carbonate (OS-CAL) 600 MG TABS tablet, Take by mouth., Disp: , Rfl:    Clindamycin-Benzoyl Per, Refr, gel, , Disp: , Rfl:    cyclobenzaprine (FLEXERIL) 10 MG tablet, Take 10 mg by mouth daily as needed for  muscle spasms., Disp: , Rfl:    Dapsone 7.5 % GEL, Apply 1 application topically daily as needed (acne). , Disp: , Rfl:    Fluocinolone Acetonide Scalp 0.01 % OIL, Apply 1 application topically every 14 (fourteen) days., Disp: , Rfl:    fluocinonide ointment (LIDEX) 0.05 %, Apply 1 application topically every other day., Disp: , Rfl:    hydrocortisone 2.5 % cream, Apply 1 application topically every other day. , Disp: , Rfl:    labetalol (NORMODYNE) 200 MG tablet, TAKE 1 TABLET BY MOUTH TWICE A DAY, Disp: 180 tablet, Rfl: 0   Multiple Vitamins-Minerals (OPURITY PO), Take 1 capsule by mouth., Disp: , Rfl:    PRESCRIPTION MEDICATION, Apply 1 application topically daily as needed (pain). diclofenac 3% baclofen 2% lidocaine 5% Menthol 1%, Disp: , Rfl:    tretinoin (RETIN-A) 0.025 % cream, Apply 1 application topically at bedtime., Disp: , Rfl:    Allergies  Allergen Reactions   Nsaids      Review of Systems  Constitutional: Negative.   HENT: Negative.    Eyes: Negative.   Respiratory: Negative.    Cardiovascular: Negative.   Gastrointestinal: Negative.   Musculoskeletal:  Positive for arthralgias (left knee pain, wears a knee brace at times) and back pain.  Psychiatric/Behavioral: Negative.       Today's  Vitals   04/30/23 1557  BP: 110/70  Pulse: 71  Temp: 98.1 F (36.7 C)  TempSrc: Oral  Weight: 232 lb (105.2 kg)  Height: 5\' 5"  (1.651 m)  PainSc: 0-No pain   Body mass index is 38.61 kg/m.  Wt Readings from Last 3 Encounters:  04/30/23 232 lb (105.2 kg)  04/30/22 229 lb 12.8 oz (104.2 kg)  09/26/21 215 lb (97.5 kg)     Objective:  Physical Exam Vitals reviewed.  Constitutional:      General: She is not in acute distress.    Appearance: Normal appearance. She is well-developed. She is obese.  HENT:     Head: Normocephalic and atraumatic.  Eyes:     Pupils: Pupils are equal, round, and reactive to light.  Cardiovascular:     Rate and Rhythm: Normal rate and regular  rhythm.     Pulses: Normal pulses.     Heart sounds: Normal heart sounds. No murmur heard. Pulmonary:     Effort: Pulmonary effort is normal. No respiratory distress.     Breath sounds: Normal breath sounds. No wheezing.  Skin:    General: Skin is warm and dry.     Capillary Refill: Capillary refill takes less than 2 seconds.  Neurological:     General: No focal deficit present.     Mental Status: She is alert and oriented to person, place, and time.     Cranial Nerves: No cranial nerve deficit.  Psychiatric:        Mood and Affect: Mood normal.        Behavior: Behavior normal.        Thought Content: Thought content normal.        Judgment: Judgment normal.         Assessment And Plan:  Essential hypertension Assessment & Plan: Blood pressure is well controlled, continue current medications  Orders: -     Basic metabolic panel  Chronic pain of left knee Assessment & Plan: This has been ongoing and has had knee injections and steroids. Will refer to PT for water aerobics  Orders: -     DG Knee Complete 4 Views Left; Future -     Ambulatory referral to Orthopedic Surgery  Chronic lumbosacral pain Assessment & Plan: This is worsening and affecting her daily lifestyle. Will refer to orthopedics   Class 2 obesity due to excess calories with body mass index (BMI) of 38.0 to 38.9 in adult, unspecified whether serious comorbidity present Assessment & Plan: She is encouraged to strive for BMI less than 30 to decrease cardiac risk. Advised to aim for at least 150 minutes of exercise per week.    Need for influenza vaccination Assessment & Plan: Influenza vaccine administered Encouraged to take Tylenol as needed for fever or muscle aches.   Orders: -     Flu vaccine trivalent PF, 6mos and older(Flulaval,Afluria,Fluarix,Fluzone)    Return for keep same next appt. .  Patient was given opportunity to ask questions. Patient verbalized understanding of the plan and was  able to repeat key elements of the plan. All questions were answered to their satisfaction.    Jeanell Sparrow, FNP, have reviewed all documentation for this visit. The documentation on 04/30/23 for the exam, diagnosis, procedures, and orders are all accurate and complete.   IF YOU HAVE BEEN REFERRED TO A SPECIALIST, IT MAY TAKE 1-2 WEEKS TO SCHEDULE/PROCESS THE REFERRAL. IF YOU HAVE NOT HEARD FROM US/SPECIALIST IN TWO WEEKS, PLEASE GIVE [place] A  CALL AT [phone] X [phone].

## 2023-04-30 ENCOUNTER — Encounter: Payer: Self-pay | Admitting: Nurse Practitioner

## 2023-04-30 ENCOUNTER — Other Ambulatory Visit: Payer: Self-pay | Admitting: Nurse Practitioner

## 2023-04-30 ENCOUNTER — Ambulatory Visit: Payer: Federal, State, Local not specified - PPO | Admitting: Nurse Practitioner

## 2023-04-30 VITALS — BP 110/70 | HR 71 | Temp 98.1°F | Ht 65.0 in | Wt 232.0 lb

## 2023-04-30 DIAGNOSIS — Z23 Encounter for immunization: Secondary | ICD-10-CM | POA: Insufficient documentation

## 2023-04-30 DIAGNOSIS — M545 Low back pain, unspecified: Secondary | ICD-10-CM | POA: Diagnosis not present

## 2023-04-30 DIAGNOSIS — G8929 Other chronic pain: Secondary | ICD-10-CM | POA: Insufficient documentation

## 2023-04-30 DIAGNOSIS — M25562 Pain in left knee: Secondary | ICD-10-CM | POA: Diagnosis not present

## 2023-04-30 DIAGNOSIS — E6609 Other obesity due to excess calories: Secondary | ICD-10-CM

## 2023-04-30 DIAGNOSIS — I10 Essential (primary) hypertension: Secondary | ICD-10-CM | POA: Diagnosis not present

## 2023-04-30 DIAGNOSIS — Z6838 Body mass index (BMI) 38.0-38.9, adult: Secondary | ICD-10-CM

## 2023-04-30 NOTE — Patient Instructions (Signed)
You can go to [address] for an xray of your left knee.

## 2023-05-01 LAB — BASIC METABOLIC PANEL
BUN/Creatinine Ratio: 11 (ref 9–23)
BUN: 8 mg/dL (ref 6–24)
CO2: 21 mmol/L (ref 20–29)
Calcium: 9 mg/dL (ref 8.7–10.2)
Chloride: 103 mmol/L (ref 96–106)
Creatinine, Ser: 0.74 mg/dL (ref 0.57–1.00)
Glucose: 87 mg/dL (ref 70–99)
Potassium: 4.1 mmol/L (ref 3.5–5.2)
Sodium: 137 mmol/L (ref 134–144)
eGFR: 100 mL/min/{1.73_m2} (ref >59)

## 2023-05-18 NOTE — Assessment & Plan Note (Signed)
This has been ongoing and has had knee injections and steroids. Will refer to PT for water aerobics

## 2023-05-18 NOTE — Assessment & Plan Note (Signed)
She is encouraged to strive for BMI less than 30 to decrease cardiac risk. Advised to aim for at least 150 minutes of exercise per week.  

## 2023-05-18 NOTE — Assessment & Plan Note (Signed)
Influenza vaccine administered Encouraged to take Tylenol as needed for fever or muscle aches.

## 2023-05-18 NOTE — Assessment & Plan Note (Signed)
This is worsening and affecting her daily lifestyle. Will refer to orthopedics

## 2023-05-18 NOTE — Assessment & Plan Note (Signed)
Blood pressure is well controlled, continue current medications.

## 2023-05-27 ENCOUNTER — Ambulatory Visit: Payer: Federal, State, Local not specified - PPO | Admitting: Orthopaedic Surgery

## 2023-05-29 ENCOUNTER — Telehealth: Payer: Self-pay

## 2023-05-29 ENCOUNTER — Encounter: Payer: Self-pay | Admitting: Orthopaedic Surgery

## 2023-05-29 ENCOUNTER — Other Ambulatory Visit (INDEPENDENT_AMBULATORY_CARE_PROVIDER_SITE_OTHER): Payer: Self-pay

## 2023-05-29 ENCOUNTER — Ambulatory Visit: Payer: Federal, State, Local not specified - PPO | Admitting: Orthopaedic Surgery

## 2023-05-29 DIAGNOSIS — M25562 Pain in left knee: Secondary | ICD-10-CM

## 2023-05-29 DIAGNOSIS — G8929 Other chronic pain: Secondary | ICD-10-CM

## 2023-05-29 NOTE — Progress Notes (Signed)
Office Visit Note   Patient: Maria Bryan           Date of Birth: 10-10-1975           MRN: [id number] Visit Date: 05/29/2023              Requested by: Arnette Felts, FNP [address] [place],  [place] [address] PCP: Arnette Felts, FNP   Assessment & Plan: Visit Diagnoses:  1. Chronic pain of left knee     Plan: Patient is a 47 year old female with symptomatic patellofemoral osteoarthritis.  Treatment options were discussed.  Will get authorization for viscosupplementation.  She will also use a knee brace and Voltaren gel.  She would prefer to follow-up with me for the knee.  Follow-Up Instructions: No follow-ups on file.   Orders:  Orders Placed This Encounter  Procedures   XR KNEE 3 VIEW LEFT   No orders of the defined types were placed in this encounter.     Procedures: No procedures performed   Clinical Data: No additional findings.   Subjective: Chief Complaint  Patient presents with   Left Knee - Pain    HPI Patient is a 47 year old female here for evaluation of left knee pain for about a year.  Feels tightness and pain inferior to the patella.  She cannot take NSAIDs due to history of bariatric surgery.  She has seen Dr. Ophelia Charter in the past for her back issues.  She had a cortisone injection about a year ago at an urgent care in [place] which she states provided minimal relief.  Review of Systems  Constitutional: Negative.   HENT: Negative.    Eyes: Negative.   Respiratory: Negative.    Cardiovascular: Negative.   Endocrine: Negative.   Musculoskeletal: Negative.   Neurological: Negative.   Hematological: Negative.   Psychiatric/Behavioral: Negative.    All other systems reviewed and are negative.    Objective: Vital Signs: LMP 04/15/2023   Physical Exam Vitals and nursing note reviewed.  Constitutional:      Appearance: She is well-developed.  HENT:     Head: Atraumatic.     Nose: Nose normal.  Eyes:      Extraocular Movements: Extraocular movements intact.  Cardiovascular:     Pulses: Normal pulses.  Pulmonary:     Effort: Pulmonary effort is normal.  Abdominal:     Palpations: Abdomen is soft.  Musculoskeletal:     Cervical back: Neck supple.  Skin:    General: Skin is warm.     Capillary Refill: Capillary refill takes less than 2 seconds.  Neurological:     Mental Status: She is alert. Mental status is at baseline.  Psychiatric:        Behavior: Behavior normal.        Thought Content: Thought content normal.        Judgment: Judgment normal.     Ortho Exam Exam of the left knee shows no joint effusion or joint line tenderness.  Minor patellofemoral crepitus with range of motion.  Collaterals and cruciates are stable. Specialty Comments:  No specialty comments available.  Imaging: No results found.   PMFS History: Patient Active Problem List   Diagnosis Date Noted   Chronic pain of left knee 04/30/2023   Class 2 obesity due to excess calories with body mass index (BMI) of 38.0 to 38.9 in adult 04/30/2023   Need for influenza vaccination 04/30/2023   Other intervertebral disc degeneration, lumbar region 09/26/2021  OSA (obstructive sleep apnea) 06/15/2018   Chronic lumbosacral pain 06/15/2018   Essential hypertension 05/24/2018   Heart palpitations 01/06/2018   Snoring 01/06/2018   Morbid obesity (HCC) 01/06/2018   Past Medical History:  Diagnosis Date   Hypertension    Obesity     Family History  Problem Relation Age of Onset   Diabetes Mother    Hypertension Mother    Peripheral Artery Disease Mother    Hypertension Father    Prostate cancer Father    Hypertension Sister    Diabetes Maternal Aunt    Hypertension Maternal Grandmother    Diabetes Maternal Aunt    Lung cancer Maternal Aunt     Past Surgical History:  Procedure Laterality Date   ACHILLES TENDON REPAIR Left 2011   CESAREAN SECTION     LAPAROSCOPIC GASTRIC SLEEVE RESECTION N/A  06/15/2018   Procedure: LAPAROSCOPIC GASTRIC SLEEVE RESECTION, UPPER ENDO, ERAS PATHWAY;  Surgeon: Gaynelle Adu, MD;  Location: [facility];  Service: General;  Laterality: N/A;   TUBAL LIGATION     Social History   Occupational History   Not on file  Tobacco Use   Smoking status: Never   Smokeless tobacco: Never  Vaping Use   Vaping status: Never Used  Substance and Sexual Activity   Alcohol use: Yes    Comment: ocasionally   Drug use: Not Currently   Sexual activity: Yes

## 2023-05-29 NOTE — Telephone Encounter (Signed)
VOB submitted for Monovisc, left knee 

## 2023-05-29 NOTE — Telephone Encounter (Signed)
Please precert for left knee visco. Dr.Xu's patient.

## 2023-06-09 ENCOUNTER — Ambulatory Visit: Payer: Federal, State, Local not specified - PPO | Attending: Orthopaedic Surgery

## 2023-07-01 ENCOUNTER — Telehealth: Payer: Self-pay

## 2023-07-01 NOTE — Telephone Encounter (Signed)
PA still pending for gel injection through [employer] due to having injection changed to preferred location.

## 2023-07-15 NOTE — Progress Notes (Signed)
Madelaine Bhat, CMA,acting as a Neurosurgeon for Arnette Felts, FNP.,have documented all relevant documentation on the behalf of Arnette Felts, FNP,as directed by  Arnette Felts, FNP while in the presence of Arnette Felts, FNP.  Subjective:    Patient ID: Maria Bryan , female    DOB: 05-29-1976 , 47 y.o.   MRN: [id number]  Chief Complaint  Patient presents with   Annual Exam         HPI  Patient presents today for HM, Patient reports compliance with medication. Patient denies any chest pain, SOB, or headaches. Patient has no concerns today. She is followed with [facility] Dr. Connye Burkitt.      Past Medical History:  Diagnosis Date   Hypertension    Obesity      Family History  Problem Relation Age of Onset   Diabetes Mother    Hypertension Mother    Peripheral Artery Disease Mother    Hypertension Father    Prostate cancer Father    Hypertension Sister    Diabetes Maternal Aunt    Hypertension Maternal Grandmother    Diabetes Maternal Aunt    Lung cancer Maternal Aunt      Current Outpatient Medications:    acetaminophen (TYLENOL) 500 MG tablet, Take 1,000 mg by mouth daily as needed for moderate pain or headache., Disp: , Rfl:    amLODipine (NORVASC) 10 MG tablet, TAKE 1 TABLET DAILY, Disp: 90 tablet, Rfl: 1   calcium carbonate (OS-CAL) 600 MG TABS tablet, Take by mouth., Disp: , Rfl:    Clindamycin-Benzoyl Per, Refr, gel, , Disp: , Rfl:    cyclobenzaprine (FLEXERIL) 10 MG tablet, Take 10 mg by mouth daily as needed for muscle spasms., Disp: , Rfl:    Dapsone 7.5 % GEL, Apply 1 application topically daily as needed (acne). , Disp: , Rfl:    Fluocinolone Acetonide Scalp 0.01 % OIL, Apply 1 application topically every 14 (fourteen) days., Disp: , Rfl:    fluocinonide ointment (LIDEX) 0.05 %, Apply 1 application topically every other day., Disp: , Rfl:    hydrocortisone 2.5 % cream, Apply 1 application topically every other day. , Disp: , Rfl:    labetalol (NORMODYNE)  200 MG tablet, TAKE 1 TABLET BY MOUTH TWICE A DAY, Disp: 180 tablet, Rfl: 0   Multiple Vitamins-Minerals (OPURITY PO), Take 1 capsule by mouth., Disp: , Rfl:    PRESCRIPTION MEDICATION, Apply 1 application topically daily as needed (pain). diclofenac 3% baclofen 2% lidocaine 5% Menthol 1%, Disp: , Rfl:    tretinoin (RETIN-A) 0.025 % cream, Apply 1 application topically at bedtime., Disp: , Rfl:    Allergies  Allergen Reactions   Nsaids       The patient states she uses tubal ligation for birth control. Patient's last menstrual period was 07/05/2023.  Negative for Dysmenorrhea and Negative for Menorrhagia. Negative for: breast discharge, breast lump(s), breast pain and breast self exam. Associated symptoms include abnormal vaginal bleeding. Pertinent negatives include abnormal bleeding (hematology), anxiety, decreased libido, depression, difficulty falling sleep, dyspareunia, history of infertility, nocturia, sexual dysfunction, sleep disturbances, urinary incontinence, urinary urgency, vaginal discharge and vaginal itching. Diet regular; feels like she is eating well and limiting her bread. Admits need to increase her water intake. The patient states her exercise level is minimal - will ride her bike with the children. Her left knee has been seen by Orthopedics and she needs to f/u. Admits she needs to do more. Her lowest weight was 205 lbs a  few years ago   The patient's tobacco use is:  Social History   Tobacco Use  Smoking Status Never  Smokeless Tobacco Never   She has been exposed to passive smoke. The patient's alcohol use is:  Social History   Substance and Sexual Activity  Alcohol Use Yes   Comment: ocasionally  . Additional information: Last pap 08/21/2022, next one scheduled for 08/21/2025.    Review of Systems  Constitutional: Negative.   HENT: Negative.    Eyes: Negative.   Respiratory: Negative.    Cardiovascular: Negative.   Gastrointestinal: Negative.   Endocrine:  Negative.   Genitourinary: Negative.   Musculoskeletal: Negative.   Skin: Negative.   Allergic/Immunologic: Negative.   Neurological: Negative.   Hematological: Negative.   Psychiatric/Behavioral: Negative.       Today's Vitals   07/16/23 1514  BP: 120/74  Pulse: 85  Temp: 98 F (36.7 C)  TempSrc: Oral  Weight: 229 lb 12.8 oz (104.2 kg)  Height: 5\' 5"  (1.651 m)  PainSc: 0-No pain   Body mass index is 38.24 kg/m.  Wt Readings from Last 3 Encounters:  07/16/23 229 lb 12.8 oz (104.2 kg)  04/30/23 232 lb (105.2 kg)  04/30/22 229 lb 12.8 oz (104.2 kg)     Objective:  Physical Exam Vitals and nursing note reviewed.  Constitutional:      Appearance: Normal appearance. She is obese.  HENT:     Head: Normocephalic and atraumatic.     Right Ear: Tympanic membrane, ear canal and external ear normal. There is no impacted cerumen.     Left Ear: Tympanic membrane, ear canal and external ear normal. There is no impacted cerumen.     Nose: Nose normal.     Mouth/Throat:     Mouth: Mucous membranes are moist.  Eyes:     Extraocular Movements: Extraocular movements intact.     Conjunctiva/sclera: Conjunctivae normal.     Pupils: Pupils are equal, round, and reactive to light.  Cardiovascular:     Rate and Rhythm: Normal rate and regular rhythm.     Pulses: Normal pulses.     Heart sounds: Normal heart sounds. No murmur heard. Pulmonary:     Effort: Pulmonary effort is normal. No respiratory distress.     Breath sounds: Normal breath sounds. No stridor. No wheezing.  Chest:  Breasts:    Tanner Score is 5.     Right: Normal.     Left: Normal.  Abdominal:     General: Abdomen is flat. Bowel sounds are normal. There is no distension.     Palpations: Abdomen is soft.  Genitourinary:    Comments: Deferred followed by GYN Musculoskeletal:        General: No tenderness. Normal range of motion.     Cervical back: Normal range of motion and neck supple.  Skin:    General: Skin  is warm and dry.     Capillary Refill: Capillary refill takes less than 2 seconds.  Neurological:     General: No focal deficit present.     Mental Status: She is alert and oriented to person, place, and time.     Cranial Nerves: No cranial nerve deficit.     Motor: No weakness.  Psychiatric:        Mood and Affect: Mood normal.        Behavior: Behavior normal.         Assessment And Plan:     Encounter for annual health examination Assessment &  Plan: Behavior modifications discussed and diet history reviewed.   Pt will continue to exercise regularly and modify diet with low GI, plant based foods and decrease intake of processed foods.  Recommend intake of daily multivitamin, Vitamin D, and calcium.  Recommend mammogram and colonoscopy for preventive screenings, as well as recommend immunizations that include influenza, TDAP    Class 2 severe obesity due to excess calories with serious comorbidity and body mass index (BMI) of 38.0 to 38.9 in adult [facility]) Assessment & Plan: She is encouraged to strive for BMI less than 30 to decrease cardiac risk. Advised to aim for at least 150 minutes of exercise per week.   Orders: -     Hemoglobin A1c  Essential hypertension Assessment & Plan: Blood pressure is well controlled, continue current medications. EKG done with nonspecific t abnormality, HR 72  Orders: -     EKG 12-Lead -     POCT URINALYSIS DIP (CLINITEK) -     Microalbumin / creatinine urine ratio -     CMP14+EGFR  Vitamin D deficiency Assessment & Plan: Will check vitamin D level and supplement as needed.    Also encouraged to spend 15 minutes in the sun daily.     Elevated LDL cholesterol level Assessment & Plan: Cholesterol levels were slightly elevated at last visit. Will check lipid panel today. Continue to follow a low fat diet  Orders: -     Lipid panel  H/O gastric sleeve Assessment & Plan: Will check her blood levels to make sure not deficient with  her vitamins and iron  Orders: -     Vitamin B12 -     VITAMIN D 25 Hydroxy (Vit-D Deficiency, Fractures) -     Iron, TIBC and Ferritin Panel  COVID-19 vaccine administered Assessment & Plan: Covid 19 vaccine given in office observed for 15 minutes without any adverse reaction   Orders: -     Pfizer Comirnaty Covid-19 Vaccine 12yrs & older  Other long term (current) drug therapy -     CBC with Differential/Platelet    Return for 1 year physical, 6 month bp check. Patient was given opportunity to ask questions. Patient verbalized understanding of the plan and was able to repeat key elements of the plan. All questions were answered to their satisfaction.   Arnette Felts, FNP  I, Arnette Felts, FNP, have reviewed all documentation for this visit. The documentation on 07/16/23 for the exam, diagnosis, procedures, and orders are all accurate and complete.

## 2023-07-16 ENCOUNTER — Encounter: Payer: Self-pay | Admitting: Nurse Practitioner

## 2023-07-16 ENCOUNTER — Ambulatory Visit: Payer: Self-pay | Admitting: Nurse Practitioner

## 2023-07-16 VITALS — BP 120/74 | HR 85 | Temp 98.0°F | Ht 65.0 in | Wt 229.8 lb

## 2023-07-16 DIAGNOSIS — Z903 Acquired absence of stomach [part of]: Secondary | ICD-10-CM

## 2023-07-16 DIAGNOSIS — Z6838 Body mass index (BMI) 38.0-38.9, adult: Secondary | ICD-10-CM

## 2023-07-16 DIAGNOSIS — I10 Essential (primary) hypertension: Secondary | ICD-10-CM

## 2023-07-16 DIAGNOSIS — Z0000 Encounter for general adult medical examination without abnormal findings: Secondary | ICD-10-CM | POA: Diagnosis not present

## 2023-07-16 DIAGNOSIS — Z23 Encounter for immunization: Secondary | ICD-10-CM

## 2023-07-16 DIAGNOSIS — Z79899 Other long term (current) drug therapy: Secondary | ICD-10-CM | POA: Diagnosis not present

## 2023-07-16 DIAGNOSIS — E66812 Obesity, class 2: Secondary | ICD-10-CM | POA: Diagnosis not present

## 2023-07-16 DIAGNOSIS — E7800 Pure hypercholesterolemia, unspecified: Secondary | ICD-10-CM

## 2023-07-16 DIAGNOSIS — E559 Vitamin D deficiency, unspecified: Secondary | ICD-10-CM

## 2023-07-16 LAB — POCT URINALYSIS DIP (CLINITEK)
Blood, UA: NEGATIVE
Glucose, UA: NEGATIVE mg/dL
Ketones, POC UA: NEGATIVE mg/dL
Leukocytes, UA: NEGATIVE
Nitrite, UA: NEGATIVE
POC PROTEIN,UA: NEGATIVE
Spec Grav, UA: >=1.03 — AB (ref 1.010–1.025)
Urobilinogen, UA: 0.2 U/dL
pH, UA: 6.5 (ref 5.0–8.0)

## 2023-07-17 LAB — CBC WITH DIFFERENTIAL/PLATELET
Basophils Absolute: 0 10*3/uL (ref 0.0–0.2)
Basos: 0 %
EOS (ABSOLUTE): 0.1 10*3/uL (ref 0.0–0.4)
Eos: 2 %
Hematocrit: 33.1 % — ABNORMAL LOW (ref 34.0–46.6)
Hemoglobin: 10.6 g/dL — ABNORMAL LOW (ref 11.1–15.9)
Immature Grans (Abs): 0 10*3/uL (ref 0.0–0.1)
Immature Granulocytes: 0 %
Lymphocytes Absolute: 2.8 10*3/uL (ref 0.7–3.1)
Lymphs: 51 %
MCH: 27.2 pg (ref 26.6–33.0)
MCHC: 32 g/dL (ref 31.5–35.7)
MCV: 85 fL (ref 79–97)
Monocytes Absolute: 0.5 10*3/uL (ref 0.1–0.9)
Monocytes: 9 %
Neutrophils Absolute: 2.1 10*3/uL (ref 1.4–7.0)
Neutrophils: 38 %
Platelets: 357 10*3/uL (ref 150–450)
RBC: 3.9 x10E6/uL (ref 3.77–5.28)
RDW: 14.9 % (ref 11.7–15.4)
WBC: 5.6 10*3/uL (ref 3.4–10.8)

## 2023-07-17 LAB — HEMOGLOBIN A1C
Est. average glucose Bld gHb Est-mCnc: 108 mg/dL
Hgb A1c MFr Bld: 5.4 % (ref 4.8–5.6)

## 2023-07-17 LAB — VITAMIN D 25 HYDROXY (VIT D DEFICIENCY, FRACTURES): Vit D, 25-Hydroxy: 26.7 ng/mL — ABNORMAL LOW (ref 30.0–100.0)

## 2023-07-17 LAB — IRON,TIBC AND FERRITIN PANEL
Ferritin: 9 ng/mL — ABNORMAL LOW (ref 15–150)
Iron Saturation: 6 % — CL (ref 15–55)
Iron: 30 ug/dL (ref 27–159)
Total Iron Binding Capacity: 529 ug/dL — ABNORMAL HIGH (ref 250–450)
UIBC: 499 ug/dL — ABNORMAL HIGH (ref 131–425)

## 2023-07-17 LAB — LIPID PANEL
Chol/HDL Ratio: 2.7 ratio (ref 0.0–4.4)
Cholesterol, Total: 243 mg/dL — ABNORMAL HIGH (ref 100–199)
HDL: 90 mg/dL (ref >39)
LDL Chol Calc (NIH): 135 mg/dL — ABNORMAL HIGH (ref 0–99)
Triglycerides: 106 mg/dL (ref 0–149)
VLDL Cholesterol Cal: 18 mg/dL (ref 5–40)

## 2023-07-17 LAB — CMP14+EGFR
ALT: 11 [IU]/L (ref 0–32)
AST: 21 [IU]/L (ref 0–40)
Albumin: 4.4 g/dL (ref 3.9–4.9)
Alkaline Phosphatase: 97 [IU]/L (ref 44–121)
BUN/Creatinine Ratio: 16 (ref 9–23)
BUN: 12 mg/dL (ref 6–24)
Bilirubin Total: 0.2 mg/dL (ref 0.0–1.2)
CO2: 22 mmol/L (ref 20–29)
Calcium: 9.5 mg/dL (ref 8.7–10.2)
Chloride: 102 mmol/L (ref 96–106)
Creatinine, Ser: 0.76 mg/dL (ref 0.57–1.00)
Globulin, Total: 2.9 g/dL (ref 1.5–4.5)
Glucose: 93 mg/dL (ref 70–99)
Potassium: 4.2 mmol/L (ref 3.5–5.2)
Sodium: 138 mmol/L (ref 134–144)
Total Protein: 7.3 g/dL (ref 6.0–8.5)
eGFR: 96 mL/min/{1.73_m2} (ref >59)

## 2023-07-17 LAB — MICROALBUMIN / CREATININE URINE RATIO
Creatinine, Urine: 200.3 mg/dL
Microalb/Creat Ratio: 3 mg/g{creat} (ref 0–29)
Microalbumin, Urine: 6.7 ug/mL

## 2023-07-17 LAB — VITAMIN B12: Vitamin B-12: 432 pg/mL (ref 232–1245)

## 2023-07-18 ENCOUNTER — Telehealth: Payer: Self-pay | Admitting: Orthopaedic Surgery

## 2023-07-18 NOTE — Telephone Encounter (Signed)
Pt is getting medication for injection shipped to our address from special[facility], address was confirmed

## 2023-07-18 NOTE — Telephone Encounter (Signed)
Noted.  Once medication is received, I will call patient to schedule for gel injections.

## 2023-07-19 ENCOUNTER — Encounter: Payer: Self-pay | Admitting: Nurse Practitioner

## 2023-07-21 DIAGNOSIS — Z23 Encounter for immunization: Secondary | ICD-10-CM | POA: Insufficient documentation

## 2023-07-21 DIAGNOSIS — Z0000 Encounter for general adult medical examination without abnormal findings: Secondary | ICD-10-CM | POA: Insufficient documentation

## 2023-07-21 DIAGNOSIS — E559 Vitamin D deficiency, unspecified: Secondary | ICD-10-CM | POA: Insufficient documentation

## 2023-07-21 DIAGNOSIS — Z903 Acquired absence of stomach [part of]: Secondary | ICD-10-CM | POA: Insufficient documentation

## 2023-07-21 DIAGNOSIS — E7800 Pure hypercholesterolemia, unspecified: Secondary | ICD-10-CM | POA: Insufficient documentation

## 2023-07-21 NOTE — Assessment & Plan Note (Signed)
Cholesterol levels were slightly elevated at last visit. Will check lipid panel today. Continue to follow a low fat diet

## 2023-07-21 NOTE — Assessment & Plan Note (Signed)
Will check her blood levels to make sure not deficient with her vitamins and iron

## 2023-07-21 NOTE — Assessment & Plan Note (Signed)
Covid 19 vaccine given in office observed for 15 minutes without any adverse reaction  

## 2023-07-21 NOTE — Assessment & Plan Note (Signed)
Will check vitamin D level and supplement as needed.    Also encouraged to spend 15 minutes in the sun daily.   

## 2023-07-21 NOTE — Assessment & Plan Note (Addendum)
Blood pressure is well controlled, continue current medications. EKG done with nonspecific t abnormality, HR 72

## 2023-07-21 NOTE — Assessment & Plan Note (Signed)

## 2023-07-21 NOTE — Assessment & Plan Note (Signed)
She is encouraged to strive for BMI less than 30 to decrease cardiac risk. Advised to aim for at least 150 minutes of exercise per week.

## 2023-07-23 ENCOUNTER — Other Ambulatory Visit: Payer: Self-pay

## 2023-07-23 DIAGNOSIS — G8929 Other chronic pain: Secondary | ICD-10-CM

## 2023-07-26 ENCOUNTER — Other Ambulatory Visit: Payer: Self-pay | Admitting: Nurse Practitioner

## 2023-07-27 ENCOUNTER — Other Ambulatory Visit: Payer: Self-pay | Admitting: Nurse Practitioner

## 2023-08-04 DIAGNOSIS — Z1231 Encounter for screening mammogram for malignant neoplasm of breast: Secondary | ICD-10-CM | POA: Diagnosis not present

## 2023-08-04 LAB — HM MAMMOGRAPHY

## 2023-08-05 ENCOUNTER — Encounter: Payer: Self-pay | Admitting: Internal Medicine

## 2023-08-05 ENCOUNTER — Other Ambulatory Visit: Payer: Self-pay

## 2023-08-07 ENCOUNTER — Encounter: Payer: Self-pay | Admitting: Orthopaedic Surgery

## 2023-08-07 ENCOUNTER — Ambulatory Visit: Payer: Federal, State, Local not specified - PPO | Admitting: Orthopaedic Surgery

## 2023-08-07 DIAGNOSIS — M1712 Unilateral primary osteoarthritis, left knee: Secondary | ICD-10-CM

## 2023-08-07 MED ORDER — FLUOCINONIDE 0.05 % EX OINT: 1.0000 | TOPICAL_OINTMENT | CUTANEOUS | 1 refills | Status: AC

## 2023-08-07 MED ORDER — TRAMADOL HCL 50 MG PO TABS: 50.0000 mg | ORAL_TABLET | Freq: Two times a day (BID) | ORAL | 1 refills | Status: AC | PRN

## 2023-08-07 NOTE — Progress Notes (Signed)
Office Visit Note   Patient: Maria Bryan           Date of Birth: 05-Mar-1976           MRN: [id number] Visit Date: 08/07/2023              Requested by: Arnette Felts, FNP [address] [place],  [place] [address] PCP: Arnette Felts, FNP   Assessment & Plan: Visit Diagnoses:  1. Unilateral primary osteoarthritis, left knee     Plan: Impression is left knee osteoarthritis.  Today, proceed with left knee Supartz injection.  She tolerated this well.  Follow-up in one week for second injection  Follow-Up Instructions: Return if symptoms worsen or fail to improve.   Orders:  No orders of the defined types were placed in this encounter.  No orders of the defined types were placed in this encounter.     Procedures: Large Joint Inj: L knee on 08/08/2023 5:14 PM Details: 22 G needle Medications: 2 mL bupivacaine 0.5 %; 2 mL lidocaine 1 %; 25 mg Sodium Hyaluronate (Viscosup) 25 MG/2.5ML Outcome: tolerated well, no immediate complications Patient was prepped and draped in the usual sterile fashion.       Clinical Data: No additional findings.   Subjective: Chief Complaint  Patient presents with   Left Knee - Follow-up    Supartz    HPI patient is a pleasant 47 year old female who comes in today for left knee Supartz injection.  History of underlying arthritis.  Previous cortisone injections did not provide long-lasting relief.     Objective: Vital Signs: LMP 07/05/2023     Ortho Exam stable left knee exam  Specialty Comments:  No specialty comments available.  Imaging: No new imaging   PMFS History: Patient Active Problem List   Diagnosis Date Noted   Vitamin D deficiency 07/21/2023   Encounter for annual health examination 07/21/2023   Elevated LDL cholesterol level 07/21/2023   H/O gastric sleeve 07/21/2023   COVID-19 vaccine administered 07/21/2023   Chronic pain of left knee 04/30/2023   Class 2 obesity due to excess  calories with body mass index (BMI) of 38.0 to 38.9 in adult 04/30/2023   Need for influenza vaccination 04/30/2023   Other intervertebral disc degeneration, lumbar region 09/26/2021   OSA (obstructive sleep apnea) 06/15/2018   Chronic lumbosacral pain 06/15/2018   Essential hypertension 05/24/2018   Heart palpitations 01/06/2018   Snoring 01/06/2018   Morbid obesity (HCC) 01/06/2018   Past Medical History:  Diagnosis Date   Hypertension    Obesity     Family History  Problem Relation Age of Onset   Diabetes Mother    Hypertension Mother    Peripheral Artery Disease Mother    Hypertension Father    Prostate cancer Father    Hypertension Sister    Diabetes Maternal Aunt    Hypertension Maternal Grandmother    Diabetes Maternal Aunt    Lung cancer Maternal Aunt     Past Surgical History:  Procedure Laterality Date   ACHILLES TENDON REPAIR Left 2011   CESAREAN SECTION     LAPAROSCOPIC GASTRIC SLEEVE RESECTION N/A 06/15/2018   Procedure: LAPAROSCOPIC GASTRIC SLEEVE RESECTION, UPPER ENDO, ERAS PATHWAY;  Surgeon: Gaynelle Adu, MD;  Location: [facility];  Service: General;  Laterality: N/A;   TUBAL LIGATION     Social History   Occupational History   Not on file  Tobacco Use   Smoking status: Never   Smokeless tobacco: Never  Vaping Use   Vaping status: Never Used  Substance and Sexual Activity   Alcohol use: Yes    Comment: ocasionally   Drug use: Not Currently   Sexual activity: Yes

## 2023-08-08 DIAGNOSIS — M1712 Unilateral primary osteoarthritis, left knee: Secondary | ICD-10-CM

## 2023-08-08 MED ORDER — SODIUM HYALURONATE (VISCOSUP) 25 MG/2.5ML IX SOSY
25.0000 mg | PREFILLED_SYRINGE | INTRA_ARTICULAR | Status: AC | PRN
  Administered 2023-08-08: 25 mg via INTRA_ARTICULAR

## 2023-08-08 MED ORDER — LIDOCAINE HCL 1 % IJ SOLN
2.0000 mL | INTRAMUSCULAR | Status: AC | PRN
  Administered 2023-08-08: 2 mL

## 2023-08-08 MED ORDER — BUPIVACAINE HCL 0.5 % IJ SOLN
2.0000 mL | INTRAMUSCULAR | Status: AC | PRN
  Administered 2023-08-08: 2 mL via INTRA_ARTICULAR

## 2023-08-13 ENCOUNTER — Other Ambulatory Visit: Payer: Self-pay | Admitting: Nurse Practitioner

## 2023-08-13 DIAGNOSIS — M1712 Unilateral primary osteoarthritis, left knee: Secondary | ICD-10-CM

## 2023-08-13 MED ORDER — HYDROCORTISONE 2.5 % EX CREA: 1.0000 | TOPICAL_CREAM | CUTANEOUS | 1 refills | Status: AC

## 2023-08-13 MED ORDER — DAPSONE 7.5 % EX GEL: 1.0000 | Freq: Every day | CUTANEOUS | 1 refills | Status: AC | PRN

## 2023-08-13 MED ORDER — TRETINOIN 0.025 % EX CREA: 1.0000 | TOPICAL_CREAM | Freq: Every day | CUTANEOUS | 1 refills | Status: AC

## 2023-08-13 NOTE — Progress Notes (Unsigned)
   Procedure Note  Patient: Maria Bryan             Date of Birth: February 06, 1976           MRN: [id number]             Visit Date: 08/14/2023  Procedures: Visit Diagnoses:  1. Primary osteoarthritis of left knee    This was the patient's second Supartz injection.  Large Joint Inj: L knee on 08/13/2023 4:53 PM Indications: pain Details: 22 G needle  Arthrogram: No  Medications: 25 mg Sodium Hyaluronate (Viscosup) 25 MG/2.5ML Outcome: tolerated well, no immediate complications Patient was prepped and draped in the usual sterile fashion.

## 2023-08-14 ENCOUNTER — Encounter: Payer: Self-pay | Admitting: Orthopaedic Surgery

## 2023-08-14 ENCOUNTER — Ambulatory Visit: Payer: Federal, State, Local not specified - PPO | Admitting: Orthopaedic Surgery

## 2023-08-14 DIAGNOSIS — M1712 Unilateral primary osteoarthritis, left knee: Secondary | ICD-10-CM

## 2023-08-14 MED ORDER — SODIUM HYALURONATE (VISCOSUP) 25 MG/2.5ML IX SOSY
25.0000 mg | PREFILLED_SYRINGE | INTRA_ARTICULAR | Status: AC | PRN
  Administered 2023-08-13: 25 mg via INTRA_ARTICULAR

## 2023-08-22 ENCOUNTER — Ambulatory Visit: Payer: Federal, State, Local not specified - PPO | Admitting: Orthopaedic Surgery

## 2023-08-25 ENCOUNTER — Telehealth: Payer: Self-pay | Admitting: Orthopaedic Surgery

## 2023-08-25 ENCOUNTER — Ambulatory Visit: Payer: Federal, State, Local not specified - PPO | Admitting: Orthopaedic Surgery

## 2023-08-25 ENCOUNTER — Encounter: Payer: Self-pay | Admitting: Orthopaedic Surgery

## 2023-08-25 DIAGNOSIS — M1712 Unilateral primary osteoarthritis, left knee: Secondary | ICD-10-CM

## 2023-08-25 MED ORDER — SODIUM HYALURONATE (VISCOSUP) 25 MG/2.5ML IX SOSY
25.0000 mg | PREFILLED_SYRINGE | INTRA_ARTICULAR | Status: AC | PRN
  Administered 2023-08-25: 25 mg via INTRA_ARTICULAR

## 2023-08-25 NOTE — Telephone Encounter (Signed)
Patient called and said that she was going to be late and she needed to rescheduled. His next available will be too far she stated. Can you fit her in. [phone]

## 2023-08-25 NOTE — Progress Notes (Signed)
Office Visit Note   Patient: Maria Bryan           Date of Birth: [birth year]           MRN: [id number] Visit Date: 08/25/2023              Requested by: Arnette Felts, FNP [address] [place],  [place] [address] PCP: Arnette Felts, FNP   Assessment & Plan: Visit Diagnoses:  1. Unilateral primary osteoarthritis, left knee     Plan: Impression is left knee osteoarthritis.  Today, we proceed with Supartz over 3 injection to the left knee.  She tolerated this well.  Follow-up as needed.  Follow-Up Instructions: Return if symptoms worsen or fail to improve.   Orders:  No orders of the defined types were placed in this encounter.  No orders of the defined types were placed in this encounter.     Procedures: Large Joint Inj: L knee on 08/25/2023 12:48 PM Details: 22 G needle Medications: 25 mg Sodium Hyaluronate (Viscosup) 25 MG/2.5ML Outcome: tolerated well, no immediate complications Patient was prepped and draped in the usual sterile fashion.       Clinical Data: No additional findings.   Subjective: Chief Complaint  Patient presents with   Left Knee - Follow-up    Supartz #3    HPI patient is a pleasant 47 year old female with underlying left knee osteoarthritis who comes in today for Supartz No. 3 to the left knee.  She has noticed some improvement in symptoms since starting the series of injections a couple weeks ago.     Objective: Vital Signs: There were no vitals taken for this visit.    Ortho Exam unchanged left knee exam  Specialty Comments:  No specialty comments available.  Imaging: No new imaging   PMFS History: Patient Active Problem List   Diagnosis Date Noted   Vitamin D deficiency 07/21/2023   Encounter for annual health examination 07/21/2023   Elevated LDL cholesterol level 07/21/2023   H/O gastric sleeve 07/21/2023   COVID-19 vaccine administered 07/21/2023   Chronic pain of left knee 04/30/2023    Class 2 obesity due to excess calories with body mass index (BMI) of 38.0 to 38.9 in adult 04/30/2023   Need for influenza vaccination 04/30/2023   Other intervertebral disc degeneration, lumbar region 09/26/2021   OSA (obstructive sleep apnea) 06/15/2018   Chronic lumbosacral pain 06/15/2018   Essential hypertension 05/24/2018   Heart palpitations 01/06/2018   Snoring 01/06/2018   Morbid obesity (HCC) 01/06/2018   Past Medical History:  Diagnosis Date   Hypertension    Obesity     Family History  Problem Relation Age of Onset   Diabetes Mother    Hypertension Mother    Peripheral Artery Disease Mother    Hypertension Father    Prostate cancer Father    Hypertension Sister    Diabetes Maternal Aunt    Hypertension Maternal Grandmother    Diabetes Maternal Aunt    Lung cancer Maternal Aunt     Past Surgical History:  Procedure Laterality Date   ACHILLES TENDON REPAIR Left 2011   CESAREAN SECTION     LAPAROSCOPIC GASTRIC SLEEVE RESECTION N/A 06/15/2018   Procedure: LAPAROSCOPIC GASTRIC SLEEVE RESECTION, UPPER ENDO, ERAS PATHWAY;  Surgeon: Gaynelle Adu, MD;  Location: [facility];  Service: General;  Laterality: N/A;   TUBAL LIGATION     Social History   Occupational History   Not on file  Tobacco Use  Smoking status: Never   Smokeless tobacco: Never  Vaping Use   Vaping status: Never Used  Substance and Sexual Activity   Alcohol use: Yes    Comment: ocasionally   Drug use: Not Currently   Sexual activity: Yes

## 2023-09-01 DIAGNOSIS — Z01419 Encounter for gynecological examination (general) (routine) without abnormal findings: Secondary | ICD-10-CM | POA: Diagnosis not present

## 2023-11-03 ENCOUNTER — Other Ambulatory Visit: Payer: Self-pay | Admitting: Nurse Practitioner

## 2023-11-14 ENCOUNTER — Other Ambulatory Visit: Payer: Self-pay

## 2023-11-14 MED ORDER — AMLODIPINE BESYLATE 10 MG PO TABS: ORAL_TABLET | ORAL | 1 refills | Status: DC

## 2023-12-10 ENCOUNTER — Encounter: Payer: Self-pay | Admitting: Nurse Practitioner

## 2023-12-21 DIAGNOSIS — J020 Streptococcal pharyngitis: Secondary | ICD-10-CM | POA: Diagnosis not present

## 2023-12-21 DIAGNOSIS — R070 Pain in throat: Secondary | ICD-10-CM | POA: Diagnosis not present

## 2024-01-13 ENCOUNTER — Ambulatory Visit: Payer: Federal, State, Local not specified - PPO | Admitting: Nurse Practitioner

## 2024-01-26 ENCOUNTER — Ambulatory Visit: Admitting: Nurse Practitioner

## 2024-01-26 NOTE — Progress Notes (Deleted)
 [place], CMA,acting as a Neurosurgeon for [name], FNP.,have documented all relevant documentation on the behalf of [name], FNP,as directed by  [name], FNP while in the presence of [name], FNP.  Subjective:  Patient ID: Maria Bryan , female    DOB: 1976-05-20 , 48 y.o.   MRN: [id number]  No chief complaint on file.   HPI  HPI   Past Medical History:  Diagnosis Date   Hypertension    Obesity      Family History  Problem Relation Age of Onset   Diabetes Mother    Hypertension Mother    Peripheral Artery Disease Mother    Hypertension Father    Prostate cancer Father    Hypertension Sister    Diabetes Maternal Aunt    Hypertension Maternal Grandmother    Diabetes Maternal Aunt    Lung cancer Maternal Aunt      Current Outpatient Medications:    acetaminophen  (TYLENOL ) 500 MG tablet, Take 1,000 mg by mouth daily as needed for moderate pain or headache., Disp: , Rfl:    amLODipine  (NORVASC ) 10 MG tablet, TAKE 1 TABLET DAILY., Disp: 90 tablet, Rfl: 1   calcium carbonate (OS-CAL) 600 MG TABS tablet, Take by mouth., Disp: , Rfl:    Clindamycin-Benzoyl Per, Refr, gel, , Disp: , Rfl:    cyclobenzaprine (FLEXERIL) 10 MG tablet, Take 10 mg by mouth daily as needed for muscle spasms., Disp: , Rfl:    Dapsone  7.5 % GEL, Apply 1 application  topically daily as needed (acne)., Disp: 1 g, Rfl: 1   Fluocinolone Acetonide Scalp 0.01 % OIL, Apply 1 application topically every 14 (fourteen) days., Disp: , Rfl:    fluocinonide  ointment (LIDEX ) 0.05 %, Apply 1 Application topically every other day., Disp: 30 g, Rfl: 1   hydrocortisone  2.5 % cream, Apply 1 Application topically every other day., Disp: 30 g, Rfl: 1   labetalol  (NORMODYNE ) 200 MG tablet, TAKE 1 TABLET BY MOUTH TWICE A DAY, Disp: 180 tablet, Rfl: 0   Multiple Vitamins-Minerals (OPURITY PO), Take 1 capsule by mouth., Disp: , Rfl:    PRESCRIPTION MEDICATION, Apply 1 application topically daily as  needed (pain). diclofenac 3% baclofen 2% lidocaine  5% Menthol 1%, Disp: , Rfl:    traMADol  (ULTRAM ) 50 MG tablet, Take 1 tablet (50 mg total) by mouth every 12 (twelve) hours as needed., Disp: 30 tablet, Rfl: 1   tretinoin  (RETIN-A ) 0.025 % cream, Apply 1 application  topically at bedtime., Disp: 45 g, Rfl: 1   Allergies  Allergen Reactions   Nsaids      Review of Systems   There were no vitals filed for this visit. There is no height or weight on file to calculate BMI.  Wt Readings from Last 3 Encounters:  07/16/23 229 lb 12.8 oz (104.2 kg)  04/30/23 232 lb (105.2 kg)  04/30/22 229 lb 12.8 oz (104.2 kg)    The 10-year ASCVD risk score (Arnett DK, et al., 2019) is: 0.9%   Values used to calculate the score:     Age: 20 years     Sex: Female     Is Non-Hispanic African American: No     Diabetic: No     Tobacco smoker: No     Systolic Blood Pressure: 120 mmHg     Is BP treated: Yes     HDL Cholesterol: 90 mg/dL     Total Cholesterol: 243 mg/dL  Objective:  Physical Exam  Assessment And Plan:  Essential hypertension    No follow-ups on file.  Patient was given opportunity to ask questions. Patient verbalized understanding of the plan and was able to repeat key elements of the plan. All questions were answered to their satisfaction.    Inge Mangle, FNP, have reviewed all documentation for this visit. The documentation on 01/26/24 for the exam, diagnosis, procedures, and orders are all accurate and complete.   IF YOU HAVE BEEN REFERRED TO A SPECIALIST, IT MAY TAKE 1-2 WEEKS TO SCHEDULE/PROCESS THE REFERRAL. IF YOU HAVE NOT HEARD FROM US /SPECIALIST IN TWO WEEKS, PLEASE GIVE US  A CALL AT [phone].

## 2024-01-27 ENCOUNTER — Ambulatory Visit: Admitting: Nurse Practitioner

## 2024-02-09 ENCOUNTER — Other Ambulatory Visit: Payer: Self-pay | Admitting: Nurse Practitioner

## 2024-02-23 ENCOUNTER — Encounter: Payer: Self-pay | Admitting: Nurse Practitioner

## 2024-02-23 ENCOUNTER — Ambulatory Visit: Admitting: Nurse Practitioner

## 2024-02-23 VITALS — BP 120/80 | HR 88 | Temp 98.5°F | Ht 65.0 in | Wt 243.6 lb

## 2024-02-23 DIAGNOSIS — G4733 Obstructive sleep apnea (adult) (pediatric): Secondary | ICD-10-CM

## 2024-02-23 DIAGNOSIS — M79671 Pain in right foot: Secondary | ICD-10-CM

## 2024-02-23 DIAGNOSIS — E782 Mixed hyperlipidemia: Secondary | ICD-10-CM

## 2024-02-23 DIAGNOSIS — D508 Other iron deficiency anemias: Secondary | ICD-10-CM | POA: Diagnosis not present

## 2024-02-23 DIAGNOSIS — I10 Essential (primary) hypertension: Secondary | ICD-10-CM | POA: Diagnosis not present

## 2024-02-23 DIAGNOSIS — Z903 Acquired absence of stomach [part of]: Secondary | ICD-10-CM

## 2024-02-23 DIAGNOSIS — Z6841 Body Mass Index (BMI) 40.0 and over, adult: Secondary | ICD-10-CM

## 2024-02-23 DIAGNOSIS — G8929 Other chronic pain: Secondary | ICD-10-CM

## 2024-02-23 DIAGNOSIS — M5440 Lumbago with sciatica, unspecified side: Secondary | ICD-10-CM

## 2024-02-23 DIAGNOSIS — M25562 Pain in left knee: Secondary | ICD-10-CM

## 2024-02-23 MED ORDER — LABETALOL HCL 200 MG PO TABS: 200.0000 mg | ORAL_TABLET | Freq: Once | ORAL | 1 refills | Status: DC

## 2024-02-23 NOTE — Progress Notes (Signed)
 LILLETTE Kristeen JINNY Gladis, CMA,acting as a Neurosurgeon for Gaines Ada, FNP.,have documented all relevant documentation on the behalf of Gaines Ada, FNP,as directed by  Gaines Ada, FNP while in the presence of Gaines Ada, FNP.  Subjective:  Patient ID: Maria Bryan , female    DOB: 05/26/1976 , 48 y.o.   MRN: [id number]  Chief Complaint  Patient presents with   Hypertension    Patient presents today for a bp follow up, Patient reports compliance with medication. Patient denies any chest pain, SOB, or headaches. Patient has no concerns today.    Foot Pain    Patient reports for the past 2 months she has been having right foot pain on the bottom of her foot. She is still having bilateral knee pain and neck pain. She would like to see a holistic doctor for her pain. She also wants to do water  PT.    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 She is on as needed status. She went through gastric sleeve in 2019. When she is in water  therapy she does well with her knees. She is seeing an orthopedic, she did cortisone injections and gel injections.      Past Medical History:  Diagnosis Date   Hypertension    Obesity      Family History  Problem Relation Age of Onset   Diabetes Mother    Hypertension Mother    Peripheral Artery Disease Mother    Hypertension Father    Prostate cancer Father    Hypertension Sister    Diabetes Maternal Aunt    Hypertension Maternal Grandmother    Diabetes Maternal Aunt    Lung cancer Maternal Aunt      Current Outpatient Medications:    acetaminophen  (TYLENOL ) 500 MG tablet, Take 1,000 mg by mouth daily as needed for moderate pain or headache., Disp: , Rfl:    amLODipine  (NORVASC ) 10 MG tablet, TAKE 1 TABLET DAILY., Disp: 90 tablet, Rfl: 1   calcium carbonate (OS-CAL) 600 MG TABS tablet, Take by mouth., Disp: , Rfl:    Clindamycin-Benzoyl Per, Refr, gel, , Disp: , Rfl:    cyclobenzaprine (FLEXERIL) 10 MG  tablet, Take 10 mg by mouth daily as needed for muscle spasms., Disp: , Rfl:    Dapsone  7.5 % GEL, Apply 1 application  topically daily as needed (acne)., Disp: 1 g, Rfl: 1   Fluocinolone Acetonide Scalp 0.01 % OIL, Apply 1 application topically every 14 (fourteen) days., Disp: , Rfl:    fluocinonide  ointment (LIDEX ) 0.05 %, Apply 1 Application topically every other day., Disp: 30 g, Rfl: 1   hydrocortisone  2.5 % cream, Apply 1 Application topically every other day., Disp: 30 g, Rfl: 1   Multiple Vitamins-Minerals (OPURITY PO), Take 1 capsule by mouth., Disp: , Rfl:    PRESCRIPTION MEDICATION, Apply 1 application topically daily as needed (pain). diclofenac 3% baclofen 2% lidocaine  5% Menthol 1%, Disp: , Rfl:    traMADol  (ULTRAM ) 50 MG tablet, Take 1 tablet (50 mg total) by mouth every 12 (twelve) hours as needed., Disp: 30 tablet, Rfl: 1   tretinoin  (RETIN-A ) 0.025 % cream, Apply 1 application  topically at bedtime., Disp: 45 g, Rfl: 1   labetalol  (NORMODYNE ) 200 MG tablet, Take 1 tablet (200 mg total) by mouth once for 1 dose., Disp: 90 tablet, Rfl: 1   Allergies  Allergen Reactions   Nsaids      Review of Systems  Constitutional:  Negative.   HENT: Negative.    Eyes: Negative.   Respiratory: Negative.    Cardiovascular: Negative.   Gastrointestinal: Negative.   Psychiatric/Behavioral: Negative.       Today's Vitals   02/23/24 1531  BP: 120/80  Pulse: 88  Temp: 98.5 F (36.9 C)  TempSrc: Oral  Weight: 243 lb 9.6 oz (110.5 kg)  Height: 5' 5 (1.651 m)  PainSc: 0-No pain   Body mass index is 40.54 kg/m.  Wt Readings from Last 3 Encounters:  02/23/24 243 lb 9.6 oz (110.5 kg)  07/16/23 229 lb 12.8 oz (104.2 kg)  04/30/23 232 lb (105.2 kg)      Objective:  Physical Exam Vitals and nursing note reviewed.  Constitutional:      General: She is not in acute distress.    Appearance: Normal appearance. She is well-developed. She is obese.  HENT:     Head: Normocephalic and  atraumatic.  Eyes:     Pupils: Pupils are equal, round, and reactive to light.  Cardiovascular:     Rate and Rhythm: Normal rate and regular rhythm.     Pulses: Normal pulses.     Heart sounds: Normal heart sounds. No murmur heard. Pulmonary:     Effort: Pulmonary effort is normal. No respiratory distress.     Breath sounds: Normal breath sounds. No wheezing.  Skin:    General: Skin is warm and dry.     Capillary Refill: Capillary refill takes less than 2 seconds.  Neurological:     General: No focal deficit present.     Mental Status: She is alert and oriented to person, place, and time.     Cranial Nerves: No cranial nerve deficit.  Psychiatric:        Mood and Affect: Mood normal.        Behavior: Behavior normal.        Thought Content: Thought content normal.        Judgment: Judgment normal.      Assessment And Plan:  Essential hypertension Assessment & Plan: Blood pressure is well controlled, continue current medications.   Orders: -     BMP8+eGFR -     Referral to Nutrition and Diabetes Services -     Labetalol  HCl; Take 1 tablet (200 mg total) by mouth once for 1 dose.  Dispense: 90 tablet; Refill: 1 -     Microalbumin / creatinine urine ratio  Right foot pain Assessment & Plan: Possible plantar fascititis, she is advised to do exercises for her plantar are and can use a frozen water  bottle and roll foot on it.   OSA (obstructive sleep apnea) Assessment & Plan: She has not been using her CPAP.    Chronic pain of left knee Assessment & Plan: Intermittent knee pain which limits her from being able to exercise on a regular basis.    Chronic midline low back pain with sciatica, sciatica laterality unspecified  Mixed hyperlipidemia Assessment & Plan: Chronic, will check lipid panel.  Orders: -     Lipid panel  Iron deficiency anemia secondary to inadequate dietary iron intake Assessment & Plan: Will check iron levels. Encouraged to eat foods rich in  iron.   Orders: -     Iron, TIBC and Ferritin Panel  H/O gastric sleeve  Morbid obesity with BMI of 40.0-44.9, adult [facility]) Assessment & Plan: Long discussion about focusing on weight loss. Her knee pain affects her ability to exercise. Will refer to dietician to see if they  can help with a healthier diet. She is under more stress right now so that can affect her weight loss as well.      Return for keep same next.  Patient was given opportunity to ask questions. Patient verbalized understanding of the plan and was able to repeat key elements of the plan. All questions were answered to their satisfaction.    LILLETTE Gaines Ada, FNP, have reviewed all documentation for this visit. The documentation on 02/23/24 for the exam, diagnosis, procedures, and orders are all accurate and complete.   IF YOU HAVE BEEN REFERRED TO A SPECIALIST, IT MAY TAKE 1-2 WEEKS TO SCHEDULE/PROCESS THE REFERRAL. IF YOU HAVE NOT HEARD FROM US /SPECIALIST IN TWO WEEKS, PLEASE GIVE US  A CALL AT [phone].

## 2024-02-23 NOTE — Patient Instructions (Signed)
 Use a frozen water  bottle to massage your foot by rolling over.

## 2024-02-24 LAB — MICROALBUMIN / CREATININE URINE RATIO
Creatinine, Urine: 106.6 mg/dL
Microalb/Creat Ratio: 12 mg/g{creat} (ref 0–29)
Microalbumin, Urine: 12.5 ug/mL

## 2024-02-24 LAB — BMP8+EGFR
BUN/Creatinine Ratio: 17 (ref 9–23)
BUN: 15 mg/dL (ref 6–24)
CO2: 19 mmol/L — ABNORMAL LOW (ref 20–29)
Calcium: 9.3 mg/dL (ref 8.7–10.2)
Chloride: 103 mmol/L (ref 96–106)
Creatinine, Ser: 0.88 mg/dL (ref 0.57–1.00)
Glucose: 88 mg/dL (ref 70–99)
Potassium: 4.2 mmol/L (ref 3.5–5.2)
Sodium: 139 mmol/L (ref 134–144)
eGFR: 81 mL/min/{1.73_m2} (ref >59)

## 2024-02-24 LAB — LIPID PANEL
Chol/HDL Ratio: 3 ratio (ref 0.0–4.4)
Cholesterol, Total: 221 mg/dL — ABNORMAL HIGH (ref 100–199)
HDL: 73 mg/dL (ref >39)
LDL Chol Calc (NIH): 129 mg/dL — ABNORMAL HIGH (ref 0–99)
Triglycerides: 110 mg/dL (ref 0–149)
VLDL Cholesterol Cal: 19 mg/dL (ref 5–40)

## 2024-02-24 LAB — IRON,TIBC AND FERRITIN PANEL
Ferritin: 10 ng/mL — ABNORMAL LOW (ref 15–150)
Iron Saturation: 38 % (ref 15–55)
Iron: 195 ug/dL — ABNORMAL HIGH (ref 27–159)
Total Iron Binding Capacity: 512 ug/dL — ABNORMAL HIGH (ref 250–450)
UIBC: 317 ug/dL (ref 131–425)

## 2024-02-29 ENCOUNTER — Ambulatory Visit: Payer: Self-pay | Admitting: Nurse Practitioner

## 2024-02-29 DIAGNOSIS — E782 Mixed hyperlipidemia: Secondary | ICD-10-CM | POA: Insufficient documentation

## 2024-02-29 DIAGNOSIS — D508 Other iron deficiency anemias: Secondary | ICD-10-CM | POA: Insufficient documentation

## 2024-02-29 DIAGNOSIS — G8929 Other chronic pain: Secondary | ICD-10-CM | POA: Insufficient documentation

## 2024-02-29 NOTE — Assessment & Plan Note (Signed)
 Chronic, will check lipid panel

## 2024-02-29 NOTE — Assessment & Plan Note (Signed)
 Will check iron levels. Encouraged to eat foods rich in iron.

## 2024-02-29 NOTE — Assessment & Plan Note (Signed)
 Blood pressure is well controlled, continue current medications.

## 2024-02-29 NOTE — Assessment & Plan Note (Signed)
 Intermittent knee pain which limits her from being able to exercise on a regular basis.

## 2024-02-29 NOTE — Assessment & Plan Note (Addendum)
 Long discussion about focusing on weight loss. Her knee pain affects her ability to exercise. Will refer to dietician to see if they can help with a healthier diet. She is under more stress right now so that can affect her weight loss as well.

## 2024-02-29 NOTE — Assessment & Plan Note (Signed)
 Possible plantar fascititis, she is advised to do exercises for her plantar are and can use a frozen water  bottle and roll foot on it.

## 2024-02-29 NOTE — Assessment & Plan Note (Signed)
 She has not been using her CPAP.

## 2024-03-24 ENCOUNTER — Ambulatory Visit: Admitting: Dietician

## 2024-03-25 ENCOUNTER — Encounter: Payer: Self-pay | Admitting: Dietician

## 2024-03-25 ENCOUNTER — Encounter: Attending: Nurse Practitioner | Admitting: Dietician

## 2024-03-25 VITALS — Ht 65.0 in | Wt 238.5 lb

## 2024-03-25 DIAGNOSIS — I10 Essential (primary) hypertension: Secondary | ICD-10-CM | POA: Diagnosis not present

## 2024-03-25 DIAGNOSIS — E6609 Other obesity due to excess calories: Secondary | ICD-10-CM | POA: Diagnosis not present

## 2024-03-25 DIAGNOSIS — Z6838 Body mass index (BMI) 38.0-38.9, adult: Secondary | ICD-10-CM

## 2024-03-25 DIAGNOSIS — Z903 Acquired absence of stomach [part of]: Secondary | ICD-10-CM | POA: Diagnosis not present

## 2024-03-25 DIAGNOSIS — E66812 Obesity, class 2: Secondary | ICD-10-CM

## 2024-03-25 NOTE — Progress Notes (Signed)
 Medical Nutrition Therapy: Visit start time: 0830  end time: 0930  Assessment:   Referral Diagnosis: HTN Other medical history/ diagnoses: obesity, hx of sleeve gastrectomy 2019, HLD Psychosocial issues/ stress concerns: none  Medications, supplements: reconciled list in medical record   Current weight: 238.5lbs Height: 5'5 BMI: 36.69   Progress and evaluation:  Patient reports losing about 80lbs after bariatric surgery, but regained due to various life events/ obstacles Recent labs: 02/23/24 total cholesterol 221, HDL 73, LDL 129, triglycerides 110  Patient reports BP currently well controlled with medications Patient is experiencing chronic knee pain and sciatic pain; she hopes to resume weight loss to help improve pain and reduce other health risks.  Trying to drink more water  Husband supportive, does most of cooking 2 kids in home after school activities Food allergies: none known Special diet practices: none Patient seeks help with anti-inflammatory eating pattern   Dietary Intake:  Usual eating pattern includes 3 meals and 1-2 snacks per day. Dining out frequency: avoids fast foods Who plans meals/ buys groceries? self Who prepares meals? self  Breakfast: 1-2 eggs + sausage patty or 2 strips bacon; 1/2 breakfast sandwich out Snack: none Lunch: breakfast leftovers Snack: used to eat cheese and deli meat; keeping nuts out of the home son is allergic Supper: kids like pasta, breads -- trying to focus on protein; sometimes skips if no healthy options on hand; fruit and yogurt parfait Snack: none or same as pm Beverages: water , recently stopped sweet tea, no carbonated beverages  Physical activity: 10000 steps most days (but results in knee pain)   Intervention:   Nutrition Care Education:   Basic nutrition: appropriate nutrient balance; appropriate meal and snack schedule; general nutrition guidelines    Weight control: bariatric post-op maintenance pattern; importance  of low fat and low sugar foods and beverages Hypertension and anti-inflammatory:  DASH diet/ Mediterranean eating principles, goal for sodium intake; identifying food sources of potassium, magnesium; options for seasoning foods; healthy fats; inclusion of beans/ legumes, nuts/ seeds, herbs/ spices for anti-inflammatory benefits   Other intervention notes: Patient has been making diet and lifestyle changes to promote weight loss, has begun to notice some weight loss  She will work to more closely follow guidelines for anti-inflammatory eating pattern which will likely benefit blood pressure as well.  Follow up scheduled for 08/2024 per patient request.   Nutritional Diagnosis:  [place]-2.1 Inpaired nutrition utilization As related to hypertension.  As evidenced by BP controlled with medication. [place]-3.3 Overweight/obesity As related to history of overweight, history of excess calories and inadequate physical activity.  As evidenced by patient with current BMI of 36.7.   Education Materials given:  Bariatric Long-term maintenance guide Fighting Inflammation handout DASH diet implementation strategies Visit summary with goals/ instructions to be viewed in patient portal   Learner/ who was taught:  Patient   Level of understanding: Verbalizes/ demonstrates competency  Demonstrated degree of understanding via:   Teach back Learning barriers: None  Willingness to learn/ readiness for change: Eager, change in progress  Monitoring and Evaluation:  Dietary intake, exercise, and body weight      follow up: 09/01/24

## 2024-03-25 NOTE — Patient Instructions (Signed)
 Great job working on positive lifestyle changes! Plan meals that include protein + low carb veggies + whole grain or beans or high fiber starch most of the time. Make use of a variety of herbs and spices to season foods and minimize sodium Keep sodium intake to 2400mg  daily Follow DASH and/or Mediterranean eating guidelines; check www.oldwayspt.org for recipe ideas and other helpful info.

## 2024-03-31 ENCOUNTER — Encounter: Payer: Self-pay | Admitting: Nurse Practitioner

## 2024-04-02 ENCOUNTER — Ambulatory Visit: Admitting: Physician Assistant

## 2024-04-20 ENCOUNTER — Ambulatory Visit: Admitting: Physician Assistant

## 2024-04-30 ENCOUNTER — Other Ambulatory Visit (INDEPENDENT_AMBULATORY_CARE_PROVIDER_SITE_OTHER)

## 2024-04-30 ENCOUNTER — Ambulatory Visit: Admitting: Physician Assistant

## 2024-04-30 DIAGNOSIS — M170 Bilateral primary osteoarthritis of knee: Secondary | ICD-10-CM

## 2024-04-30 DIAGNOSIS — M722 Plantar fascial fibromatosis: Secondary | ICD-10-CM | POA: Diagnosis not present

## 2024-04-30 MED ORDER — METHYLPREDNISOLONE ACETATE 40 MG/ML IJ SUSP
13.3300 mg | INTRAMUSCULAR | Status: AC | PRN
Start: 2024-04-30 — End: 2024-04-30
  Administered 2024-04-30: 13.33 mg via INTRA_ARTICULAR

## 2024-04-30 MED ORDER — BUPIVACAINE HCL 0.25 % IJ SOLN
0.6600 mL | INTRAMUSCULAR | Status: AC | PRN
  Administered 2024-04-30: .66 mL via INTRA_ARTICULAR

## 2024-04-30 MED ORDER — LIDOCAINE HCL 1 % IJ SOLN
3.0000 mL | INTRAMUSCULAR | Status: AC | PRN
  Administered 2024-04-30: 3 mL

## 2024-04-30 MED ORDER — METHYLPREDNISOLONE ACETATE 40 MG/ML IJ SUSP
13.3300 mg | INTRAMUSCULAR | Status: AC | PRN
  Administered 2024-04-30: 13.33 mg via INTRA_ARTICULAR

## 2024-04-30 NOTE — Addendum Note (Signed)
 Addended by: Jamya Starry on: 04/30/2024 09:32 AM   Modules accepted: Orders

## 2024-04-30 NOTE — Progress Notes (Signed)
 Office Visit Note   Patient: Maria Bryan           Date of Birth: 08-01-1976           MRN: [id number] Visit Date: 04/30/2024              Requested by: Georgina Speaks, FNP [address] [place],  [place] [address] PCP: Georgina Speaks, FNP   Assessment & Plan: Visit Diagnoses:  1. Bilateral primary osteoarthritis of knee   2. Plantar fasciitis of right foot     Plan: Impression is bilateral knee osteoarthritis and right foot plantar fasciitis.  In regards to the knees, we discussed proceeding with cortisone injections today and going hide getting approval for viscosupplementation injections as well.  In regards to the plantar fasciitis, we have provided her with gastroc stretches.  Have also recommended appropriate shoe wear and orthotics.  She will follow-up with us  as needed.  Call with concerns or questions.  Follow-Up Instructions: Return if symptoms worsen or fail to improve.   Orders:  Orders Placed This Encounter  Procedures   Large Joint Inj: bilateral knee   XR KNEE 3 VIEW LEFT   XR KNEE 3 VIEW RIGHT   No orders of the defined types were placed in this encounter.     Procedures: Large Joint Inj: bilateral knee on 04/30/2024 9:21 AM Indications: pain Details: 22 G needle, anterolateral approach Medications (Right): 0.66 mL bupivacaine  0.25 %; 3 mL lidocaine  1 %; 13.33 mg methylPREDNISolone  acetate 40 MG/ML Medications (Left): 0.66 mL bupivacaine  0.25 %; 3 mL lidocaine  1 %; 13.33 mg methylPREDNISolone  acetate 40 MG/ML      Clinical Data: No additional findings.   Subjective: Chief Complaint  Patient presents with   Right Knee - Pain   Left Knee - Pain    HPI patient is a pleasant 48 year old female who comes in today with bilateral knee pain both equally as bad.  She initially had symptoms to the left knee last year but the right knee has become bothersome as well.  Pain is a constant ache worse with sleeping, activity as well as  going from a seated to standing position.  She has been wearing a knee brace and taking Tylenol  without significant relief.  She did undergo left knee Visco supplementation injection back in December which helped for a few months.  No previous cortisone injection or gel injection to the right knee.  She is also complaining of pain to the right heel.  Worse first thing in the morning after taking a few steps.  She has not been wearing any sort of orthotics but has been using her massage gun to the bottom of her heel.  Review of Systems as detailed in HPI.  All others reviewed and are negative.   Objective: Vital Signs: There were no vitals taken for this visit.  Physical Exam well-developed well-nourished female in no acute distress.  Alert and oriented x 3.  Ortho Exam bilateral knee exam: Trace effusion.  Range of motion 0 to 120 degrees.  Medial joint line tenderness.  Moderate patellofemoral crepitus.  She is neurovascular intact distally.  Specialty Comments:  No specialty comments available.  Imaging: XR KNEE 3 VIEW RIGHT Result Date: 04/30/2024 X-rays demonstrate moderate tricompartmental degenerative changes  XR KNEE 3 VIEW LEFT Result Date: 04/30/2024 X-rays demonstrate moderate tricompartment degenerative changes    PMFS History: Patient Active Problem List   Diagnosis Date Noted   Chronic midline low back pain  with sciatica 02/29/2024   Mixed hyperlipidemia 02/29/2024   Iron deficiency anemia secondary to inadequate dietary iron intake 02/29/2024   Right foot pain 02/23/2024   Vitamin D  deficiency 07/21/2023   Encounter for annual health examination 07/21/2023   H/O gastric sleeve 07/21/2023   COVID-19 vaccine administered 07/21/2023   Chronic pain of left knee 04/30/2023   Class 2 obesity due to excess calories with body mass index (BMI) of 38.0 to 38.9 in adult 04/30/2023   Need for influenza vaccination 04/30/2023   Other intervertebral disc degeneration, lumbar  region 09/26/2021   OSA (obstructive sleep apnea) 06/15/2018   Chronic lumbosacral pain 06/15/2018   Essential hypertension 05/24/2018   Snoring 01/06/2018   Morbid obesity with BMI of 40.0-44.9, adult (HCC) 01/06/2018   Past Medical History:  Diagnosis Date   Hypertension    Obesity     Family History  Problem Relation Age of Onset   Diabetes Mother    Hypertension Mother    Peripheral Artery Disease Mother    Hypertension Father    Prostate cancer Father    Hypertension Sister    Diabetes Maternal Aunt    Hypertension Maternal Grandmother    Diabetes Maternal Aunt    Lung cancer Maternal Aunt     Past Surgical History:  Procedure Laterality Date   ACHILLES TENDON REPAIR Left 2011   CESAREAN SECTION     LAPAROSCOPIC GASTRIC SLEEVE RESECTION N/A 06/15/2018   Procedure: LAPAROSCOPIC GASTRIC SLEEVE RESECTION, UPPER ENDO, ERAS PATHWAY;  Surgeon: Tanda Locus, MD;  Location: [facility];  Service: General;  Laterality: N/A;   TUBAL LIGATION     Social History   Occupational History   Not on file  Tobacco Use   Smoking status: Never   Smokeless tobacco: Never  Vaping Use   Vaping status: Never Used  Substance and Sexual Activity   Alcohol use: Yes    Comment: ocasionally, socially   Drug use: Never   Sexual activity: Yes

## 2024-05-05 ENCOUNTER — Ambulatory Visit: Admitting: Dietician

## 2024-05-21 ENCOUNTER — Ambulatory Visit: Admitting: Physician Assistant

## 2024-05-23 DIAGNOSIS — M5416 Radiculopathy, lumbar region: Secondary | ICD-10-CM | POA: Diagnosis not present

## 2024-05-28 ENCOUNTER — Ambulatory Visit: Admitting: Physician Assistant

## 2024-06-01 ENCOUNTER — Ambulatory Visit: Admitting: Physician Assistant

## 2024-06-01 ENCOUNTER — Ambulatory Visit

## 2024-06-04 ENCOUNTER — Ambulatory Visit: Admitting: Physician Assistant

## 2024-06-10 ENCOUNTER — Ambulatory Visit: Admitting: Physician Assistant

## 2024-06-10 DIAGNOSIS — M79604 Pain in right leg: Secondary | ICD-10-CM | POA: Diagnosis not present

## 2024-06-10 DIAGNOSIS — M170 Bilateral primary osteoarthritis of knee: Secondary | ICD-10-CM | POA: Diagnosis not present

## 2024-06-10 DIAGNOSIS — M25561 Pain in right knee: Secondary | ICD-10-CM

## 2024-06-10 DIAGNOSIS — G8929 Other chronic pain: Secondary | ICD-10-CM | POA: Diagnosis not present

## 2024-06-10 MED ORDER — BUPIVACAINE HCL 0.25 % IJ SOLN
0.6600 mL | INTRAMUSCULAR | Status: AC | PRN
  Administered 2024-06-10: .66 mL via INTRA_ARTICULAR

## 2024-06-10 MED ORDER — LIDOCAINE HCL 1 % IJ SOLN
3.0000 mL | INTRAMUSCULAR | Status: AC | PRN
  Administered 2024-06-10: 3 mL

## 2024-06-10 MED ORDER — SODIUM HYALURONATE (VISCOSUP) 25 MG/2.5ML IX SOSY
25.0000 mg | PREFILLED_SYRINGE | INTRA_ARTICULAR | Status: AC | PRN
  Administered 2024-06-10: 25 mg via INTRA_ARTICULAR

## 2024-06-10 NOTE — Progress Notes (Signed)
 Office Visit Note   Patient: Maria Bryan           Date of Birth: June 21, 1976           MRN: [id number] Visit Date: 06/10/2024              Requested by: Georgina Speaks, FNP [address] [place],  [place] [address] PCP: Georgina Speaks, FNP   Assessment & Plan: Visit Diagnoses:  1. Bilateral primary osteoarthritis of knee   2. Pain in right leg     Plan: Impression is bilateral knee osteoarthritis and new onset right posterior leg and calf pain.  I believe her symptoms are likely multifactorial.  She does have underlying right knee osteoarthritis likely contributing to the knee effusion.  We have discussed aspirating this today and sending off for cell count, culture and crystals.  As far as the posterior leg pain goes, this is likely referred from her lumbar spine, however because of the swelling in the lower leg I would like to order an ultrasound to rule out DVT.  In the meantime, we have proceeded with bilateral knee Supartz injections.  She will follow-up with us  next week for bilateral knee Supartz No. 2 injections.  Follow-Up Instructions: Return in about 1 week (around 06/17/2024).   Orders:  Orders Placed This Encounter  Procedures   Large Joint Inj   VAS US  LOWER EXTREMITY VENOUS (DVT)   No orders of the defined types were placed in this encounter.     Procedures: Large Joint Inj: bilateral knee on 06/10/2024 10:16 AM Indications: pain Details: 22 G needle, anterolateral approach Medications (Right): 0.66 mL bupivacaine  0.25 %; 3 mL lidocaine  1 %; 25 mg Sodium Hyaluronate (Viscosup) 25 MG/2.5ML Medications (Left): 0.66 mL bupivacaine  0.25 %; 3 mL lidocaine  1 %; 25 mg Sodium Hyaluronate (Viscosup) 25 MG/2.5ML      Clinical Data: No additional findings.   Subjective: Chief Complaint  Patient presents with   Right Knee - Pain   Left Knee - Pain    HPI patient is a pleasant 48 year old female who comes in today for bilateral knee  Supartz injections.  History of underlying osteoarthritis to both knees.  The other issue she brings up today is right leg pain and swelling which has been ongoing for few weeks and worsened over the past few days.  She denies any injury.  The pain goes from the posterior medial side down the posterior knee and into the back of the calf.  Pain is constant.  She has also had trouble getting on her shoe due to the swelling in the right leg/foot.  She was seen at [facility] urgent care where she was diagnosed with sciatica.  She does have a history of chronic low back pain.  She is concerned about possibility of a blood clot.  No previous DVT.  No chest pain or shortness of breath.  No fevers or chills or other constitutional symptoms.  Review of Systems as detailed in HPI.  All others reviewed and are negative.   Objective: Vital Signs: There were no vitals taken for this visit.  Physical Exam well-developed well-nourished female in no acute distress.  Alert and oriented x 3.  Ortho Exam right lower extremity exam: She has moderate tenderness in the popliteal fossa and into the calf.  She has mild swelling the right lower extremity.  Calf is soft.  No skin changes.  Pain with Homans.  Right knee: She does have a  moderate effusion.  Range of motion 0 to 95 degrees.  No joint line tenderness.  No warmth or skin changes.  She is neurovascularly intact distally.  Specialty Comments:  No specialty comments available.  Imaging: No new imaging   PMFS History: Patient Active Problem List   Diagnosis Date Noted   Chronic midline low back pain with sciatica 02/29/2024   Mixed hyperlipidemia 02/29/2024   Iron deficiency anemia secondary to inadequate dietary iron intake 02/29/2024   Right foot pain 02/23/2024   Vitamin D  deficiency 07/21/2023   Encounter for annual health examination 07/21/2023   H/O gastric sleeve 07/21/2023   COVID-19 vaccine administered 07/21/2023   Chronic pain of left knee  04/30/2023   Class 2 obesity due to excess calories with body mass index (BMI) of 38.0 to 38.9 in adult 04/30/2023   Need for influenza vaccination 04/30/2023   Other intervertebral disc degeneration, lumbar region 09/26/2021   OSA (obstructive sleep apnea) 06/15/2018   Chronic lumbosacral pain 06/15/2018   Essential hypertension 05/24/2018   Snoring 01/06/2018   Morbid obesity with BMI of 40.0-44.9, adult (HCC) 01/06/2018   Past Medical History:  Diagnosis Date   Hypertension    Obesity     Family History  Problem Relation Age of Onset   Diabetes Mother    Hypertension Mother    Peripheral Artery Disease Mother    Hypertension Father    Prostate cancer Father    Hypertension Sister    Diabetes Maternal Aunt    Hypertension Maternal Grandmother    Diabetes Maternal Aunt    Lung cancer Maternal Aunt     Past Surgical History:  Procedure Laterality Date   ACHILLES TENDON REPAIR Left 2011   CESAREAN SECTION     LAPAROSCOPIC GASTRIC SLEEVE RESECTION N/A 06/15/2018   Procedure: LAPAROSCOPIC GASTRIC SLEEVE RESECTION, UPPER ENDO, ERAS PATHWAY;  Surgeon: Tanda Locus, MD;  Location: [facility];  Service: General;  Laterality: N/A;   TUBAL LIGATION     Social History   Occupational History   Not on file  Tobacco Use   Smoking status: Never   Smokeless tobacco: Never  Vaping Use   Vaping status: Never Used  Substance and Sexual Activity   Alcohol use: Yes    Comment: ocasionally, socially   Drug use: Never   Sexual activity: Yes

## 2024-06-10 NOTE — Addendum Note (Signed)
 Addended by: Yides Saidi on: 06/10/2024 11:07 AM   Modules accepted: Orders

## 2024-06-11 ENCOUNTER — Telehealth: Payer: Self-pay | Admitting: Radiology

## 2024-06-11 ENCOUNTER — Other Ambulatory Visit: Payer: Self-pay | Admitting: Physician Assistant

## 2024-06-11 ENCOUNTER — Ambulatory Visit: Admitting: Physician Assistant

## 2024-06-11 ENCOUNTER — Ambulatory Visit (HOSPITAL_COMMUNITY)
Admission: RE | Admit: 2024-06-11 | Discharge: 2024-06-11 | Disposition: A | Discharge status: Home / Self Care | Source: Ambulatory Visit | Attending: Orthopaedic Surgery | Admitting: Orthopaedic Surgery

## 2024-06-11 ENCOUNTER — Ambulatory Visit

## 2024-06-11 DIAGNOSIS — M170 Bilateral primary osteoarthritis of knee: Secondary | ICD-10-CM | POA: Diagnosis not present

## 2024-06-11 DIAGNOSIS — M5416 Radiculopathy, lumbar region: Secondary | ICD-10-CM

## 2024-06-11 MED ORDER — METHOCARBAMOL 750 MG PO TABS: 750.0000 mg | ORAL_TABLET | Freq: Three times a day (TID) | ORAL | 2 refills | Status: AC | PRN

## 2024-06-11 MED ORDER — PREDNISONE 10 MG (21) PO TBPK: ORAL_TABLET | ORAL | 0 refills | Status: DC

## 2024-06-11 NOTE — Telephone Encounter (Signed)
 Patient called triage, states that she just left ultrasound for her right knee, she states that she would like to know the next steps. She does not want to go thru the weekend without a plan of some kind. Please call her back at [phone]

## 2024-06-11 NOTE — Telephone Encounter (Signed)
 Spoke to patient

## 2024-06-16 LAB — SYNOVIAL FLUID ANALYSIS, COMPLETE
Basophils, %: 0 %
Eosinophils-Synovial: 0 % (ref 0–2)
Lymphocytes-Synovial Fld: 1 % (ref 0–74)
Monocyte/Macrophage: 11 % (ref 0–69)
Neutrophil, Synovial: 88 % — ABNORMAL HIGH (ref 0–24)
Synoviocytes, %: 0 % (ref 0–15)
WBC, Synovial: 1309 {cells}/uL — ABNORMAL HIGH (ref <150)

## 2024-06-16 LAB — ANAEROBIC AND AEROBIC CULTURE
AER RESULT:: NO GROWTH
MICRO NUMBER:: 17111732
MICRO NUMBER:: 17111733
SPECIMEN QUALITY:: ADEQUATE
SPECIMEN QUALITY:: ADEQUATE

## 2024-06-17 ENCOUNTER — Ambulatory Visit: Admitting: Physician Assistant

## 2024-06-22 ENCOUNTER — Telehealth: Payer: Self-pay | Admitting: Physician Assistant

## 2024-06-22 NOTE — Telephone Encounter (Signed)
 Patient called and said her labs from the knee drawing and she said the results wasn't normal and she is now worried. CB#[phone]

## 2024-06-22 NOTE — Telephone Encounter (Signed)
 Tried calling patient and it went to vm.  If she calls back, please tell her the fluid just showed inflammation consistent with arthritis.  No signs of gout or infection.

## 2024-06-22 NOTE — Telephone Encounter (Signed)
 See message below

## 2024-06-24 ENCOUNTER — Ambulatory Visit: Admitting: Physician Assistant

## 2024-06-24 ENCOUNTER — Encounter: Payer: Self-pay | Admitting: Physician Assistant

## 2024-06-24 DIAGNOSIS — M170 Bilateral primary osteoarthritis of knee: Secondary | ICD-10-CM | POA: Diagnosis not present

## 2024-06-24 MED ORDER — LIDOCAINE HCL 1 % IJ SOLN
3.0000 mL | INTRAMUSCULAR | Status: AC | PRN
  Administered 2024-06-24: 3 mL

## 2024-06-24 MED ORDER — SODIUM HYALURONATE (VISCOSUP) 25 MG/2.5ML IX SOSY
25.0000 mg | PREFILLED_SYRINGE | INTRA_ARTICULAR | Status: AC | PRN
  Administered 2024-06-24: 25 mg via INTRA_ARTICULAR

## 2024-06-24 MED ORDER — BUPIVACAINE HCL 0.25 % IJ SOLN
0.6600 mL | INTRAMUSCULAR | Status: AC | PRN
  Administered 2024-06-24: .66 mL via INTRA_ARTICULAR

## 2024-06-24 NOTE — Progress Notes (Signed)
   Procedure Note  Patient: Maria Bryan             Date of Birth: Apr 21, 1976           MRN: [id number]             Visit Date: 06/24/2024  Procedures: Visit Diagnoses:  1. Bilateral primary osteoarthritis of knee     Large Joint Inj: bilateral knee on 06/24/2024 9:10 AM Indications: pain Details: 22 G needle, anterolateral approach Medications (Right): 0.66 mL bupivacaine  0.25 %; 3 mL lidocaine  1 %; 25 mg Sodium Hyaluronate (Viscosup) 25 MG/2.5ML Medications (Left): 0.66 mL bupivacaine  0.25 %; 3 mL lidocaine  1 %; 25 mg Sodium Hyaluronate (Viscosup) 25 MG/2.5ML

## 2024-06-28 ENCOUNTER — Encounter: Payer: Self-pay | Admitting: Radiology

## 2024-06-28 ENCOUNTER — Telehealth: Payer: Self-pay | Admitting: Physician Assistant

## 2024-06-28 NOTE — Telephone Encounter (Signed)
 Patient called. She would like a RX for orthopedic shoes, also a letter for work for [employer] duty. Her cb# [phone]

## 2024-06-29 NOTE — Telephone Encounter (Signed)
 Ok for light duty x 1 week.  I have never written a rx for orthopedic shoes?   Not sure what exactly needs to be written.  Is this for insurance to cover?

## 2024-06-29 NOTE — Telephone Encounter (Signed)
 Work note made. Sent to her Mychart.

## 2024-07-01 ENCOUNTER — Ambulatory Visit (INDEPENDENT_AMBULATORY_CARE_PROVIDER_SITE_OTHER): Admitting: Physician Assistant

## 2024-07-01 DIAGNOSIS — M170 Bilateral primary osteoarthritis of knee: Secondary | ICD-10-CM

## 2024-07-01 MED ORDER — LIDOCAINE HCL 1 % IJ SOLN
3.0000 mL | INTRAMUSCULAR | Status: AC | PRN
  Administered 2024-07-01: 3 mL

## 2024-07-01 MED ORDER — BUPIVACAINE HCL 0.25 % IJ SOLN
0.6600 mL | INTRAMUSCULAR | Status: AC | PRN
  Administered 2024-07-01: .66 mL via INTRA_ARTICULAR

## 2024-07-01 MED ORDER — SODIUM HYALURONATE (VISCOSUP) 25 MG/2.5ML IX SOSY
25.0000 mg | PREFILLED_SYRINGE | INTRA_ARTICULAR | Status: AC | PRN
  Administered 2024-07-01: 25 mg via INTRA_ARTICULAR

## 2024-07-01 NOTE — Progress Notes (Signed)
   Procedure Note  Patient: Maria Bryan             Date of Birth: 23-Feb-1976           MRN: [id number]             Visit Date: 07/01/2024  Procedures: Visit Diagnoses:  1. Bilateral primary osteoarthritis of knee     Large Joint Inj: bilateral knee on 07/01/2024 9:57 AM Indications: pain Details: 22 G needle, anterolateral approach Medications (Right): 0.66 mL bupivacaine  0.25 %; 3 mL lidocaine  1 %; 25 mg Sodium Hyaluronate (Viscosup) 25 MG/2.5ML Medications (Left): 0.66 mL bupivacaine  0.25 %; 3 mL lidocaine  1 %; 25 mg Sodium Hyaluronate (Viscosup) 25 MG/2.5ML

## 2024-07-16 ENCOUNTER — Other Ambulatory Visit: Payer: Self-pay | Admitting: Nurse Practitioner

## 2024-07-19 ENCOUNTER — Encounter: Payer: Self-pay | Admitting: Nurse Practitioner

## 2024-07-28 ENCOUNTER — Ambulatory Visit: Admitting: Podiatry

## 2024-08-06 ENCOUNTER — Ambulatory Visit (HOSPITAL_BASED_OUTPATIENT_CLINIC_OR_DEPARTMENT_OTHER): Attending: Physician Assistant | Admitting: Physical Therapy

## 2024-08-06 ENCOUNTER — Other Ambulatory Visit: Payer: Self-pay

## 2024-08-06 ENCOUNTER — Encounter (HOSPITAL_BASED_OUTPATIENT_CLINIC_OR_DEPARTMENT_OTHER): Payer: Self-pay | Admitting: Physical Therapy

## 2024-08-06 DIAGNOSIS — M5416 Radiculopathy, lumbar region: Secondary | ICD-10-CM | POA: Insufficient documentation

## 2024-08-06 DIAGNOSIS — M25561 Pain in right knee: Secondary | ICD-10-CM | POA: Insufficient documentation

## 2024-08-06 DIAGNOSIS — M5459 Other low back pain: Secondary | ICD-10-CM | POA: Insufficient documentation

## 2024-08-06 DIAGNOSIS — R293 Abnormal posture: Secondary | ICD-10-CM

## 2024-08-06 DIAGNOSIS — G8929 Other chronic pain: Secondary | ICD-10-CM | POA: Insufficient documentation

## 2024-08-06 DIAGNOSIS — M6281 Muscle weakness (generalized): Secondary | ICD-10-CM | POA: Diagnosis present

## 2024-08-06 NOTE — Progress Notes (Deleted)
° °  Established Patient Office Visit  Subjective   Patient ID: Maria Bryan, female    DOB: [birth year]  Age: 48 y.o. MRN: [id number]  No chief complaint on file.   HPI  {History (Optional):23778}  ROS    Objective:     There were no vitals taken for this visit. {Vitals History (Optional):23777}  Physical Exam   No results found for any visits on 08/06/24.  {Labs (Optional):23779}  The 10-year ASCVD risk score (Arnett DK, et al., 2019) is: 1.2%    Assessment & Plan:   Problem List Items Addressed This Visit   None   No follow-ups on file.    Frankie Christiano Blandon, PT

## 2024-08-06 NOTE — Therapy (Unsigned)
 OUTPATIENT PHYSICAL THERAPY THORACOLUMBAR EVALUATION   Patient Name: Maria Bryan MRN: [id number] DOB:[birth year], 48 y.o., female Today's Date: 08/06/2024  END OF SESSION:  PT End of Session - 08/06/24 1046     Visit Number 1    Date for Recertification  10/01/24    Authorization Type [employer] [place]    PT Start Time 0940    PT Stop Time 1015    PT Time Calculation (min) 35 min    Activity Tolerance Patient tolerated treatment well    Behavior During Therapy [facility] for tasks assessed/performed          Past Medical History:  Diagnosis Date   Hypertension    Obesity    Past Surgical History:  Procedure Laterality Date   ACHILLES TENDON REPAIR Left 2011   CESAREAN SECTION     LAPAROSCOPIC GASTRIC SLEEVE RESECTION N/A 06/15/2018   Procedure: LAPAROSCOPIC GASTRIC SLEEVE RESECTION, UPPER ENDO, ERAS PATHWAY;  Surgeon: Tanda Locus, MD;  Location: [facility];  Service: General;  Laterality: N/A;   TUBAL LIGATION     Patient Active Problem List   Diagnosis Date Noted   Chronic midline low back pain with sciatica 02/29/2024   Mixed hyperlipidemia 02/29/2024   Iron deficiency anemia secondary to inadequate dietary iron intake 02/29/2024   Right foot pain 02/23/2024   Vitamin D  deficiency 07/21/2023   Encounter for annual health examination 07/21/2023   H/O gastric sleeve 07/21/2023   COVID-19 vaccine administered 07/21/2023   Chronic pain of left knee 04/30/2023   Class 2 obesity due to excess calories with body mass index (BMI) of 38.0 to 38.9 in adult 04/30/2023   Need for influenza vaccination 04/30/2023   Other intervertebral disc degeneration, lumbar region 09/26/2021   OSA (obstructive sleep apnea) 06/15/2018   Chronic lumbosacral pain 06/15/2018   Essential hypertension 05/24/2018   Snoring 01/06/2018   Morbid obesity with BMI of 40.0-44.9, adult (HCC) 01/06/2018    PCP: Gaines Ada FNP  REFERRING PROVIDER: Ronal LITTIE Grave Pa-C  REFERRING DIAG: M54.16  (ICD-10-CM) - Radiculopathy, lumbar region   Rationale for Evaluation and Treatment: Rehabilitation  THERAPY DIAG:  Other low back pain  Chronic pain of right knee  Muscle weakness (generalized)  Abnormal posture  ONSET DATE: chronic  SUBJECTIVE:                                                                                                                                                                                           SUBJECTIVE STATEMENT:  I have not been keeping up with the exercises. When it comes back it comes back with a vengeance. Have all  sorts issues, knees OA, right foot plantar fascitis. I seem to hurt up in my shoulders. It seems to be all on right side. Have had steriod and gel injections in both knees.  Not much relief.  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 Yes: NPRS scale: current 3/10; worst 8/10; least 1-2/10 Pain location: LB into both hips/ knees Pain description: ache burn into right mid thigh Aggravating factors: standing causing burn x 10 minutes, first thing in am Relieving factors: sitting  PRECAUTIONS: None  RED FLAGS: None   WEIGHT BEARING RESTRICTIONS: No  FALLS:  Has patient fallen in last 6 months? No  LIVING ENVIRONMENT: Lives with: lives with their family Lives in: House/apartment Stairs: Yes: Internal: 16 steps; on right going up Has following equipment at home: None   OCCUPATION: attorney, sitting   PLOF: Independent   PATIENT GOALS increase strength, decrease pain, get back to exercise program  PATIENT GOALS: get back strengthening in all areas, reduce pain, get back to exercises  NEXT MD VISIT: as needed  OBJECTIVE:  Note: Objective measures were completed at Evaluation unless otherwise noted.  DIAGNOSTIC FINDINGS:  Previous MRI scan 09[place]18[place] includes significant lower lumbar degenerative changes with disc  desiccation 5 mm AP diameter canal at L4-5 severe right moderate left lateral recess narrowing.  Moderate foraminal encroachment.  L5-S1 disc bulge right greater than left extraforaminal disc extension.  Central canal without stenosis.  Moderate left and moderate to severe right foraminal encroachment.   PATIENT SURVEYS:  ODI: 28/50=56%    MUSCLE LENGTH: Hamstrings: wfl   POSTURE: increased lumbar lordosis, anterior pelvic tilt, right pelvic obliquity, and weight shift left  PALPATION: Mod TTP throughout lumbar paraspinals R>L and right sciatic notch glut area  LUMBAR ROM:   AROM eval  Flexion Full P!  Extension full  Right lateral flexion Full P!  Left lateral flexion Full P!  Right rotation   Left rotation    (Blank rows = not tested)  LOWER EXTREMITY ROM:    WFL  LOWER EXTREMITY MMT:    MMT Right eval Left eval  Hip flexion 45.5 56.3  Hip extension    Hip abduction 31.8 35.0  Hip adduction    Hip internal rotation    Hip external rotation    Knee flexion    Knee extension 32.0 41.1  Ankle dorsiflexion    Ankle plantarflexion    Ankle inversion    Ankle eversion     (Blank rows = not tested)  LUMBAR SPECIAL TESTS:  Slump test: Negative  FUNCTIONAL TESTS:  TUG: 14.38 4 stage balance Passed x4  GAIT: Distance walked: 500 ft Assistive device utilized: None Level of assistance: Complete Independence Comments: offloading left  TREATMENT  Eval Self care:Posture and optometrist instruction  PATIENT EDUCATION:  Education details: Discussed eval findings, rehab rationale, aquatic program progression/POC and pools in area. Patient is in agreement  Person educated: Patient Education method: Explanation Education comprehension: verbalized understanding  HOME EXERCISE PROGRAM: TBA  ASSESSMENT:  CLINICAL  IMPRESSION: Patient is a 48 y.o. f who was seen today for physical therapy evaluation and treatment for LBP and right knee pain. Pt is well known to this clinic having completed an episode for similar dx ~ 2 yrs ago.  Reports had been doing well until other issues surfaced with bilat knee pain, plantar fasciitis and then right sided sciatic nerve flare. Stopped going to [employer].  She  presents with pain limited deficits in right sided lumbosacral area into right hip with occasional burning into right post thigh.  Pain limits endurance, activity tolerance, gait, strength and functional mobility with ADL's. She reports lack of tolerance to exercise at gym.  She will benefit from skilled PT intervention in both aquatic and land settings. Plan to initiate in aquatic to reduce pain and guarding, improving movement patterns and ROM then will progress to land for optimal strengthening. Note: pt instructed on icing and stretching for plantar fasciitis and approp footware   OBJECTIVE IMPAIRMENTS: decreased activity tolerance, decreased endurance, decreased mobility, difficulty walking, decreased strength, and pain.   ACTIVITY LIMITATIONS: carrying, lifting, bending, sitting, standing, squatting, sleeping, stairs, transfers, and locomotion level  PARTICIPATION LIMITATIONS: cleaning, laundry, shopping, community activity, and yard work  PERSONAL FACTORS: 1-2 comorbidities: see PmHx are also affecting patient's functional outcome.   REHAB POTENTIAL: Good  CLINICAL DECISION MAKING: Evolving/moderate complexity  EVALUATION COMPLEXITY: Low   GOALS: Goals reviewed with patient? Yes  SHORT TERM GOALS: Target date: 09/06/24  Pt will tolerate full aquatic sessions consistently without increase in pain and with improving function to demonstrate good toleration and effectiveness of intervention.  Baseline: Goal status: INITIAL  2.  Pt will report return to [facility] to begin indep routine exerises Baseline:  stopped Goal status: INITIAL  3.  Pt will report reduced waking at night due to pain Baseline:  Goal status: INITIAL  4.  Pt will report ability to tolerate standing x >30 mins Baseline:  Goal status: INITIAL    LONG TERM GOALS: Target date: 10/01/24  Pt to improve on ODI by 13% to demonstrate statistically significant Improvement in function. (MCID 13-15%) Baseline: 28/50=56% Goal status: INITIAL  2.  Pt will improve strength in all areas listed by at least 5 lbs to demonstrate improved overall physical function Baseline:  Goal status: INITIAL  3.  Pt will report decrease in pain by at least 50% for improved toleration to activity/quality of life and to demonstrate improved management of pain. Baseline:  Goal status: INITIAL  4.  Pt will report return to [facility] and routine exercise regimen Baseline:  Goal status: INITIAL  5.  Pt will be indep with final HEP's (land and aquatic as appropriate) for continued management of condition Baseline:  Goal status: INITIAL    PLAN:  PT FREQUENCY: 2x/week  PT DURATION: 8 weeks  PLANNED INTERVENTIONS: 97164- PT Re-evaluation, 97750- Physical Performance Testing, 97110-Therapeutic exercises, 97530- Therapeutic activity, 97112- Neuromuscular re-education, 97535- Self Care, 02859- Manual therapy, [id number]- Gait training, [phone]- Aquatic Therapy, [phone]- Electrical stimulation (unattended), [phone]- Electrical stimulation (manual), 20560 (1-2 muscles), 20561 (3+ muscles)- Dry Needling, Patient/Family education, Balance training, Stair training, Taping, Joint mobilization, DME instructions, Cryotherapy, and Moist heat.  PLAN FOR NEXT SESSION: aquatic initially core and LE strengthening; stretching and positioning for pain  relief/management,   Ronal Foots) Johnica Armwood MPT 08/06/2024 1:02 PM [facility] [address] [place], [place], [address] Phone: [phone]   Fax:  [phone]

## 2024-08-09 LAB — HM MAMMOGRAPHY

## 2024-08-11 ENCOUNTER — Encounter: Payer: Self-pay | Admitting: Orthopaedic Surgery

## 2024-08-11 ENCOUNTER — Ambulatory Visit (HOSPITAL_BASED_OUTPATIENT_CLINIC_OR_DEPARTMENT_OTHER): Admitting: Physical Therapy

## 2024-08-11 NOTE — Telephone Encounter (Signed)
 See message.

## 2024-08-13 ENCOUNTER — Ambulatory Visit (HOSPITAL_BASED_OUTPATIENT_CLINIC_OR_DEPARTMENT_OTHER): Admitting: Physical Therapy

## 2024-08-18 ENCOUNTER — Encounter (HOSPITAL_BASED_OUTPATIENT_CLINIC_OR_DEPARTMENT_OTHER): Payer: Self-pay | Admitting: Physical Therapy

## 2024-08-18 ENCOUNTER — Ambulatory Visit (HOSPITAL_BASED_OUTPATIENT_CLINIC_OR_DEPARTMENT_OTHER): Admitting: Physical Therapy

## 2024-08-18 DIAGNOSIS — G8929 Other chronic pain: Secondary | ICD-10-CM

## 2024-08-18 DIAGNOSIS — M6281 Muscle weakness (generalized): Secondary | ICD-10-CM

## 2024-08-18 DIAGNOSIS — M5459 Other low back pain: Secondary | ICD-10-CM

## 2024-08-18 NOTE — Therapy (Signed)
 " OUTPATIENT PHYSICAL THERAPY THORACOLUMBAR EVALUATION   Patient Name: Maria Bryan MRN: [id number] DOB:1976/04/05, 48 y.o., female Today's Date: 08/18/2024  END OF SESSION:  PT End of Session - 08/18/24 0805     Visit Number 2    Date for Recertification  10/01/24    Authorization Type [employer] [place]    PT Start Time 0802    PT Stop Time 0840    PT Time Calculation (min) 38 min    Activity Tolerance Patient tolerated treatment well    Behavior During Therapy [facility] for tasks assessed/performed          Past Medical History:  Diagnosis Date   Hypertension    Obesity    Past Surgical History:  Procedure Laterality Date   ACHILLES TENDON REPAIR Left 2011   CESAREAN SECTION     LAPAROSCOPIC GASTRIC SLEEVE RESECTION N/A 06/15/2018   Procedure: LAPAROSCOPIC GASTRIC SLEEVE RESECTION, UPPER ENDO, ERAS PATHWAY;  Surgeon: Tanda Locus, MD;  Location: [facility];  Service: General;  Laterality: N/A;   TUBAL LIGATION     Patient Active Problem List   Diagnosis Date Noted   Chronic midline low back pain with sciatica 02/29/2024   Mixed hyperlipidemia 02/29/2024   Iron deficiency anemia secondary to inadequate dietary iron intake 02/29/2024   Right foot pain 02/23/2024   Vitamin D  deficiency 07/21/2023   Encounter for annual health examination 07/21/2023   H/O gastric sleeve 07/21/2023   COVID-19 vaccine administered 07/21/2023   Chronic pain of left knee 04/30/2023   Class 2 obesity due to excess calories with body mass index (BMI) of 38.0 to 38.9 in adult 04/30/2023   Need for influenza vaccination 04/30/2023   Other intervertebral disc degeneration, lumbar region 09/26/2021   OSA (obstructive sleep apnea) 06/15/2018   Chronic lumbosacral pain 06/15/2018   Essential hypertension 05/24/2018   Snoring 01/06/2018   Morbid obesity with BMI of 40.0-44.9, adult (HCC) 01/06/2018    PCP: Gaines Ada FNP  REFERRING PROVIDER: Ronal LITTIE Grave Pa-C  REFERRING DIAG: M54.16  (ICD-10-CM) - Radiculopathy, lumbar region   Rationale for Evaluation and Treatment: Rehabilitation  THERAPY DIAG:  Other low back pain  Chronic pain of right knee  Muscle weakness (generalized)  ONSET DATE: chronic  SUBJECTIVE:                                                                                                                                                                                           SUBJECTIVE STATEMENT: No changes just stiff today  Initial subjective  I have not been keeping up with the exercises. When it comes back it comes  back with a vengeance. Have all sorts issues, knees OA, right foot plantar fascitis. I seem to hurt up in my shoulders. It seems to be all on right side. Have had steriod and gel injections in both knees.  Not much relief.  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 Yes: NPRS scale: current 3/10; worst 8/10; least 1-2/10 Pain location: LB into both hips/ knees Pain description: ache burn into right mid thigh Aggravating factors: standing causing burn x 10 minutes, first thing in am Relieving factors: sitting  PRECAUTIONS: None  RED FLAGS: None   WEIGHT BEARING RESTRICTIONS: No  FALLS:  Has patient fallen in last 6 months? No  LIVING ENVIRONMENT: Lives with: lives with their family Lives in: House/apartment Stairs: Yes: Internal: 16 steps; on right going up Has following equipment at home: None   OCCUPATION: attorney, sitting   PLOF: Independent   PATIENT GOALS increase strength, decrease pain, get back to exercise program  PATIENT GOALS: get back strengthening in all areas, reduce pain, get back to exercises  NEXT MD VISIT: as needed  OBJECTIVE:  Note: Objective measures were completed at Evaluation unless otherwise noted.  DIAGNOSTIC FINDINGS:  Previous MRI scan 09[place]18/[place] includes significant lower lumbar  degenerative changes with disc desiccation 5 mm AP diameter canal at L4-5 severe right moderate left lateral recess narrowing.  Moderate foraminal encroachment.  L5-S1 disc bulge right greater than left extraforaminal disc extension.  Central canal without stenosis.  Moderate left and moderate to severe right foraminal encroachment.   PATIENT SURVEYS:  ODI: 28/50=56%    MUSCLE LENGTH: Hamstrings: wfl   POSTURE: increased lumbar lordosis, anterior pelvic tilt, right pelvic obliquity, and weight shift left  PALPATION: Mod TTP throughout lumbar paraspinals R>L and right sciatic notch glut area  LUMBAR ROM:   AROM eval  Flexion Full P!  Extension full  Right lateral flexion Full P!  Left lateral flexion Full P!  Right rotation   Left rotation    (Blank rows = not tested)  LOWER EXTREMITY ROM:    WFL  LOWER EXTREMITY MMT:    MMT Right eval Left eval  Hip flexion 45.5 56.3  Hip extension    Hip abduction 31.8 35.0  Hip adduction    Hip internal rotation    Hip external rotation    Knee flexion    Knee extension 32.0 41.1  Ankle dorsiflexion    Ankle plantarflexion    Ankle inversion    Ankle eversion     (Blank rows = not tested)  LUMBAR SPECIAL TESTS:  Slump test: Negative  FUNCTIONAL TESTS:  TUG: 14.38 4 stage balance Passed x4  GAIT: Distance walked: 500 ft Assistive device utilized: None Level of assistance: Complete Independence Comments: offloading left  TREATMENT  [facility] Adult PT Treatment:                                                DATE: 08/18/24 Pt seen for aquatic therapy today.  Treatment took place in water  3.5-4.75 ft in depth at the [facility] pool. Temp of water  was 91.  Pt entered/exited the pool via stairs using alternating pattern with hand rail.  *Intro to setting *walking forward, back and side stepping in 3.6 ft with unsupported *side stepping ue  add/abd RBHB *L stretch-> hip hiking *Hamstring/quad and gastroc stretch  at steps *Ue support on wall: toe raises; heel raises; hip add/abd; hip extension; relaxed squats (some sciatic nerve sensation with rle abd and ext) *decompression position with noodle wrapped posteriorly across chest: cycling; hip add/abd  Pt requires the buoyancy and hydrostatic pressure of water  for support, and to offload joints by unweighting joint load by at least 50 % in navel deep water  and by at least 75-80% in chest to neck deep water .  Viscosity of the water  is needed for resistance of strengthening. Water  current perturbations provides challenge to standing balance requiring increased core activation.                                                                                                                                    PATIENT EDUCATION:  Education details: Discussed eval findings, rehab rationale, aquatic program progression/POC and pools in area. Patient is in agreement  Person educated: Patient Education method: Explanation Education comprehension: verbalized understanding  HOME EXERCISE PROGRAM: TBA  ASSESSMENT:  CLINICAL IMPRESSION: Pt demonstrates safety and independence in aquatic setting with therapist instructing from deck. She is confident in setting, moving throughout all depths easily.  Pt is directed through various movement patterns and trials in both sitting and standing positions.   She is provided VC and demonstration throughout session for execution of exercises and stretching while monitoring toleration. Instruction/VC on core engagement throughout session for stabilization as well as strengthening. Good toleration to initial session, particular relief from extensive stretching. No complaints of PF, R knee or LB pain. Goals are ongoing.    Initial Impression Patient is a 48 y.o. f who was seen today for physical therapy evaluation and treatment for LBP and right knee pain. Pt is well known to this clinic having completed an episode for similar dx  ~ 2 yrs ago.  Reports had been doing well until other issues surfaced with bilat knee pain, plantar fasciitis and then right sided sciatic nerve flare. Stopped going to [employer].  She  presents with pain limited deficits in right sided lumbosacral area into right hip with occasional burning into right post thigh.  Pain limits endurance, activity tolerance, gait, strength and functional mobility with ADL's. She reports lack of tolerance to exercise at gym.  She will benefit from skilled PT intervention in both aquatic and land settings. Plan to initiate in aquatic to reduce pain and guarding, improving movement patterns and ROM then will progress to land for optimal strengthening. Note: pt instructed on icing and stretching for plantar fasciitis and approp footware   OBJECTIVE IMPAIRMENTS: decreased activity tolerance, decreased endurance, decreased mobility, difficulty walking, decreased strength, and pain.   ACTIVITY LIMITATIONS: carrying, lifting, bending, sitting, standing, squatting, sleeping, stairs, transfers, and locomotion level  PARTICIPATION LIMITATIONS: cleaning, laundry, shopping, community activity, and yard work  PERSONAL FACTORS: 1-2 comorbidities: see PmHx are also affecting patient's functional outcome.  REHAB POTENTIAL: Good  CLINICAL DECISION MAKING: Evolving/moderate complexity  EVALUATION COMPLEXITY: Low   GOALS: Goals reviewed with patient? Yes  SHORT TERM GOALS: Target date: 09/06/24  Pt will tolerate full aquatic sessions consistently without increase in pain and with improving function to demonstrate good toleration and effectiveness of intervention.  Baseline: Goal status: INITIAL  2.  Pt will report return to [facility] to begin indep routine exerises Baseline: stopped Goal status: INITIAL  3.  Pt will report reduced waking at night due to pain Baseline:  Goal status: INITIAL  4.  Pt will report ability to tolerate standing x >30 mins Baseline:  Goal status:  INITIAL    LONG TERM GOALS: Target date: 10/01/24  Pt to improve on ODI by 13% to demonstrate statistically significant Improvement in function. (MCID 13-15%) Baseline: 28/50=56% Goal status: INITIAL  2.  Pt will improve strength in all areas listed by at least 5 lbs to demonstrate improved overall physical function Baseline:  Goal status: INITIAL  3.  Pt will report decrease in pain by at least 50% for improved toleration to activity/quality of life and to demonstrate improved management of pain. Baseline:  Goal status: INITIAL  4.  Pt will report return to [facility] and routine exercise regimen Baseline:  Goal status: INITIAL  5.  Pt will be indep with final HEP's (land and aquatic as appropriate) for continued management of condition Baseline:  Goal status: INITIAL    PLAN:  PT FREQUENCY: 2x/week  PT DURATION: 8 weeks  PLANNED INTERVENTIONS: 97164- PT Re-evaluation, 97750- Physical Performance Testing, 97110-Therapeutic exercises, 97530- Therapeutic activity, 97112- Neuromuscular re-education, 97535- Self Care, 02859- Manual therapy, [id number]- Gait training, [phone]- Aquatic Therapy, [phone]- Electrical stimulation (unattended), [phone]- Electrical stimulation (manual), 20560 (1-2 muscles), 20561 (3+ muscles)- Dry Needling, Patient/Family education, Balance training, Stair training, Taping, Joint mobilization, DME instructions, Cryotherapy, and Moist heat.  PLAN FOR NEXT SESSION: aquatic initially core and LE strengthening; stretching and positioning for pain relief/management,   Ronal Foots) Auna Mikkelsen MPT 08/18/2024 8:42 AM [facility] [address] [place], [place], [address] Phone: [phone]   Fax:  [phone]   "

## 2024-08-20 ENCOUNTER — Telehealth (HOSPITAL_BASED_OUTPATIENT_CLINIC_OR_DEPARTMENT_OTHER): Payer: Self-pay | Admitting: Physical Therapy

## 2024-08-20 ENCOUNTER — Ambulatory Visit (HOSPITAL_BASED_OUTPATIENT_CLINIC_OR_DEPARTMENT_OTHER): Admitting: Physical Therapy

## 2024-08-20 NOTE — Telephone Encounter (Signed)
 Patient did not show for aquatic PT appointment.  Called and left voicemail regarding missed appointment and requested that patient return phone call to confirm next upcoming appointment.  [phone].  Per attendance policy: If patient has 2 occasions of late cancellation (less than 24 hr notice) or no-show, patient will be allowed to schedule one appointment at a time.  After the 3rd incident of late cancellation or no-show, patient will be discharged and require a new referral from physician to resume treatment.   Delon Aquas, PTA 08/20/2024 9:05 AM [facility] [address] [place], [place], [address] Phone: [phone]   Fax:  [phone]

## 2024-08-27 ENCOUNTER — Ambulatory Visit (HOSPITAL_BASED_OUTPATIENT_CLINIC_OR_DEPARTMENT_OTHER): Attending: Physician Assistant | Admitting: Physical Therapy

## 2024-08-27 ENCOUNTER — Encounter (HOSPITAL_BASED_OUTPATIENT_CLINIC_OR_DEPARTMENT_OTHER): Payer: Self-pay | Admitting: Physical Therapy

## 2024-08-27 DIAGNOSIS — M5459 Other low back pain: Secondary | ICD-10-CM | POA: Diagnosis present

## 2024-08-27 DIAGNOSIS — R293 Abnormal posture: Secondary | ICD-10-CM | POA: Insufficient documentation

## 2024-08-27 DIAGNOSIS — M25561 Pain in right knee: Secondary | ICD-10-CM | POA: Diagnosis present

## 2024-08-27 DIAGNOSIS — G8929 Other chronic pain: Secondary | ICD-10-CM | POA: Diagnosis present

## 2024-08-27 DIAGNOSIS — M6281 Muscle weakness (generalized): Secondary | ICD-10-CM | POA: Diagnosis present

## 2024-08-27 NOTE — Therapy (Signed)
 " OUTPATIENT PHYSICAL THERAPY THORACOLUMBAR TREATMENT   Patient Name: Maria Bryan MRN: [id number] DOB:10-29-1975, 49 y.o., female Today's Date: 08/27/2024  END OF SESSION:  PT End of Session - 08/27/24 0939     Visit Number 3    Date for Recertification  10/01/24    Authorization Type [employer]    PT Start Time [phone]   pt arrived late   PT Stop Time 1018    PT Time Calculation (min) 38 min    Activity Tolerance Patient tolerated treatment well    Behavior During Therapy [facility] for tasks assessed/performed          Past Medical History:  Diagnosis Date   Hypertension    Obesity    Past Surgical History:  Procedure Laterality Date   ACHILLES TENDON REPAIR Left 2011   CESAREAN SECTION     LAPAROSCOPIC GASTRIC SLEEVE RESECTION N/A 06/15/2018   Procedure: LAPAROSCOPIC GASTRIC SLEEVE RESECTION, UPPER ENDO, ERAS PATHWAY;  Surgeon: Tanda Locus, MD;  Location: [facility];  Service: General;  Laterality: N/A;   TUBAL LIGATION     Patient Active Problem List   Diagnosis Date Noted   Chronic midline low back pain with sciatica 02/29/2024   Mixed hyperlipidemia 02/29/2024   Iron deficiency anemia secondary to inadequate dietary iron intake 02/29/2024   Right foot pain 02/23/2024   Vitamin D  deficiency 07/21/2023   Encounter for annual health examination 07/21/2023   H/O gastric sleeve 07/21/2023   COVID-19 vaccine administered 07/21/2023   Chronic pain of left knee 04/30/2023   Class 2 obesity due to excess calories with body mass index (BMI) of 38.0 to 38.9 in adult 04/30/2023   Need for influenza vaccination 04/30/2023   Other intervertebral disc degeneration, lumbar region 09/26/2021   OSA (obstructive sleep apnea) 06/15/2018   Chronic lumbosacral pain 06/15/2018   Essential hypertension 05/24/2018   Snoring 01/06/2018   Morbid obesity with BMI of 40.0-44.9, adult (HCC) 01/06/2018    PCP: Gaines Ada FNP  REFERRING PROVIDER: Ronal LITTIE Grave Pa-C  REFERRING DIAG:  M54.16 (ICD-10-CM) - Radiculopathy, lumbar region   Rationale for Evaluation and Treatment: Rehabilitation  THERAPY DIAG:  Other low back pain  Chronic pain of right knee  Muscle weakness (generalized)  Abnormal posture  ONSET DATE: chronic  SUBJECTIVE:                                                                                                                                                                                           SUBJECTIVE STATEMENT: Pt reports she had increased soreness for 2 days after last aquatic therapy session.  Used tylenol   and heating pad to lower back for relief.   Initial subjective  I have not been keeping up with the exercises. When it comes back it comes back with a vengeance. Have all sorts issues, knees OA, right foot plantar fascitis. I seem to hurt up in my shoulders. It seems to be all on right side. Have had steriod and gel injections in both knees.  Not much relief.  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 Yes: NPRS scale: current 5/10;  Pain location: LB into both hips/ knees Pain description: ache, burn  Aggravating factors: standing causing burn x 10 minutes, first thing in am Relieving factors: sitting  PRECAUTIONS: None  RED FLAGS: None   WEIGHT BEARING RESTRICTIONS: No  FALLS:  Has patient fallen in last 6 months? No  LIVING ENVIRONMENT: Lives with: lives with their family Lives in: House/apartment Stairs: Yes: Internal: 16 steps; on right going up Has following equipment at home: None   OCCUPATION: attorney, sitting   PLOF: Independent   PATIENT GOALS increase strength, decrease pain, get back to exercise program  PATIENT GOALS: get back strengthening in all areas, reduce pain, get back to exercises  NEXT MD VISIT: as needed  OBJECTIVE:  Note: Objective measures were completed at Evaluation unless otherwise  noted.  DIAGNOSTIC FINDINGS:  Previous MRI scan [place] includes significant lower lumbar degenerative changes with disc desiccation 5 mm AP diameter canal at L4-5 severe right moderate left lateral recess narrowing.  Moderate foraminal encroachment.  L5-S1 disc bulge right greater than left extraforaminal disc extension.  Central canal without stenosis.  Moderate left and moderate to severe right foraminal encroachment.   PATIENT SURVEYS:  ODI: 28/50=56%    MUSCLE LENGTH: Hamstrings: wfl   POSTURE: increased lumbar lordosis, anterior pelvic tilt, right pelvic obliquity, and weight shift left  PALPATION: Mod TTP throughout lumbar paraspinals R>L and right sciatic notch glut area  LUMBAR ROM:   AROM eval  Flexion Full P!  Extension full  Right lateral flexion Full P!  Left lateral flexion Full P!  Right rotation   Left rotation    (Blank rows = not tested)  LOWER EXTREMITY ROM:    WFL  LOWER EXTREMITY MMT:    MMT Right eval Left eval  Hip flexion 45.5 56.3  Hip extension    Hip abduction 31.8 35.0  Hip adduction    Hip internal rotation    Hip external rotation    Knee flexion    Knee extension 32.0 41.1  Ankle dorsiflexion    Ankle plantarflexion    Ankle inversion    Ankle eversion     (Blank rows = not tested)  LUMBAR SPECIAL TESTS:  Slump test: Negative  FUNCTIONAL TESTS:  TUG: 14.38 4 stage balance Passed x4  GAIT: Distance walked: 500 ft Assistive device utilized: None Level of assistance: Complete Independence Comments: offloading left  TREATMENT  [facility] Adult PT Treatment:                                                DATE: 08/27/24 Pt seen for aquatic therapy today.  Treatment took place in water  3.5-4.75 ft in depth at the [facility] pool. Temp of water  was 91.  Pt entered/exited the pool via stairs independently with  bil hand rail.  *unsupported walking forward and backward multiple laps *side stepping  unsupported -> with arm add/abd using rainbow hand floats ->horiz abdct/ add  * suitcase carry with bil rainbow hand floats under water  at side, marching forward/ backward *UE support on wall: heel/toe raises x10; hip add/abd 2x5; leg swings into hip flexion and extension 2x5; * straddling noodle, UE On corner:  cycling, hip abdct/ add, hip extension/flexion, cycling   Pt requires the buoyancy and hydrostatic pressure of water  for support, and to offload joints by unweighting joint load by at least 50 % in navel deep water  and by at least 75-80% in chest to neck deep water .  Viscosity of the water  is needed for resistance of strengthening. Water  current perturbations provides challenge to standing balance requiring increased core activation.                                                                                                                          PATIENT EDUCATION:  Education details: reacquainting to aquatic therapy; issued copy of previous HEP (08/27/24) Person educated: Patient Education method: Explanation Education comprehension: verbalized understanding  HOME EXERCISE PROGRAM: Land exercise from previous episode:  [id number]  ASSESSMENT:  CLINICAL IMPRESSION: Pt reported mild increase in RLE symptoms with hip abdct (when in Lt SLS, but not when suspended). Otherwise no increase in pain during session.  Printed and issued land HEP from previous episode and encouraged pt to complete these exercises in between visits.  Pt was reminded of attendance policy; verbalized understanding. Goals are ongoing.    Initial Impression Patient is a 49 y.o. f who was seen today for physical therapy evaluation and treatment for LBP and right knee pain. Pt is well known to this clinic having completed an episode for similar dx ~ 2 yrs ago.  Reports had been doing well until other issues surfaced with bilat knee pain, plantar fasciitis and then right sided sciatic nerve flare. Stopped going to  [employer].  She  presents with pain limited deficits in right sided lumbosacral area into right hip with occasional burning into right post thigh.  Pain limits endurance, activity tolerance, gait, strength and functional mobility with ADL's. She reports lack of tolerance to exercise at gym.  She will benefit from skilled PT intervention in both aquatic and land settings. Plan to initiate in aquatic to reduce pain and guarding, improving movement patterns and ROM then will progress to land for optimal strengthening. Note: pt instructed on icing and stretching for plantar fasciitis and approp footware   OBJECTIVE IMPAIRMENTS: decreased activity tolerance, decreased endurance, decreased mobility, difficulty walking, decreased strength, and pain.   ACTIVITY LIMITATIONS: carrying, lifting, bending, sitting, standing, squatting, sleeping, stairs, transfers, and locomotion level  PARTICIPATION LIMITATIONS: cleaning, laundry, shopping, community activity, and yard work  PERSONAL FACTORS: 1-2 comorbidities: see PmHx are also affecting patient's functional outcome.   REHAB POTENTIAL: Good  CLINICAL DECISION MAKING: Evolving/moderate complexity  EVALUATION COMPLEXITY: Low   GOALS: Goals  reviewed with patient? Yes  SHORT TERM GOALS: Target date: 09/06/24  Pt will tolerate full aquatic sessions consistently without increase in pain and with improving function to demonstrate good toleration and effectiveness of intervention.  Baseline: Goal status: INITIAL  2.  Pt will report return to [facility] to begin indep routine exerises Baseline: stopped Goal status: INITIAL  3.  Pt will report reduced waking at night due to pain Baseline:  Goal status: INITIAL  4.  Pt will report ability to tolerate standing x >30 mins Baseline:  Goal status: INITIAL    LONG TERM GOALS: Target date: 10/01/24  Pt to improve on ODI by 13% to demonstrate statistically significant Improvement in function. (MCID  13-15%) Baseline: 28/50=56% Goal status: INITIAL  2.  Pt will improve strength in all areas listed by at least 5 lbs to demonstrate improved overall physical function Baseline:  Goal status: INITIAL  3.  Pt will report decrease in pain by at least 50% for improved toleration to activity/quality of life and to demonstrate improved management of pain. Baseline:  Goal status: INITIAL  4.  Pt will report return to [facility] and routine exercise regimen Baseline:  Goal status: INITIAL  5.  Pt will be indep with final HEP's (land and aquatic as appropriate) for continued management of condition Baseline:  Goal status: INITIAL    PLAN:  PT FREQUENCY: 2x/week  PT DURATION: 8 weeks  PLANNED INTERVENTIONS: 97164- PT Re-evaluation, 97750- Physical Performance Testing, 97110-Therapeutic exercises, 97530- Therapeutic activity, 97112- Neuromuscular re-education, 97535- Self Care, 02859- Manual therapy, [id number]- Gait training, [phone]- Aquatic Therapy, [phone]- Electrical stimulation (unattended), [phone]- Electrical stimulation (manual), 20560 (1-2 muscles), 20561 (3+ muscles)- Dry Needling, Patient/Family education, Balance training, Stair training, Taping, Joint mobilization, DME instructions, Cryotherapy, and Moist heat.  PLAN FOR NEXT SESSION: aquatic initially core and LE strengthening; stretching and positioning for pain relief/management,   Delon Aquas, PTA 08/27/2024 10:37 AM [facility] [address] [place], [place], [address] Phone: [phone]   Fax:  [phone]   "

## 2024-08-30 ENCOUNTER — Telehealth (INDEPENDENT_AMBULATORY_CARE_PROVIDER_SITE_OTHER): Admitting: Nurse Practitioner

## 2024-08-30 ENCOUNTER — Encounter: Payer: Self-pay | Admitting: Nurse Practitioner

## 2024-08-30 DIAGNOSIS — Z6841 Body Mass Index (BMI) 40.0 and over, adult: Secondary | ICD-10-CM

## 2024-08-30 DIAGNOSIS — E559 Vitamin D deficiency, unspecified: Secondary | ICD-10-CM

## 2024-08-30 DIAGNOSIS — E782 Mixed hyperlipidemia: Secondary | ICD-10-CM

## 2024-08-30 DIAGNOSIS — Z79899 Other long term (current) drug therapy: Secondary | ICD-10-CM

## 2024-08-30 DIAGNOSIS — I10 Essential (primary) hypertension: Secondary | ICD-10-CM

## 2024-08-30 NOTE — Assessment & Plan Note (Signed)
 Chronic, will check lipid panel

## 2024-08-30 NOTE — Assessment & Plan Note (Signed)
 She reports her weight has been around 220 lbs but has been in plateau. I did make her aware there is an oral weight loss medication just recently approved by the FDA and she is to check back in a couple weeks to see if able to get for cash pay. She also reports after having her gastric sleeve she no longer had sleep apnea but I do not see a repeat test, initial was done in 2019 at that time she had moderate to severe sleep apnea. She also had her gastric sleeve in 2019

## 2024-08-30 NOTE — Assessment & Plan Note (Signed)
 Will check vitamin D  level and supplement as needed.   She is taking a supplement for vitamin d  which is incorporated in a bariatric supplement package.

## 2024-08-30 NOTE — Assessment & Plan Note (Signed)
 She has not been taking the labetolol, has only been taking the amlodipine . She will have her blood pressure checked when she comes for her labs tomorrow but feels it has been well controlled.

## 2024-08-30 NOTE — Progress Notes (Signed)
 "  Virtual Visit via Video Note  I,Jameka J Llittleton, CMA,acting as a scribe for Gaines Ada, FNP.,have documented all relevant documentation on the behalf of Gaines Ada, FNP,as directed by  Gaines Ada, FNP while in the presence of Gaines Ada, FNP.  I connected with Maria Bryan on 08/30/2024 at 10:00 AM EST by a video enabled telemedicine application and verified that I am speaking with the correct person using two identifiers.  Patient Location: Home Provider Location: Home Office  I discussed the limitations, risks, security, and privacy concerns of performing an evaluation and management service by video and the availability of in person appointments. I also discussed with the patient that there may be a patient responsible charge related to this service. The patient expressed understanding and agreed to proceed.  Subjective: PCP: Ada Gaines, FNP  Chief Complaint  Patient presents with   Hypertension    Patient presents today for a bpc. Patient reports compliance with meds. She denies having any chest pain, sob or headaches at this time. Patient doesn't have any questions or concerns.   Virtual visit for follow up of hypertension. She continues to have struggles with her orthopedic issues. She continues with aquatic PT and feels like cyclic when transitioning to land. Not able to stand for long times, will have to take several breaks such as with cleaning. If she does not have a massage chair she has more aches and pains. Continues to have menstrual cycles, she is having different premenstrual cycles and feels like she has more joint pain during that time.   She expresses she is not eating as many sweets. She has cut back on her carbs by eating half. She is eating better. Her most recent at home was 220 lbs consistently - 2019 had gastric sleeve. She is eating more fruit and more vegetables. Has not been eating pork. She will switch to eat egg whites.   She has  stopped taking the labetolol and only taking amlodipine . She feels like she is doing well.    Wt Readings from Last 3 Encounters:  03/25/24 238 lb 8 oz (108.2 kg)  02/23/24 243 lb 9.6 oz (110.5 kg)  07/16/23 229 lb 12.8 oz (104.2 kg)     ROS: Per HPI Current Medications[1]  Observations/Objective: Today's Vitals   Physical Exam Vitals reviewed.  Constitutional:      General: She is not in acute distress.    Appearance: Normal appearance. She is obese.  Pulmonary:     Effort: Pulmonary effort is normal. No respiratory distress.  Skin:    Capillary Refill: Capillary refill takes less than 2 seconds.  Neurological:     General: No focal deficit present.     Mental Status: She is alert and oriented to person, place, and time.     Cranial Nerves: No cranial nerve deficit.  Psychiatric:        Mood and Affect: Mood normal.        Behavior: Behavior normal.        Thought Content: Thought content normal.        Judgment: Judgment normal.     Assessment and Plan: Essential hypertension Assessment & Plan: She has not been taking the labetolol, has only been taking the amlodipine . She will have her blood pressure checked when she comes for her labs tomorrow but feels it has been well controlled.   Orders: -     CMP14+EGFR; Future -     Microalbumin / creatinine urine  ratio; Future  Mixed hyperlipidemia Assessment & Plan: Chronic, will check lipid panel.  Orders: -     Lipid panel; Future  Vitamin D  deficiency Assessment & Plan: Will check vitamin D  level and supplement as needed.   She is taking a supplement for vitamin d  which is incorporated in a bariatric supplement package.   Orders: -     VITAMIN D  25 Hydroxy (Vit-D Deficiency, Fractures); Future  Morbid obesity with BMI of 40.0-44.9, adult [facility]) Assessment & Plan: She reports her weight has been around 220 lbs but has been in plateau. I did make her aware there is an oral weight loss medication just recently  approved by the FDA and she is to check back in a couple weeks to see if able to get for cash pay. She also reports after having her gastric sleeve she no longer had sleep apnea but I do not see a repeat test, initial was done in 2019 at that time she had moderate to severe sleep apnea. She also had her gastric sleeve in 2019  Orders: -     Hemoglobin A1c; Future  Other long term (current) drug therapy -     CBC; Future    Follow Up Instructions: Return in about 3 months (around 11/28/2024) for bpc.   I discussed the assessment and treatment plan with the patient. The patient was provided an opportunity to ask questions, and all were answered. The patient agreed with the plan and demonstrated an understanding of the instructions.   The patient was advised to call back or seek an in-person evaluation if the symptoms worsen or if the condition fails to improve as anticipated.  The above assessment and management plan was discussed with the patient. The patient verbalized understanding of and has agreed to the management plan.   LILLETTE Gaines Ada, FNP, have reviewed all documentation for this visit. The documentation on 08/30/2024 for the exam, diagnosis, procedures, and orders are all accurate and complete.      [1]  Current Outpatient Medications:    acetaminophen  (TYLENOL ) 500 MG tablet, Take 1,000 mg by mouth daily as needed for moderate pain or headache., Disp: , Rfl:    amLODipine  (NORVASC ) 10 MG tablet, TAKE 1 TABLET DAILY, Disp: 90 tablet, Rfl: 1   calcium carbonate (OS-CAL) 600 MG TABS tablet, Take by mouth., Disp: , Rfl:    Clindamycin-Benzoyl Per, Refr, gel, , Disp: , Rfl:    cyclobenzaprine (FLEXERIL) 10 MG tablet, Take 10 mg by mouth daily as needed for muscle spasms., Disp: , Rfl:    Dapsone  7.5 % GEL, Apply 1 application  topically daily as needed (acne)., Disp: 1 g, Rfl: 1   Fluocinolone Acetonide Scalp 0.01 % OIL, Apply 1 application topically every 14 (fourteen) days., Disp: ,  Rfl:    fluocinonide  ointment (LIDEX ) 0.05 %, Apply 1 Application topically every other day., Disp: 30 g, Rfl: 1   hydrocortisone  2.5 % cream, Apply 1 Application topically every other day., Disp: 30 g, Rfl: 1   methocarbamol  (ROBAXIN ) 750 MG tablet, Take 1 tablet (750 mg total) by mouth 3 (three) times daily as needed., Disp: 30 tablet, Rfl: 2   Multiple Vitamins-Minerals (OPURITY PO), Take 1 capsule by mouth., Disp: , Rfl:    PRESCRIPTION MEDICATION, Apply 1 application topically daily as needed (pain). diclofenac 3% baclofen 2% lidocaine  5% Menthol 1%, Disp: , Rfl:    traMADol  (ULTRAM ) 50 MG tablet, Take 1 tablet (50 mg total) by mouth every 12 (twelve)  hours as needed., Disp: 30 tablet, Rfl: 1   tretinoin  (RETIN-A ) 0.025 % cream, Apply 1 application  topically at bedtime., Disp: 45 g, Rfl: 1  "

## 2024-08-30 NOTE — Patient Instructions (Signed)

## 2024-09-01 ENCOUNTER — Ambulatory Visit: Admitting: Dietician

## 2024-09-03 ENCOUNTER — Ambulatory Visit (HOSPITAL_BASED_OUTPATIENT_CLINIC_OR_DEPARTMENT_OTHER): Admitting: Physical Therapy

## 2024-09-06 ENCOUNTER — Encounter (HOSPITAL_BASED_OUTPATIENT_CLINIC_OR_DEPARTMENT_OTHER): Payer: Self-pay | Admitting: Physical Therapy

## 2024-09-06 ENCOUNTER — Ambulatory Visit (HOSPITAL_BASED_OUTPATIENT_CLINIC_OR_DEPARTMENT_OTHER): Admitting: Physical Therapy

## 2024-09-06 DIAGNOSIS — M5459 Other low back pain: Secondary | ICD-10-CM

## 2024-09-06 DIAGNOSIS — M6281 Muscle weakness (generalized): Secondary | ICD-10-CM

## 2024-09-06 DIAGNOSIS — G8929 Other chronic pain: Secondary | ICD-10-CM

## 2024-09-06 DIAGNOSIS — R293 Abnormal posture: Secondary | ICD-10-CM

## 2024-09-06 NOTE — Therapy (Signed)
 " OUTPATIENT PHYSICAL THERAPY THORACOLUMBAR TREATMENT   Patient Name: Maria Bryan MRN: [id number] DOB:09-06-1975, 49 y.o., female Today's Date: 09/06/2024  END OF SESSION:  PT End of Session - 09/06/24 0858     Visit Number 4    Date for Recertification  10/01/24    [place]ization Type [employer] [place]    PT Start Time [phone]   pt arrived late   PT Stop Time 0929    PT Time Calculation (min) 39 min    Behavior During Therapy [facility] for tasks assessed/performed          Past Medical History:  Diagnosis Date   Hypertension    Obesity    Past Surgical History:  Procedure Laterality Date   ACHILLES TENDON REPAIR Left 2011   CESAREAN SECTION     LAPAROSCOPIC GASTRIC SLEEVE RESECTION N/A 06/15/2018   Procedure: LAPAROSCOPIC GASTRIC SLEEVE RESECTION, UPPER ENDO, ERAS PATHWAY;  Surgeon: Tanda Locus, MD;  Location: [facility];  Service: General;  Laterality: N/A;   TUBAL LIGATION     Patient Active Problem List   Diagnosis Date Noted   Chronic midline low back pain with sciatica 02/29/2024   Mixed hyperlipidemia 02/29/2024   Iron deficiency anemia secondary to inadequate dietary iron intake 02/29/2024   Right foot pain 02/23/2024   Vitamin D  deficiency 07/21/2023   Encounter for annual health examination 07/21/2023   H/O gastric sleeve 07/21/2023   COVID-19 vaccine administered 07/21/2023   Chronic pain of left knee 04/30/2023   Class 2 obesity due to excess calories with body mass index (BMI) of 38.0 to 38.9 in adult 04/30/2023   Other intervertebral disc degeneration, lumbar region 09/26/2021   OSA (obstructive sleep apnea) 06/15/2018   Chronic lumbosacral pain 06/15/2018   Essential hypertension 05/24/2018   Snoring 01/06/2018   Morbid obesity with BMI of 40.0-44.9, adult (HCC) 01/06/2018    PCP: Gaines Ada FNP  REFERRING PROVIDER: Ronal LITTIE Grave Pa-C  REFERRING DIAG: M54.16 (ICD-10-CM) - Radiculopathy, lumbar region   Rationale for Evaluation and Treatment:  Rehabilitation  THERAPY DIAG:  Other low back pain  Chronic pain of right knee  Muscle weakness (generalized)  Abnormal posture  ONSET DATE: chronic  SUBJECTIVE:                                                                                                                                                                                           SUBJECTIVE STATEMENT: Pt reports she did better after last aquatic session, not as much soreness.  She reports that she has been icing her Rt foot often and this, along with wearing oofo slides has  helped Rt foot pain.  Pt reports she started completing some of HEP exercises at night and this has helped.  Initial subjective  I have not been keeping up with the exercises. When it comes back it comes back with a vengeance. Have all sorts issues, knees OA, right foot plantar fascitis. I seem to hurt up in my shoulders. It seems to be all on right side. Have had steriod and gel injections in both knees.  Not much relief.  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 Yes: NPRS scale: 8/10 Rt knee, LB and hips 6/10;  Pain location: see above Pain description: ache, burn  Aggravating factors: standing causing burn x 10 minutes, first thing in am Relieving factors: sitting  PRECAUTIONS: None  RED FLAGS: None   WEIGHT BEARING RESTRICTIONS: No  FALLS:  Has patient fallen in last 6 months? No  LIVING ENVIRONMENT: Lives with: lives with their family Lives in: House/apartment Stairs: Yes: Internal: 16 steps; on right going up Has following equipment at home: None   OCCUPATION: attorney, sitting   PLOF: Independent   PATIENT GOALS increase strength, decrease pain, get back to exercise program  PATIENT GOALS: get back strengthening in all areas, reduce pain, get back to exercises  NEXT MD VISIT: as needed  OBJECTIVE:  Note: Objective measures were  completed at Evaluation unless otherwise noted.  DIAGNOSTIC FINDINGS:  Previous MRI [place]18[place] includes significant lower lumbar degenerative changes with disc desiccation 5 mm AP diameter canal at L4-5 severe right moderate left lateral recess narrowing.  Moderate foraminal encroachment.  L5-S1 disc bulge right greater than left extraforaminal disc extension.  Central canal without stenosis.  Moderate left and moderate to severe right foraminal encroachment.   PATIENT SURVEYS:  ODI: 28/50=56%    MUSCLE LENGTH: Hamstrings: wfl   POSTURE: increased lumbar lordosis, anterior pelvic tilt, right pelvic obliquity, and weight shift left  PALPATION: Mod TTP throughout lumbar paraspinals R>L and right sciatic notch glut area  LUMBAR ROM:   AROM eval  Flexion Full P!  Extension full  Right lateral flexion Full P!  Left lateral flexion Full P!  Right rotation   Left rotation    (Blank rows = not tested)  LOWER EXTREMITY ROM:    WFL  LOWER EXTREMITY MMT:    MMT Right eval Left eval  Hip flexion 45.5 56.3  Hip extension    Hip abduction 31.8 35.0  Hip adduction    Hip internal rotation    Hip external rotation    Knee flexion    Knee extension 32.0 41.1  Ankle dorsiflexion    Ankle plantarflexion    Ankle inversion    Ankle eversion     (Blank rows = not tested)  LUMBAR SPECIAL TESTS:  Slump test: Negative  FUNCTIONAL TESTS:  TUG: 14.38 4 stage balance Passed x4  GAIT: Distance walked: 500 ft Assistive device utilized: None Level of assistance: Complete Independence Comments: offloading left  TREATMENT  [facility] Adult PT Treatment:                                                DATE: 09/06/24 Pt seen for aquatic therapy today.  Treatment took place in water  3.5-4.75 ft in depth at the [facility] pool. Temp  of water  was 91.  Pt entered/exited the pool via stairs independently with bil hand rail.  *unsupported walking forward and  backward multiple laps, cues for reciprocal arm swing *side stepping unsupported, cues for slight increase in step height * forward walking kicks * suitcase carry with bil rainbow hand floats under water  at side, marching forward/ backward * R/L quad stretch with foot on 2nd step behind her, 30sec x 2 each * seated stool scoots  forward/ backward * straddling noodle, UE On corner:  cycling, hip abdct/ add, hip extension/flexion, cycling - 3 rounds    Pt requires the buoyancy and hydrostatic pressure of water  for support, and to offload joints by unweighting joint load by at least 50 % in navel deep water  and by at least 75-80% in chest to neck deep water .  Viscosity of the water  is needed for resistance of strengthening. Water  current perturbations provides challenge to standing balance requiring increased core activation.                                                                                                                          PATIENT EDUCATION:  Education details: reacquainting to aquatic therapy;  Person educated: Patient Education method: Explanation Education comprehension: verbalized understanding  HOME EXERCISE PROGRAM: Land exercise from previous episode:  [id number] (reissued 08/27/24)  ASSESSMENT:  CLINICAL IMPRESSION: Pt reported ongoing discomfort in Rt knee during session.  Pt reported gradual reduction of pain during session.  Good response to quad stretch at stairs.  Will continue to progress as tolerated.  Goals are ongoing.    Initial Impression Patient is a 49 y.o. f who was seen today for physical therapy evaluation and treatment for LBP and right knee pain. Pt is well known to this clinic having completed an episode for similar dx ~ 2 yrs ago.  Reports had been doing well until other issues surfaced with bilat knee pain, plantar fasciitis and then right sided sciatic nerve flare. Stopped going to [employer].  She  presents with pain limited deficits in right  sided lumbosacral area into right hip with occasional burning into right post thigh.  Pain limits endurance, activity tolerance, gait, strength and functional mobility with ADL's. She reports lack of tolerance to exercise at gym.  She will benefit from skilled PT intervention in both aquatic and land settings. Plan to initiate in aquatic to reduce pain and guarding, improving movement patterns and ROM then will progress to land for optimal strengthening. Note: pt instructed on icing and stretching for plantar fasciitis and approp footware   OBJECTIVE IMPAIRMENTS: decreased activity tolerance, decreased endurance, decreased mobility, difficulty walking, decreased strength, and pain.   ACTIVITY LIMITATIONS: carrying, lifting, bending, sitting, standing, squatting, sleeping, stairs, transfers, and locomotion level  PARTICIPATION LIMITATIONS: cleaning, laundry, shopping, community activity, and yard work  PERSONAL FACTORS: 1-2 comorbidities: see PmHx are also affecting patient's functional outcome.   REHAB POTENTIAL: Good  CLINICAL DECISION MAKING: Evolving/moderate complexity  EVALUATION COMPLEXITY: Low  GOALS: Goals reviewed with patient? Yes  SHORT TERM GOALS: Target date: 09/06/24  Pt will tolerate full aquatic sessions consistently without increase in pain and with improving function to demonstrate good toleration and effectiveness of intervention.  Baseline: Goal status: INITIAL  2.  Pt will report return to [facility] to begin indep routine exerises Baseline: stopped Goal status: INITIAL  3.  Pt will report reduced waking at night due to pain Baseline:  Goal status: INITIAL  4.  Pt will report ability to tolerate standing x >30 mins Baseline:  Goal status: INITIAL    LONG TERM GOALS: Target date: 10/01/24  Pt to improve on ODI by 13% to demonstrate statistically significant Improvement in function. (MCID 13-15%) Baseline: 28/50=56% Goal status: INITIAL  2.  Pt will improve  strength in all areas listed by at least 5 lbs to demonstrate improved overall physical function Baseline:  Goal status: INITIAL  3.  Pt will report decrease in pain by at least 50% for improved toleration to activity/quality of life and to demonstrate improved management of pain. Baseline:  Goal status: INITIAL  4.  Pt will report return to [facility] and routine exercise regimen Baseline:  Goal status: INITIAL  5.  Pt will be indep with final HEP's (land and aquatic as appropriate) for continued management of condition Baseline:  Goal status: INITIAL    PLAN:  PT FREQUENCY: 2x/week  PT DURATION: 8 weeks  PLANNED INTERVENTIONS: 97164- PT Re-evaluation, 97750- Physical Performance Testing, 97110-Therapeutic exercises, 97530- Therapeutic activity, 97112- Neuromuscular re-education, 97535- Self Care, 02859- Manual therapy, [id number]- Gait training, [phone]- Aquatic Therapy, [phone]- Electrical stimulation (unattended), [phone]- Electrical stimulation (manual), 20560 (1-2 muscles), 20561 (3+ muscles)- Dry Needling, Patient/Family education, Balance training, Stair training, Taping, Joint mobilization, DME instructions, Cryotherapy, and Moist heat.  PLAN FOR NEXT SESSION: aquatic initially core and LE strengthening; stretching and positioning for pain relief/management,   Delon Aquas, PTA 09/06/2024 9:29 AM [facility] [address] [place], [place], [address] Phone: [phone]   Fax:  [phone]  "

## 2024-09-08 ENCOUNTER — Encounter (HOSPITAL_BASED_OUTPATIENT_CLINIC_OR_DEPARTMENT_OTHER): Payer: Self-pay | Admitting: Physical Therapy

## 2024-09-08 ENCOUNTER — Ambulatory Visit (HOSPITAL_BASED_OUTPATIENT_CLINIC_OR_DEPARTMENT_OTHER): Admitting: Physical Therapy

## 2024-09-08 DIAGNOSIS — G8929 Other chronic pain: Secondary | ICD-10-CM

## 2024-09-08 DIAGNOSIS — M5459 Other low back pain: Secondary | ICD-10-CM | POA: Diagnosis not present

## 2024-09-08 DIAGNOSIS — M6281 Muscle weakness (generalized): Secondary | ICD-10-CM

## 2024-09-08 NOTE — Therapy (Signed)
 " OUTPATIENT PHYSICAL THERAPY THORACOLUMBAR TREATMENT   Patient Name: Maria Bryan MRN: [id number] DOB:Sep 03, 1975, 49 y.o., female Today's Date: 09/08/2024  END OF SESSION:  PT End of Session - 09/08/24 0900     Visit Number 5    Date for Recertification  10/01/24    Authorization Type [employer] [place]    PT Start Time 0802    PT Stop Time 0844    PT Time Calculation (min) 42 min    Activity Tolerance Patient tolerated treatment well    Behavior During Therapy [facility] for tasks assessed/performed           Past Medical History:  Diagnosis Date   Hypertension    Obesity    Past Surgical History:  Procedure Laterality Date   ACHILLES TENDON REPAIR Left 2011   CESAREAN SECTION     LAPAROSCOPIC GASTRIC SLEEVE RESECTION N/A 06/15/2018   Procedure: LAPAROSCOPIC GASTRIC SLEEVE RESECTION, UPPER ENDO, ERAS PATHWAY;  Surgeon: Tanda Locus, MD;  Location: [facility];  Service: General;  Laterality: N/A;   TUBAL LIGATION     Patient Active Problem List   Diagnosis Date Noted   Chronic midline low back pain with sciatica 02/29/2024   Mixed hyperlipidemia 02/29/2024   Iron deficiency anemia secondary to inadequate dietary iron intake 02/29/2024   Right foot pain 02/23/2024   Vitamin D  deficiency 07/21/2023   Encounter for annual health examination 07/21/2023   H/O gastric sleeve 07/21/2023   COVID-19 vaccine administered 07/21/2023   Chronic pain of left knee 04/30/2023   Class 2 obesity due to excess calories with body mass index (BMI) of 38.0 to 38.9 in adult 04/30/2023   Other intervertebral disc degeneration, lumbar region 09/26/2021   OSA (obstructive sleep apnea) 06/15/2018   Chronic lumbosacral pain 06/15/2018   Essential hypertension 05/24/2018   Snoring 01/06/2018   Morbid obesity with BMI of 40.0-44.9, adult (HCC) 01/06/2018    PCP: Gaines Ada FNP  REFERRING PROVIDER: Ronal LITTIE Grave Pa-C  REFERRING DIAG: M54.16 (ICD-10-CM) - Radiculopathy, lumbar region    Rationale for Evaluation and Treatment: Rehabilitation  THERAPY DIAG:  Other low back pain  Chronic pain of right knee  Muscle weakness (generalized)  ONSET DATE: chronic  SUBJECTIVE:                                                                                                                                                                                           SUBJECTIVE STATEMENT: Minor muscle soreness after last session.  Pain today 7/10 mostly in Rt knee.    Initial subjective  I have not been keeping up with the exercises. When  it comes back it comes back with a vengeance. Have all sorts issues, knees OA, right foot plantar fascitis. I seem to hurt up in my shoulders. It seems to be all on right side. Have had steriod and gel injections in both knees.  Not much relief.  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 Yes: NPRS scale: 8/10 Rt knee, LB and hips 6/10;  Pain location: see above Pain description: ache, burn  Aggravating factors: standing causing burn x 10 minutes, first thing in am Relieving factors: sitting  PRECAUTIONS: None  RED FLAGS: None   WEIGHT BEARING RESTRICTIONS: No  FALLS:  Has patient fallen in last 6 months? No  LIVING ENVIRONMENT: Lives with: lives with their family Lives in: House/apartment Stairs: Yes: Internal: 16 steps; on right going up Has following equipment at home: None   OCCUPATION: attorney, sitting   PLOF: Independent   PATIENT GOALS increase strength, decrease pain, get back to exercise program  PATIENT GOALS: get back strengthening in all areas, reduce pain, get back to exercises  NEXT MD VISIT: as needed  OBJECTIVE:  Note: Objective measures were completed at Evaluation unless otherwise noted.  DIAGNOSTIC FINDINGS:  Previous MRI scan 09[place]18/[place] includes significant lower lumbar degenerative changes with disc  desiccation 5 mm AP diameter canal at L4-5 severe right moderate left lateral recess narrowing.  Moderate foraminal encroachment.  L5-S1 disc bulge right greater than left extraforaminal disc extension.  Central canal without stenosis.  Moderate left and moderate to severe right foraminal encroachment.   PATIENT SURVEYS:  ODI: 28/50=56%    MUSCLE LENGTH: Hamstrings: wfl   POSTURE: increased lumbar lordosis, anterior pelvic tilt, right pelvic obliquity, and weight shift left  PALPATION: Mod TTP throughout lumbar paraspinals R>L and right sciatic notch glut area  LUMBAR ROM:   AROM eval  Flexion Full P!  Extension full  Right lateral flexion Full P!  Left lateral flexion Full P!  Right rotation   Left rotation    (Blank rows = not tested)  LOWER EXTREMITY ROM:    WFL  LOWER EXTREMITY MMT:    MMT Right eval Left eval  Hip flexion 45.5 56.3  Hip extension    Hip abduction 31.8 35.0  Hip adduction    Hip internal rotation    Hip external rotation    Knee flexion    Knee extension 32.0 41.1  Ankle dorsiflexion    Ankle plantarflexion    Ankle inversion    Ankle eversion     (Blank rows = not tested)  LUMBAR SPECIAL TESTS:  Slump test: Negative  FUNCTIONAL TESTS:  TUG: 14.38 4 stage balance Passed x4  GAIT: Distance walked: 500 ft Assistive device utilized: None Level of assistance: Complete Independence Comments: offloading left  TREATMENT  [facility] Adult PT Treatment:                                                DATE: 09/08/24 Pt seen for aquatic therapy today.  Treatment took place in water  3.5-4.75 ft in depth at the [facility] pool. Temp of water  was 91.  Pt entered/exited the pool via stairs independently with bil hand rail.  *unsupported walking forward and backward multiple laps *suitcase carry using yellow HB bilateral then unilateral * forward/backward  walking with kicks  - squatted rest period * R/L quad stretch with foot on 2nd step  behind her, 30sec x 2 each *figure 4 stretch at steps (good stretch) * straddling noodle:  cycling; ue support corner wall: hip abdct/ add *decompression position with noodle wrapped posteriorly across chest: bilat Knees to chest x  5   Pt requires the buoyancy and hydrostatic pressure of water  for support, and to offload joints by  un weighting joint load by at least 50 % in navel deep water  and by at least 75-80% in chest to neck deep water .  Viscosity of the water  is needed for resistance of strengthening. Water  current perturbations provides challenge to standing balance requiring increased core activation.                                                                                                                          PATIENT EDUCATION:  Education details: reacquainting to aquatic therapy;  Person educated: Patient Education method: Explanation Education comprehension: verbalized understanding  HOME EXERCISE PROGRAM: Land exercise from previous episode:  [email or web address] (reissued 08/27/24)  ASSESSMENT:  CLINICAL IMPRESSION: Pt reports compliance with stretching at home. Instructed and demonstrated figure 4 stretch in sup for completion at home as well. Pt provided vc and demonstration for execution of exercises. Was able to reduce pain to 0/10 by end of session likely resetting pain cycle for overall reduction in pain sensitivity going forward. Goals ongoing     Initial Impression Patient is a 49 y.o. f who was seen today for physical therapy evaluation and treatment for LBP and right knee pain. Pt is well known to this clinic having completed an episode for similar dx ~ 2 yrs ago.  Reports had been doing well until other issues surfaced with bilat knee pain, plantar fasciitis and then right sided sciatic nerve flare. Stopped going to [employer].  She  presents with pain limited deficits in right sided lumbosacral area into right hip with occasional burning into right post thigh.  Pain  limits endurance, activity tolerance, gait, strength and functional mobility with ADL's. She reports lack of tolerance to exercise at gym.  She will benefit from skilled PT intervention in both aquatic and land settings. Plan to initiate in aquatic to reduce pain and guarding, improving movement patterns and ROM then will progress to land for optimal strengthening. Note: pt instructed on icing and stretching for plantar fasciitis and approp footware   OBJECTIVE IMPAIRMENTS: decreased activity tolerance, decreased endurance, decreased mobility, difficulty walking, decreased strength, and pain.   ACTIVITY LIMITATIONS: carrying, lifting, bending, sitting, standing, squatting, sleeping, stairs, transfers, and locomotion level  PARTICIPATION LIMITATIONS: cleaning, laundry, shopping, community activity, and yard work  PERSONAL FACTORS: 1-2 comorbidities: see PmHx are also affecting patient's functional outcome.   REHAB POTENTIAL: Good  CLINICAL DECISION MAKING: Evolving/moderate complexity  EVALUATION COMPLEXITY: Low   GOALS: Goals reviewed with patient? Yes  SHORT TERM GOALS: Target date: 09/06/24  Pt will tolerate full aquatic  sessions consistently without increase in pain and with improving function to demonstrate good toleration and effectiveness of intervention.  Baseline: Goal status:Met 09/08/24  2.  Pt will report return to [facility] to begin indep routine exerises Baseline: stopped Goal status: In process 09/08/24  3.  Pt will report reduced waking at night due to pain Baseline:  Goal status: In progress 09/08/24  4.  Pt will report ability to tolerate standing x >30 mins Baseline:  Goal status: In progress 09/08/24    LONG TERM GOALS: Target date: 10/01/24  Pt to improve on ODI by 13% to demonstrate statistically significant Improvement in function. (MCID 13-15%) Baseline: 28/50=56% Goal status: INITIAL  2.  Pt will improve strength in all areas listed by at least 5 lbs to  demonstrate improved overall physical function Baseline:  Goal status: INITIAL  3.  Pt will report decrease in pain by at least 50% for improved toleration to activity/quality of life and to demonstrate improved management of pain. Baseline:  Goal status: INITIAL  4.  Pt will report return to [facility] and routine exercise regimen Baseline:  Goal status: INITIAL  5.  Pt will be indep with final HEP's (land and aquatic as appropriate) for continued management of condition Baseline:  Goal status: INITIAL    PLAN:  PT FREQUENCY: 2x/week  PT DURATION: 8 weeks  PLANNED INTERVENTIONS: 97164- PT Re-evaluation, 97750- Physical Performance Testing, 97110-Therapeutic exercises, 97530- Therapeutic activity, 97112- Neuromuscular re-education, 97535- Self Care, 02859- Manual therapy, [id number]- Gait training, [phone]- Aquatic Therapy, [phone]- Electrical stimulation (unattended), [phone]- Electrical stimulation (manual), 20560 (1-2 muscles), 20561 (3+ muscles)- Dry Needling, Patient/Family education, Balance training, Stair training, Taping, Joint mobilization, DME instructions, Cryotherapy, and Moist heat.  PLAN FOR NEXT SESSION: aquatic initially core and LE strengthening; stretching and positioning for pain relief/management,   Ronal Foots) Areebah Meinders MPT 09/08/2024 9:02 AM [facility] [address] [place], [place], [address] Phone: [phone]   Fax:  [phone]   "

## 2024-09-15 ENCOUNTER — Ambulatory Visit (HOSPITAL_BASED_OUTPATIENT_CLINIC_OR_DEPARTMENT_OTHER): Admitting: Physical Therapy

## 2024-09-15 NOTE — Therapy (Incomplete)
 " OUTPATIENT PHYSICAL THERAPY THORACOLUMBAR TREATMENT   Patient Name: Maria Bryan MRN: [id number] DOB:Mar 31, 1976, 49 y.o., female Today's Date: 09/15/2024  END OF SESSION:     Past Medical History:  Diagnosis Date   Hypertension    Obesity    Past Surgical History:  Procedure Laterality Date   ACHILLES TENDON REPAIR Left 2011   CESAREAN SECTION     LAPAROSCOPIC GASTRIC SLEEVE RESECTION N/A 06/15/2018   Procedure: LAPAROSCOPIC GASTRIC SLEEVE RESECTION, UPPER ENDO, ERAS PATHWAY;  Surgeon: Tanda Locus, MD;  Location: [facility];  Service: General;  Laterality: N/A;   TUBAL LIGATION     Patient Active Problem List   Diagnosis Date Noted   Chronic midline low back pain with sciatica 02/29/2024   Mixed hyperlipidemia 02/29/2024   Iron deficiency anemia secondary to inadequate dietary iron intake 02/29/2024   Right foot pain 02/23/2024   Vitamin D  deficiency 07/21/2023   Encounter for annual health examination 07/21/2023   H/O gastric sleeve 07/21/2023   COVID-19 vaccine administered 07/21/2023   Chronic pain of left knee 04/30/2023   Class 2 obesity due to excess calories with body mass index (BMI) of 38.0 to 38.9 in adult 04/30/2023   Other intervertebral disc degeneration, lumbar region 09/26/2021   OSA (obstructive sleep apnea) 06/15/2018   Chronic lumbosacral pain 06/15/2018   Essential hypertension 05/24/2018   Snoring 01/06/2018   Morbid obesity with BMI of 40.0-44.9, adult (HCC) 01/06/2018    PCP: Gaines Ada FNP  REFERRING PROVIDER: Ronal LITTIE Grave Pa-C  REFERRING DIAG: M54.16 (ICD-10-CM) - Radiculopathy, lumbar region   Rationale for Evaluation and Treatment: Rehabilitation  THERAPY DIAG:  No diagnosis found.  ONSET DATE: chronic  SUBJECTIVE:                                                                                                                                                                                           SUBJECTIVE  STATEMENT: Minor muscle soreness after last session.  Pain today 7/10 mostly in Rt knee.    Initial subjective  I have not been keeping up with the exercises. When it comes back it comes back with a vengeance. Have all sorts issues, knees OA, right foot plantar fascitis. I seem to hurt up in my shoulders. It seems to be all on right side. Have had steriod and gel injections in both knees.  Not much relief.  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 Yes: NPRS scale:  8/10 Rt knee, LB and hips 6/10;  Pain location: see above Pain description: ache, burn  Aggravating factors: standing causing burn x 10 minutes, first thing in am Relieving factors: sitting  PRECAUTIONS: None  RED FLAGS: None   WEIGHT BEARING RESTRICTIONS: No  FALLS:  Has patient fallen in last 6 months? No  LIVING ENVIRONMENT: Lives with: lives with their family Lives in: House/apartment Stairs: Yes: Internal: 16 steps; on right going up Has following equipment at home: None   OCCUPATION: attorney, sitting   PLOF: Independent   PATIENT GOALS increase strength, decrease pain, get back to exercise program  PATIENT GOALS: get back strengthening in all areas, reduce pain, get back to exercises  NEXT MD VISIT: as needed  OBJECTIVE:  Note: Objective measures were completed at Evaluation unless otherwise noted.  DIAGNOSTIC FINDINGS:  [place] includes significant lower lumbar degenerative changes with disc desiccation 5 mm AP diameter canal at L4-5 severe right moderate left lateral recess narrowing.  Moderate foraminal encroachment.  L5-S1 disc bulge right greater than left extraforaminal disc extension.  Central canal without stenosis.  Moderate left and moderate to severe right  foraminal encroachment.   PATIENT SURVEYS:  ODI: 28/50=56%    MUSCLE LENGTH: Hamstrings: wfl   POSTURE: increased lumbar lordosis, anterior pelvic tilt, right pelvic obliquity, and weight shift left  PALPATION: Mod TTP throughout lumbar paraspinals R>L and right sciatic notch glut area  LUMBAR ROM:   AROM eval  Flexion Full P!  Extension full  Right lateral flexion Full P!  Left lateral flexion Full P!  Right rotation   Left rotation    (Blank rows = not tested)  LOWER EXTREMITY ROM:    WFL  LOWER EXTREMITY MMT:    MMT Right eval Left eval  Hip flexion 45.5 56.3  Hip extension    Hip abduction 31.8 35.0  Hip adduction    Hip internal rotation    Hip external rotation    Knee flexion    Knee extension 32.0 41.1  Ankle dorsiflexion    Ankle plantarflexion    Ankle inversion    Ankle eversion     (Blank rows = not tested)  LUMBAR SPECIAL TESTS:  Slump test: Negative  FUNCTIONAL TESTS:  TUG: 14.38 4 stage balance Passed x4  GAIT: Distance walked: 500 ft Assistive device utilized: None Level of assistance: Complete Independence Comments: offloading left  TREATMENT   2023Reviewed current function, pt presentation, pain levels, HEP compliance, and response to prior Rx.   PT educated pt with contraction of and palpation of TrA.  Pt performed supine TrA contraction with 5 sec hold.   Pt performed: Supine marching with TrA  x 10 reps Supine bridge with TrA x 10 Supine PPT 2x10 Supine marching with TrA 2 x 10 reps Supine alt UE/LE with Tra x10 Supine alt LE extension with TrA x12 Supine bridge with TrA 2 x 10 Supine PPT x10 Supine clams with TrA 2x10 with RTB Supine SLR with TrA 2x5 bilat Supine manual HS stretch 3x30 sec bilat    [facility] Adult PT Treatment:                                                DATE: 09/08/24 Pt seen for aquatic therapy today.  Treatment took place in water  3.5-4.75 ft in depth at the  [facility] pool. Temp of  water  was 91.  Pt entered/exited the pool via stairs independently with bil hand rail.  *unsupported walking forward and backward multiple laps *suitcase carry using yellow HB bilateral then unilateral * forward/backward walking with kicks  - squatted rest period * R/L quad stretch with foot on 2nd step behind her, 30sec x 2 each *figure 4 stretch at steps (good stretch) * straddling noodle:  cycling; ue support corner wall: hip abdct/ add *decompression position with noodle wrapped posteriorly across chest: bilat Knees to chest x  5   Pt requires the buoyancy and hydrostatic pressure of water  for support, and to offload joints by  un weighting joint load by at least 50 % in navel deep water  and by at least 75-80% in chest to neck deep water .  Viscosity of the water  is needed for resistance of strengthening. Water  current perturbations provides challenge to standing balance requiring increased core activation.                                                                                                                          PATIENT EDUCATION:  Education details: reacquainting to aquatic therapy;  Person educated: Patient Education method: Explanation Education comprehension: verbalized understanding  HOME EXERCISE PROGRAM: Land exercise from previous episode:  [id number] (reissued 08/27/24)  ASSESSMENT:  CLINICAL IMPRESSION: Pt reports compliance with stretching at home. Instructed and demonstrated figure 4 stretch in sup for completion at home as well. Pt provided vc and demonstration for execution of exercises. Was able to reduce pain to 0/10 by end of session likely resetting pain cycle for overall reduction in pain sensitivity going forward. Goals ongoing     Initial Impression Patient is a 49 y.o. f who was seen today for physical therapy evaluation and treatment for LBP and right knee pain. Pt is well known to this clinic having completed an episode for similar dx ~ 2 yrs  ago.  Reports had been doing well until other issues surfaced with bilat knee pain, plantar fasciitis and then right sided sciatic nerve flare. Stopped going to [employer].  She  presents with pain limited deficits in right sided lumbosacral area into right hip with occasional burning into right post thigh.  Pain limits endurance, activity tolerance, gait, strength and functional mobility with ADL's. She reports lack of tolerance to exercise at gym.  She will benefit from skilled PT intervention in both aquatic and land settings. Plan to initiate in aquatic to reduce pain and guarding, improving movement patterns and ROM then will progress to land for optimal strengthening. Note: pt instructed on icing and stretching for plantar fasciitis and approp footware   OBJECTIVE IMPAIRMENTS: decreased activity tolerance, decreased endurance, decreased mobility, difficulty walking, decreased strength, and pain.   ACTIVITY LIMITATIONS: carrying, lifting, bending, sitting, standing, squatting, sleeping, stairs, transfers, and locomotion level  PARTICIPATION LIMITATIONS: cleaning, laundry, shopping, community activity, and yard work  PERSONAL FACTORS: 1-2 comorbidities: see PmHx are also affecting patient's  functional outcome.   REHAB POTENTIAL: Good  CLINICAL DECISION MAKING: Evolving/moderate complexity  EVALUATION COMPLEXITY: Low   GOALS: Goals reviewed with patient? Yes  SHORT TERM GOALS: Target date: 09/06/24  Pt will tolerate full aquatic sessions consistently without increase in pain and with improving function to demonstrate good toleration and effectiveness of intervention.  Baseline: Goal status:Met 09/08/24  2.  Pt will report return to [facility] to begin indep routine exerises Baseline: stopped Goal status: In process 09/08/24  3.  Pt will report reduced waking at night due to pain Baseline:  Goal status: In progress 09/08/24  4.  Pt will report ability to tolerate standing x >30 mins Baseline:   Goal status: In progress 09/08/24    LONG TERM GOALS: Target date: 10/01/24  Pt to improve on ODI by 13% to demonstrate statistically significant Improvement in function. (MCID 13-15%) Baseline: 28/50=56% Goal status: INITIAL  2.  Pt will improve strength in all areas listed by at least 5 lbs to demonstrate improved overall physical function Baseline:  Goal status: INITIAL  3.  Pt will report decrease in pain by at least 50% for improved toleration to activity/quality of life and to demonstrate improved management of pain. Baseline:  Goal status: INITIAL  4.  Pt will report return to [facility] and routine exercise regimen Baseline:  Goal status: INITIAL  5.  Pt will be indep with final HEP's (land and aquatic as appropriate) for continued management of condition Baseline:  Goal status: INITIAL    PLAN:  PT FREQUENCY: 2x/week  PT DURATION: 8 weeks  PLANNED INTERVENTIONS: 97164- PT Re-evaluation, 97750- Physical Performance Testing, 97110-Therapeutic exercises, 97530- Therapeutic activity, 97112- Neuromuscular re-education, 97535- Self Care, 02859- Manual therapy, [id number]- Gait training, [phone]- Aquatic Therapy, [phone]- Electrical stimulation (unattended), [phone]- Electrical stimulation (manual), 20560 (1-2 muscles), 20561 (3+ muscles)- Dry Needling, Patient/Family education, Balance training, Stair training, Taping, Joint mobilization, DME instructions, Cryotherapy, and Moist heat.  PLAN FOR NEXT SESSION: aquatic initially core and LE strengthening; stretching and positioning for pain relief/management,   Ronal Foots) Ziemba MPT 09/15/24 6:54 AM [facility] [address] [place], [place], [address] Phone: [phone]   Fax:  [phone]   "

## 2024-09-17 ENCOUNTER — Ambulatory Visit (HOSPITAL_BASED_OUTPATIENT_CLINIC_OR_DEPARTMENT_OTHER): Admitting: Physical Therapy

## 2024-09-17 ENCOUNTER — Encounter (HOSPITAL_BASED_OUTPATIENT_CLINIC_OR_DEPARTMENT_OTHER): Payer: Self-pay | Admitting: Physical Therapy

## 2024-09-17 DIAGNOSIS — M6281 Muscle weakness (generalized): Secondary | ICD-10-CM

## 2024-09-17 DIAGNOSIS — G8929 Other chronic pain: Secondary | ICD-10-CM

## 2024-09-17 DIAGNOSIS — M5459 Other low back pain: Secondary | ICD-10-CM | POA: Diagnosis not present

## 2024-09-17 NOTE — Therapy (Signed)
 " OUTPATIENT PHYSICAL THERAPY THORACOLUMBAR TREATMENT   Patient Name: Maria Bryan MRN: [id number] DOB:1975-10-15, 49 y.o., female Today's Date: 09/17/2024  END OF SESSION:  PT End of Session - 09/17/24 0815     Visit Number 6    Date for Recertification  10/01/24    Authorization Type [employer] [facility]    PT Start Time 0803    PT Stop Time 0841    PT Time Calculation (min) 38 min    Behavior During Therapy [facility] for tasks assessed/performed           Past Medical History:  Diagnosis Date   Hypertension    Obesity    Past Surgical History:  Procedure Laterality Date   ACHILLES TENDON REPAIR Left 2011   CESAREAN SECTION     LAPAROSCOPIC GASTRIC SLEEVE RESECTION N/A 06/15/2018   Procedure: LAPAROSCOPIC GASTRIC SLEEVE RESECTION, UPPER ENDO, ERAS PATHWAY;  Surgeon: Tanda Locus, MD;  Location: [facility];  Service: General;  Laterality: N/A;   TUBAL LIGATION     Patient Active Problem List   Diagnosis Date Noted   Chronic midline low back pain with sciatica 02/29/2024   Mixed hyperlipidemia 02/29/2024   Iron deficiency anemia secondary to inadequate dietary iron intake 02/29/2024   Right foot pain 02/23/2024   Vitamin D  deficiency 07/21/2023   Encounter for annual health examination 07/21/2023   H/O gastric sleeve 07/21/2023   COVID-19 vaccine administered 07/21/2023   Chronic pain of left knee 04/30/2023   Class 2 obesity due to excess calories with body mass index (BMI) of 38.0 to 38.9 in adult 04/30/2023   Other intervertebral disc degeneration, lumbar region 09/26/2021   OSA (obstructive sleep apnea) 06/15/2018   Chronic lumbosacral pain 06/15/2018   Essential hypertension 05/24/2018   Snoring 01/06/2018   Morbid obesity with BMI of 40.0-44.9, adult (HCC) 01/06/2018    PCP: Gaines Ada FNP  REFERRING PROVIDER: Ronal LITTIE Grave Pa-C  REFERRING DIAG: M54.16 (ICD-10-CM) - Radiculopathy, lumbar region   Rationale for Evaluation and Treatment:  Rehabilitation  THERAPY DIAG:  Other low back pain  Chronic pain of right knee  Muscle weakness (generalized)  ONSET DATE: chronic  SUBJECTIVE:                                                                                                                                                                                           SUBJECTIVE STATEMENT: Pt reports that she missed appt due to having to care for mom after she fell.  Pt reports she has been stretch quads and this has helped.  She is still waking in night depending on sleep position.  Initial subjective  I have not been keeping up with the exercises. When it comes back it comes back with a vengeance. Have all sorts issues, knees OA, right foot plantar fascitis. I seem to hurt up in my shoulders. It seems to be all on right side. Have had steriod and gel injections in both knees.  Not much relief.  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 Yes: NPRS scale: 4/10 bil knees;  Pain location: see above Pain description: ache, burn  Aggravating factors: standing causing burn x 10 minutes, first thing in am Relieving factors: sitting  PRECAUTIONS: None  RED FLAGS: None   WEIGHT BEARING RESTRICTIONS: No  FALLS:  Has patient fallen in last 6 months? No  LIVING ENVIRONMENT: Lives with: lives with their family Lives in: House/apartment Stairs: Yes: Internal: 16 steps; on right going up Has following equipment at home: None   OCCUPATION: attorney, sitting   PLOF: Independent   PATIENT GOALS increase strength, decrease pain, get back to exercise program  PATIENT GOALS: get back strengthening in all areas, reduce pain, get back to exercises  NEXT MD VISIT: as needed  OBJECTIVE:  Note: Objective measures were completed at Evaluation unless otherwise noted.  DIAGNOSTIC FINDINGS:  Previous MRI scan 09[place]18/[place]  includes significant lower lumbar degenerative changes with disc desiccation 5 mm AP diameter canal at L4-5 severe right moderate left lateral recess narrowing.  Moderate foraminal encroachment.  L5-S1 disc bulge right greater than left extraforaminal disc extension.  Central canal without stenosis.  Moderate left and moderate to severe right foraminal encroachment.   PATIENT SURVEYS:  ODI: 28/50=56%    MUSCLE LENGTH: Hamstrings: wfl   POSTURE: increased lumbar lordosis, anterior pelvic tilt, right pelvic obliquity, and weight shift left  PALPATION: Mod TTP throughout lumbar paraspinals R>L and right sciatic notch glut area  LUMBAR ROM:   AROM eval  Flexion Full P!  Extension full  Right lateral flexion Full P!  Left lateral flexion Full P!  Right rotation   Left rotation    (Blank rows = not tested)  LOWER EXTREMITY ROM:    WFL  LOWER EXTREMITY MMT:    MMT Right eval Left eval  Hip flexion 45.5 56.3  Hip extension    Hip abduction 31.8 35.0  Hip adduction    Hip internal rotation    Hip external rotation    Knee flexion    Knee extension 32.0 41.1  Ankle dorsiflexion    Ankle plantarflexion    Ankle inversion    Ankle eversion     (Blank rows = not tested)  LUMBAR SPECIAL TESTS:  Slump test: Negative  FUNCTIONAL TESTS:  TUG: 14.38 4 stage balance Passed x4  GAIT: Distance walked: 500 ft Assistive device utilized: None Level of assistance: Complete Independence Comments: offloading left  TREATMENT  [facility] Adult PT Treatment:                                                DATE: 09/17/24  * prior to entry in water : Pt instructed in seated hip flexor/quad stretch on RLE. Pt returned demo with cues x 30 sec (verbally added to HEP)  Pt seen for aquatic therapy today.  Treatment took place in water  3.5-4.75 ft in depth at the  [facility] pool. Temp of water  was 91.  Pt entered/exited the pool via stairs independently with bil hand  rail.  *unsupported walking forward and backward multiple laps * side stepping with arm add/abdct with rainbow hand floats * UE On wall:  LE swings into hip flexion/ extension x ; hip abdct/ add x 10 each * forward walking kicks * squat at wall-> away from wall-> with rainbow hand float x 12 * TrA set with single rainbow hand float pull down-> bil to thighs x 10; repeated in staggered stance -> pull down to same side knee taps -> with marching * bil gastroc stretch with heels off of step x 30sec    Pt requires the buoyancy and hydrostatic pressure of water  for support, and to offload joints by  un weighting joint load by at least 50 % in navel deep water  and by at least 75-80% in chest to neck deep water .  Viscosity of the water  is needed for resistance of strengthening. Water  current perturbations provides challenge to standing balance requiring increased core activation.                                                                                                                          PATIENT EDUCATION:  Education details: reacquainting to aquatic therapy;  Person educated: Patient Education method: Explanation Education comprehension: verbalized understanding  HOME EXERCISE PROGRAM: Land exercise from previous episode:  [id number] (reissued 08/27/24)  ASSESSMENT:  CLINICAL IMPRESSION: Pt tolerated session well with gradual reduction of pain. Pt requires minor ques for even weight between LE and core engagement. Began discussing transition to independent aquatic HEP at local pool (has membership to [employer] in [place]). Pt is progressing gradually towards goals.      Initial Impression Patient is a 49 y.o. f who was seen today for physical therapy evaluation and treatment for LBP and right knee pain. Pt is well known to this clinic having completed an episode for similar dx ~ 2 yrs ago.  Reports had been doing well until other issues surfaced with bilat knee pain, plantar  fasciitis and then right sided sciatic nerve flare. Stopped going to [employer].  She  presents with pain limited deficits in right sided lumbosacral area into right hip with occasional burning into right post thigh.  Pain limits endurance, activity tolerance, gait, strength and functional mobility with ADL's. She reports lack of tolerance to exercise at gym.  She will benefit from skilled PT intervention in both aquatic and land settings. Plan to initiate in aquatic to reduce pain and guarding, improving movement patterns and ROM then will progress to land for optimal strengthening. Note: pt instructed on icing and stretching for plantar fasciitis and approp footware   OBJECTIVE IMPAIRMENTS: decreased activity tolerance, decreased endurance, decreased mobility, difficulty walking, decreased strength, and pain.   ACTIVITY LIMITATIONS: carrying, lifting, bending, sitting, standing, squatting, sleeping, stairs, transfers, and locomotion level  PARTICIPATION LIMITATIONS: cleaning, laundry, shopping, community activity, and yard work  PERSONAL FACTORS: 1-2 comorbidities: see PmHx are also affecting patient's functional outcome.   REHAB POTENTIAL: Good  CLINICAL DECISION MAKING: Evolving/moderate complexity  EVALUATION COMPLEXITY: Low   GOALS: Goals reviewed with patient? Yes  SHORT TERM GOALS: Target date: 09/06/24  Pt will tolerate full aquatic sessions consistently without increase in pain and with improving function to demonstrate good toleration and effectiveness of intervention.  Baseline: Goal status:Met 09/08/24  2.  Pt will report return to [facility] to begin indep routine exerises Baseline: stopped Goal status: In process 09/08/24  3.  Pt will report reduced waking at night due to pain Baseline:  Goal status: In progress 09/08/24  4.  Pt will report ability to tolerate standing x >30 mins Baseline:  Goal status: In progress 09/08/24    LONG TERM GOALS: Target date: 10/01/24  Pt to  improve on ODI by 13% to demonstrate statistically significant Improvement in function. (MCID 13-15%) Baseline: 28/50=56% Goal status: INITIAL  2.  Pt will improve strength in all areas listed by at least 5 lbs to demonstrate improved overall physical function Baseline:  Goal status: INITIAL  3.  Pt will report decrease in pain by at least 50% for improved toleration to activity/quality of life and to demonstrate improved management of pain. Baseline:  Goal status: INITIAL  4.  Pt will report return to [facility] and routine exercise regimen Baseline:  Goal status: INITIAL  5.  Pt will be indep with final HEP's (land and aquatic as appropriate) for continued management of condition Baseline:  Goal status: INITIAL    PLAN:  PT FREQUENCY: 2x/week  PT DURATION: 8 weeks  PLANNED INTERVENTIONS: 97164- PT Re-evaluation, 97750- Physical Performance Testing, 97110-Therapeutic exercises, 97530- Therapeutic activity, 97112- Neuromuscular re-education, 97535- Self Care, 02859- Manual therapy, [id number]- Gait training, [phone]- Aquatic Therapy, [phone]- Electrical stimulation (unattended), [phone]- Electrical stimulation (manual), 20560 (1-2 muscles), 20561 (3+ muscles)- Dry Needling, Patient/Family education, Balance training, Stair training, Taping, Joint mobilization, DME instructions, Cryotherapy, and Moist heat.  PLAN FOR NEXT SESSION: aquatic initially core and LE strengthening; stretching and positioning for pain relief/management,   Delon Aquas, PTA 09/17/24 8:51 AM [facility] [address] [place], [place], [address] Phone: [phone]   Fax:  [phone]    "

## 2024-09-22 ENCOUNTER — Ambulatory Visit (HOSPITAL_BASED_OUTPATIENT_CLINIC_OR_DEPARTMENT_OTHER)

## 2024-09-22 ENCOUNTER — Encounter (HOSPITAL_BASED_OUTPATIENT_CLINIC_OR_DEPARTMENT_OTHER): Payer: Self-pay

## 2024-09-22 DIAGNOSIS — G8929 Other chronic pain: Secondary | ICD-10-CM

## 2024-09-22 DIAGNOSIS — M5459 Other low back pain: Secondary | ICD-10-CM | POA: Diagnosis not present

## 2024-09-22 DIAGNOSIS — R293 Abnormal posture: Secondary | ICD-10-CM

## 2024-09-22 DIAGNOSIS — M6281 Muscle weakness (generalized): Secondary | ICD-10-CM

## 2024-09-22 NOTE — Therapy (Signed)
 " OUTPATIENT PHYSICAL THERAPY THORACOLUMBAR TREATMENT   Patient Name: Maria Bryan MRN: [id number] DOB:[birth year], 49 y.o., female Today's Date: 09/22/2024  END OF SESSION:  PT End of Session - 09/22/24 0832     Visit Number 7    Date for Recertification  10/01/24    Authorization Type [employer] [place]    PT Start Time 0809    PT Stop Time 0850    PT Time Calculation (min) 41 min    Activity Tolerance Patient tolerated treatment well    Behavior During Therapy [facility] for tasks assessed/performed            Past Medical History:  Diagnosis Date   Hypertension    Obesity    Past Surgical History:  Procedure Laterality Date   ACHILLES TENDON REPAIR Left 2011   CESAREAN SECTION     LAPAROSCOPIC GASTRIC SLEEVE RESECTION N/A 06/15/2018   Procedure: LAPAROSCOPIC GASTRIC SLEEVE RESECTION, UPPER ENDO, ERAS PATHWAY;  Surgeon: Tanda Locus, MD;  Location: [facility];  Service: General;  Laterality: N/A;   TUBAL LIGATION     Patient Active Problem List   Diagnosis Date Noted   Chronic midline low back pain with sciatica 02/29/2024   Mixed hyperlipidemia 02/29/2024   Iron deficiency anemia secondary to inadequate dietary iron intake 02/29/2024   Right foot pain 02/23/2024   Vitamin D  deficiency 07/21/2023   Encounter for annual health examination 07/21/2023   H/O gastric sleeve 07/21/2023   COVID-19 vaccine administered 07/21/2023   Chronic pain of left knee 04/30/2023   Class 2 obesity due to excess calories with body mass index (BMI) of 38.0 to 38.9 in adult 04/30/2023   Other intervertebral disc degeneration, lumbar region 09/26/2021   OSA (obstructive sleep apnea) 06/15/2018   Chronic lumbosacral pain 06/15/2018   Essential hypertension 05/24/2018   Snoring 01/06/2018   Morbid obesity with BMI of 40.0-44.9, adult (HCC) 01/06/2018    PCP: Gaines Ada FNP  REFERRING PROVIDER: Ronal LITTIE Grave Pa-C  REFERRING DIAG: M54.16 (ICD-10-CM) - Radiculopathy, lumbar region    Rationale for Evaluation and Treatment: Rehabilitation  THERAPY DIAG:  Other low back pain  Muscle weakness (generalized)  Chronic pain of right knee  Abnormal posture  ONSET DATE: chronic  SUBJECTIVE:                                                                                                                                                                                           SUBJECTIVE STATEMENT: Pt reports she has been imrpoving with PT. The stretches have really helped.    Initial subjective  I have not been keeping up with  the exercises. When it comes back it comes back with a vengeance. Have all sorts issues, knees OA, right foot plantar fascitis. I seem to hurt up in my shoulders. It seems to be all on right side. Have had steriod and gel injections in both knees.  Not much relief.  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 Yes: NPRS scale: 4/10 bil knees;  Pain location: see above Pain description: ache, burn  Aggravating factors: standing causing burn x 10 minutes, first thing in am Relieving factors: sitting  PRECAUTIONS: None  RED FLAGS: None   WEIGHT BEARING RESTRICTIONS: No  FALLS:  Has patient fallen in last 6 months? No  LIVING ENVIRONMENT: Lives with: lives with their family Lives in: House/apartment Stairs: Yes: Internal: 16 steps; on right going up Has following equipment at home: None   OCCUPATION: attorney, sitting   PLOF: Independent   PATIENT GOALS increase strength, decrease pain, get back to exercise program  PATIENT GOALS: get back strengthening in all areas, reduce pain, get back to exercises  NEXT MD VISIT: as needed  OBJECTIVE:  Note: Objective measures were completed at Evaluation unless otherwise noted.  DIAGNOSTIC FINDINGS:  Previous MRI [place]18[place] includes significant lower lumbar degenerative changes with  disc desiccation 5 mm AP diameter canal at L4-5 severe right moderate left lateral recess narrowing.  Moderate foraminal encroachment.  L5-S1 disc bulge right greater than left extraforaminal disc extension.  Central canal without stenosis.  Moderate left and moderate to severe right foraminal encroachment.   PATIENT SURVEYS:  ODI: 28/50=56%    MUSCLE LENGTH: Hamstrings: wfl   POSTURE: increased lumbar lordosis, anterior pelvic tilt, right pelvic obliquity, and weight shift left  PALPATION: Mod TTP throughout lumbar paraspinals R>L and right sciatic notch glut area  LUMBAR ROM:   AROM eval  Flexion Full P!  Extension full  Right lateral flexion Full P!  Left lateral flexion Full P!  Right rotation   Left rotation    (Blank rows = not tested)  LOWER EXTREMITY ROM:    WFL  LOWER EXTREMITY MMT:    MMT Right eval Left eval  Hip flexion 45.5 56.3  Hip extension    Hip abduction 31.8 35.0  Hip adduction    Hip internal rotation    Hip external rotation    Knee flexion    Knee extension 32.0 41.1  Ankle dorsiflexion    Ankle plantarflexion    Ankle inversion    Ankle eversion     (Blank rows = not tested)  LUMBAR SPECIAL TESTS:  Slump test: Negative  FUNCTIONAL TESTS:  TUG: 14.38 4 stage balance Passed x4  GAIT: Distance walked: 500 ft Assistive device utilized: None Level of assistance: Complete Independence Comments: offloading left  TREATMENT   [facility] Adult PT Treatment:                                                DATE: 09/22/24  -Seated HSS 3x20sec bil -Seated figure 4 stretching bil (with cervical stretching to decrease neck mm tension with this) -modified downward dog stretch at rail 2x20 sec -Supine adductor squeeze with 5 hold 2x10 Supine march with TrA 2x10 -Hooklying clam GTB 2x10 -LTR 5 x10 -HEP update and review -gait in hall between exercises  [facility] Adult PT Treatment:                                                DATE:  09/17/24  * prior to entry in water : Pt instructed in seated hip flexor/quad stretch on RLE. Pt returned demo with cues x 30 sec (verbally added to HEP)  Pt seen for aquatic therapy today.  Treatment took place in water  3.5-4.75 ft in depth at the [facility] pool. Temp of water  was 91.  Pt entered/exited the pool via stairs independently with bil hand rail.  *unsupported walking forward and backward multiple laps * side stepping with arm add/abdct with rainbow hand floats * UE On wall:  LE swings into hip flexion/ extension x ; hip abdct/ add x 10 each * forward walking kicks * squat at wall-> away from wall-> with rainbow hand float x 12 * TrA set with single rainbow hand float pull down-> bil to thighs x 10; repeated in staggered stance -> pull down to same side knee taps -> with marching * bil gastroc stretch with heels off of step x 30sec    Pt requires the buoyancy and hydrostatic pressure of water  for support, and to offload joints by  un weighting joint load by at least 50 % in navel deep water  and by at least 75-80% in chest to neck deep water .  Viscosity of the water  is needed for resistance of strengthening. Water  current perturbations provides challenge to standing balance requiring increased core activation.                                                                                                                          PATIENT EDUCATION:  Education details: reacquainting to aquatic therapy;  Person educated: Patient Education method: Explanation Education comprehension: verbalized understanding  HOME EXERCISE PROGRAM: Land exercise from previous episode:  [id number] (reissued 08/27/24)  ASSESSMENT:  CLINICAL IMPRESSION: Pt had good tolerance for first land visit. She reported onset of neck tightness with seated stretching, so educated in ways to decrease this. Discussed HEP frequency and modifications. She responded well to NMR focused exercises and felt  good activation of TrA and glute mm. Required cuing with exercises for proper technique and activation with exercises.      Initial Impression Patient is a 49 y.o. f who was seen today for physical therapy evaluation and treatment for LBP and right knee pain. Pt is well known to this clinic having completed an episode for similar dx ~ 2 yrs ago.  Reports had been doing well until other issues surfaced with bilat knee pain, plantar fasciitis and then right sided sciatic nerve flare. Stopped going to [employer].  She  presents with pain limited deficits in right sided lumbosacral area into right hip with occasional burning into right post thigh.  Pain limits endurance,  activity tolerance, gait, strength and functional mobility with ADL's. She reports lack of tolerance to exercise at gym.  She will benefit from skilled PT intervention in both aquatic and land settings. Plan to initiate in aquatic to reduce pain and guarding, improving movement patterns and ROM then will progress to land for optimal strengthening. Note: pt instructed on icing and stretching for plantar fasciitis and approp footware   OBJECTIVE IMPAIRMENTS: decreased activity tolerance, decreased endurance, decreased mobility, difficulty walking, decreased strength, and pain.   ACTIVITY LIMITATIONS: carrying, lifting, bending, sitting, standing, squatting, sleeping, stairs, transfers, and locomotion level  PARTICIPATION LIMITATIONS: cleaning, laundry, shopping, community activity, and yard work  PERSONAL FACTORS: 1-2 comorbidities: see PmHx are also affecting patient's functional outcome.   REHAB POTENTIAL: Good  CLINICAL DECISION MAKING: Evolving/moderate complexity  EVALUATION COMPLEXITY: Low   GOALS: Goals reviewed with patient? Yes  SHORT TERM GOALS: Target date: 09/06/24  Pt will tolerate full aquatic sessions consistently without increase in pain and with improving function to demonstrate good toleration and effectiveness of  intervention.  Baseline: Goal status:Met 09/08/24  2.  Pt will report return to [facility] to begin indep routine exerises Baseline: stopped Goal status: In process 09/08/24  3.  Pt will report reduced waking at night due to pain Baseline:  Goal status: In progress 09/08/24  4.  Pt will report ability to tolerate standing x >30 mins Baseline:  Goal status: In progress 09/08/24    LONG TERM GOALS: Target date: 10/01/24  Pt to improve on ODI by 13% to demonstrate statistically significant Improvement in function. (MCID 13-15%) Baseline: 28/50=56% Goal status: INITIAL  2.  Pt will improve strength in all areas listed by at least 5 lbs to demonstrate improved overall physical function Baseline:  Goal status: INITIAL  3.  Pt will report decrease in pain by at least 50% for improved toleration to activity/quality of life and to demonstrate improved management of pain. Baseline:  Goal status: IN PROGRESS (40-50%) 09/22/24  4.  Pt will report return to [facility] and routine exercise regimen Baseline:  Goal status: INITIAL  5.  Pt will be indep with final HEP's (land and aquatic as appropriate) for continued management of condition Baseline:  Goal status: INITIAL    PLAN:  PT FREQUENCY: 2x/week  PT DURATION: 8 weeks  PLANNED INTERVENTIONS: 97164- PT Re-evaluation, 97750- Physical Performance Testing, 97110-Therapeutic exercises, 97530- Therapeutic activity, 97112- Neuromuscular re-education, 97535- Self Care, 02859- Manual therapy, [id number]- Gait training, [phone]- Aquatic Therapy, [phone]- Electrical stimulation (unattended), [phone]- Electrical stimulation (manual), 20560 (1-2 muscles), 20561 (3+ muscles)- Dry Needling, Patient/Family education, Balance training, Stair training, Taping, Joint mobilization, DME instructions, Cryotherapy, and Moist heat.  PLAN FOR NEXT SESSION: aquatic initially core and LE strengthening; stretching and positioning for pain relief/management,   Asberry Rodes,  PTA  09/22/24 9:16 AM [facility] [address] [place], [place], [address] Phone: [phone]   Fax:  [phone]    "

## 2024-09-24 ENCOUNTER — Encounter (HOSPITAL_BASED_OUTPATIENT_CLINIC_OR_DEPARTMENT_OTHER): Payer: Self-pay

## 2024-09-24 ENCOUNTER — Ambulatory Visit (HOSPITAL_BASED_OUTPATIENT_CLINIC_OR_DEPARTMENT_OTHER): Admitting: Physical Therapy

## 2024-09-24 ENCOUNTER — Encounter (HOSPITAL_BASED_OUTPATIENT_CLINIC_OR_DEPARTMENT_OTHER): Payer: Self-pay | Admitting: Physical Therapy

## 2024-09-24 DIAGNOSIS — G8929 Other chronic pain: Secondary | ICD-10-CM

## 2024-09-24 DIAGNOSIS — M5459 Other low back pain: Secondary | ICD-10-CM

## 2024-09-24 DIAGNOSIS — M6281 Muscle weakness (generalized): Secondary | ICD-10-CM

## 2024-09-29 ENCOUNTER — Ambulatory Visit (HOSPITAL_BASED_OUTPATIENT_CLINIC_OR_DEPARTMENT_OTHER)

## 2024-10-01 ENCOUNTER — Ambulatory Visit (HOSPITAL_BASED_OUTPATIENT_CLINIC_OR_DEPARTMENT_OTHER): Admitting: Physical Therapy

## 2024-10-01 ENCOUNTER — Encounter (HOSPITAL_BASED_OUTPATIENT_CLINIC_OR_DEPARTMENT_OTHER): Payer: Self-pay | Admitting: Physical Therapy

## 2024-10-01 DIAGNOSIS — M6281 Muscle weakness (generalized): Secondary | ICD-10-CM

## 2024-10-01 DIAGNOSIS — G8929 Other chronic pain: Secondary | ICD-10-CM

## 2024-10-01 DIAGNOSIS — M5459 Other low back pain: Secondary | ICD-10-CM

## 2024-10-01 NOTE — Therapy (Signed)
 " OUTPATIENT PHYSICAL THERAPY THORACOLUMBAR TREATMENT Progress Note / re-cert Reporting Period 08/06/24 to 10/01/24  See note below for Objective Data and Assessment of Progress/Goals.      Patient Name: Maria Bryan MRN: [id number] DOB:[birth year], 49 y.o., female Today's Date: 10/01/2024  END OF SESSION:  PT End of Session - 10/01/24 0815     Visit Number 8    Date for Recertification  10/29/24    Authorization Type [employer] [place]    PT Start Time 0802    PT Stop Time 0845    PT Time Calculation (min) 43 min    Activity Tolerance Patient tolerated treatment well    Behavior During Therapy [facility] for tasks assessed/performed             Past Medical History:  Diagnosis Date   Hypertension    Obesity    Past Surgical History:  Procedure Laterality Date   ACHILLES TENDON REPAIR Left 2011   CESAREAN SECTION     LAPAROSCOPIC GASTRIC SLEEVE RESECTION N/A 06/15/2018   Procedure: LAPAROSCOPIC GASTRIC SLEEVE RESECTION, UPPER ENDO, ERAS PATHWAY;  Surgeon: Tanda Locus, MD;  Location: [facility];  Service: General;  Laterality: N/A;   TUBAL LIGATION     Patient Active Problem List   Diagnosis Date Noted   Chronic midline low back pain with sciatica 02/29/2024   Mixed hyperlipidemia 02/29/2024   Iron deficiency anemia secondary to inadequate dietary iron intake 02/29/2024   Right foot pain 02/23/2024   Vitamin D  deficiency 07/21/2023   Encounter for annual health examination 07/21/2023   H/O gastric sleeve 07/21/2023   COVID-19 vaccine administered 07/21/2023   Chronic pain of left knee 04/30/2023   Class 2 obesity due to excess calories with body mass index (BMI) of 38.0 to 38.9 in adult 04/30/2023   Other intervertebral disc degeneration, lumbar region 09/26/2021   OSA (obstructive sleep apnea) 06/15/2018   Chronic lumbosacral pain 06/15/2018   Essential hypertension 05/24/2018   Snoring 01/06/2018   Morbid obesity with BMI of 40.0-44.9, adult (HCC) 01/06/2018     PCP: Gaines Ada FNP  REFERRING PROVIDER: Ronal LITTIE Grave Pa-[facility]  REFERRING DIAG: M54.16 (ICD-10-CM) - Radiculopathy, lumbar region   Rationale for Evaluation and Treatment: Rehabilitation  THERAPY DIAG:  Other low back pain  Muscle weakness (generalized)  Chronic pain of right knee  ONSET DATE: chronic  SUBJECTIVE:                                                                                                                                                                                           SUBJECTIVE STATEMENT: I can tell I am  so much better since I started therapy. Knees, back and shoulders/neck.  Need to motivate myself to come/exercise but feel better when I do it    Initial subjective  I have not been keeping up with the exercises. When it comes back it comes back with a vengeance. Have all sorts issues, knees OA, right foot plantar fascitis. I seem to hurt up in my shoulders. It seems to be all on right side. Have had steriod and gel injections in both knees.  Not much relief.  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 Yes: NPRS scale: 4/10 bil knees;  Pain location: see above Pain description: ache, burn  Aggravating factors: standing causing burn x 10 minutes, first thing in am Relieving factors: sitting  PRECAUTIONS: None  RED FLAGS: None   WEIGHT BEARING RESTRICTIONS: No  FALLS:  Has patient fallen in last 6 months? No  LIVING ENVIRONMENT: Lives with: lives with their family Lives in: House/apartment Stairs: Yes: Internal: 16 steps; on right going up Has following equipment at home: None   OCCUPATION: attorney, sitting   PLOF: Independent   PATIENT GOALS increase strength, decrease pain, get back to exercise program  PATIENT GOALS: get back strengthening in all areas, reduce pain, get back to exercises  NEXT MD VISIT: as needed  OBJECTIVE:  Note:  Objective measures were completed at Evaluation unless otherwise noted.  DIAGNOSTIC FINDINGS:  Previous MRI scan [place]18/[place] includes significant lower lumbar degenerative changes with disc desiccation 5 mm AP diameter canal at L4-5 severe right moderate left lateral recess narrowing.  Moderate foraminal encroachment.  L5-S1 disc bulge right greater than left extraforaminal disc extension.  Central canal without stenosis.  Moderate left and moderate to severe right foraminal encroachment.   PATIENT SURVEYS:  ODI: 28/50=56%    MUSCLE LENGTH: Hamstrings: wfl   POSTURE: increased lumbar lordosis, anterior pelvic tilt, right pelvic obliquity, and weight shift left  PALPATION: Mod TTP throughout lumbar paraspinals R>L and right sciatic notch glut area  LUMBAR ROM:   AROM eval 10/01/24  Flexion Full P! Full no pain  Extension full   Right lateral flexion Full P!   Left lateral flexion Full P!   Right rotation    Left rotation     (Blank rows = not tested)  LOWER EXTREMITY ROM:    WFL  LOWER EXTREMITY MMT:    HD tested in sitting with feet on floor Right eval Left eval R / L 10/01/24  Hip flexion 45.5 56.3 45.3 /  57.2  Hip extension     Hip abduction 31.8 35.0 32.3 / 42.1  Hip adduction     Hip internal rotation     Hip external rotation     Knee flexion     Knee extension 32.0 41.1 47.1 / 59.1  Ankle dorsiflexion     Ankle plantarflexion     Ankle inversion     Ankle eversion      (Blank rows = not tested)  LUMBAR SPECIAL TESTS:  Slump test: Negative  FUNCTIONAL TESTS:  TUG: 14.38 4 stage balance Passed x4  10/02/23: TUG 10.54  GAIT: Distance walked: 500 ft Assistive device utilized: None Level of assistance: Complete Independence Comments: offloading left  TREATMENT   10/01/24 Pt seen for aquatic therapy today.  Treatment took place in water  3.5-4.75 ft in depth at the [facility] pool. Temp of water  was 91.  Pt entered/exited the  pool via stairs independently with bil hand rail.  *unsupported walking forward and backward multiple laps *forward and backward march with kick * TrA set with black noodle pull down-> bil to thighs x 10; repeated in staggered stance x 10 *hip hinging using yellow HB x 10.  VC for execution *open book stretch x 5 R/L * side stepping with arm add/abdct with yellow hand floats  PN testing  Pt requires the buoyancy and hydrostatic pressure of water  for support, and to offload joints by  un weighting joint load by at least 50 % in navel deep water  and by at least 75-80% in chest to neck deep water .  Viscosity of the water  is needed for resistance of strengthening. Water  current perturbations provides challenge to standing balance requiring increased core activation.   [facility] Adult PT Treatment:                                                DATE: 09/22/24  -Seated HSS 3x20sec bil -Seated figure 4 stretching bil (with cervical stretching to decrease neck mm tension with this) -modified downward dog stretch at rail 2x20 sec -Supine adductor squeeze with 5 hold 2x10 Supine march with TrA 2x10 -Hooklying clam GTB 2x10 -LTR 5 x10 -HEP update and review -gait in hall between exercises                                                                                                                             PATIENT EDUCATION:  Education details: reacquainting to aquatic therapy;  Person educated: Patient Education method: Explanation Education comprehension: verbalized understanding  HOME EXERCISE PROGRAM: Land exercise from previous episode:  [id number] (reissued 08/27/24)  ASSESSMENT:  CLINICAL IMPRESSION: PN: pt reports overall pain reduced throughout all areas. Functional testing demonstrates an improvement in knee strength TUG score indicating and improvement in lower extremity function, mobility and decreased fall risk. She has returned to full lumbar ROM without pain. She has not  ye returned to the [facility] although she does have an active membership.  She reports she continues to wake frequently at night due to lb discomfort and is limited to 30 mins of walking tolerance before needing to rest.  She has reached her max potential in aquatic setting and is ready for full transition onto land. Plan to see pt 1w 4 to progress toward reaching remaining goals *  She VU that if she late cancels or NS for 1 more session she will be Dc'ed as per our policy.    Initial Impression Patient is a 49 y.o. f who was seen today for physical therapy evaluation and treatment for LBP and right knee pain. Pt is well known to this clinic having completed an episode for similar dx ~ 2 yrs ago.  Reports had been doing well  until other issues surfaced with bilat knee pain, plantar fasciitis and then right sided sciatic nerve flare. Stopped going to [employer].  She  presents with pain limited deficits in right sided lumbosacral area into right hip with occasional burning into right post thigh.  Pain limits endurance, activity tolerance, gait, strength and functional mobility with ADL's. She reports lack of tolerance to exercise at gym.  She will benefit from skilled PT intervention in both aquatic and land settings. Plan to initiate in aquatic to reduce pain and guarding, improving movement patterns and ROM then will progress to land for optimal strengthening. Note: pt instructed on icing and stretching for plantar fasciitis and approp footware   OBJECTIVE IMPAIRMENTS: decreased activity tolerance, decreased endurance, decreased mobility, difficulty walking, decreased strength, and pain.   ACTIVITY LIMITATIONS: carrying, lifting, bending, sitting, standing, squatting, sleeping, stairs, transfers, and locomotion level  PARTICIPATION LIMITATIONS: cleaning, laundry, shopping, community activity, and yard work  PERSONAL FACTORS: 1-2 comorbidities: see PmHx are also affecting patient's functional outcome.    REHAB POTENTIAL: Good  CLINICAL DECISION MAKING: Evolving/moderate complexity  EVALUATION COMPLEXITY: Low   GOALS: Goals reviewed with patient? Yes  SHORT TERM GOALS: Target date: 09/06/24  Pt will tolerate full aquatic sessions consistently without increase in pain and with improving function to demonstrate good toleration and effectiveness of intervention.  Baseline: Goal status:Met 09/08/24  2.  Pt will report return to [facility] to begin indep routine exerises Baseline: stopped Goal status: In process 09/08/24; 10/01/24  3.  Pt will report reduced waking at night due to pain Baseline:  Goal status: In progress 09/08/24; 10/01/24  4.  Pt will report ability to tolerate standing x >30 mins Baseline:  Goal status: In progress 09/08/24; 2/6/ 26    LONG TERM GOALS: Target date: 10/29/24  Pt to improve on ODI by 13% to demonstrate statistically significant Improvement in function. (MCID 13-15%) Baseline: 28/50=56% Goal status: INITIAL  2.  Pt will improve strength in all areas listed by at least 5 lbs to demonstrate improved overall physical function Baseline:  Goal status:Partially met 10/01/24  3.  Pt will report decrease in pain by at least 50% for improved toleration to activity/quality of life and to demonstrate improved management of pain. Baseline:  Goal status: IN PROGRESS (40-50%) 09/22/24; 10/02/23  4.  Pt will report return to [facility] and routine exercise regimen Baseline:  Goal status: In progress 2/6  5.  Pt will be indep with final HEP's (land and aquatic as appropriate) for continued management of condition Baseline:  Goal status: Partially met (in aquatics) 10/01/24    PLAN:  PT FREQUENCY: 1x   PT DURATION:4 weeks  PLANNED INTERVENTIONS: 97164- PT Re-evaluation, 97750- Physical Performance Testing, 97110-Therapeutic exercises, 97530- Therapeutic activity, 97112- Neuromuscular re-education, 97535- Self Care, 02859- Manual therapy, [id number]- Gait training, [phone]-  Aquatic Therapy, [phone]- Electrical stimulation (unattended), [phone]- Electrical stimulation (manual), 20560 (1-2 muscles), 20561 (3+ muscles)- Dry Needling, Patient/Family education, Balance training, Stair training, Taping, Joint mobilization, DME instructions, Cryotherapy, and Moist heat.  PLAN FOR NEXT SESSION: Land: core and LE strengthening; stretching and positioning for pain relief/management,   Ronal Foots) Omair Dettmer MPT 10/01/24 8:18 AM [facility] [address] [place], [place], [address] Phone: [phone]   Fax:  [phone]     "

## 2024-10-04 ENCOUNTER — Ambulatory Visit (HOSPITAL_BASED_OUTPATIENT_CLINIC_OR_DEPARTMENT_OTHER): Admitting: Physical Therapy

## 2024-10-06 ENCOUNTER — Encounter (HOSPITAL_BASED_OUTPATIENT_CLINIC_OR_DEPARTMENT_OTHER): Admitting: Physical Therapy

## 2024-10-26 ENCOUNTER — Encounter (HOSPITAL_BASED_OUTPATIENT_CLINIC_OR_DEPARTMENT_OTHER): Payer: Self-pay
